# Patient Record
Sex: Female | Born: 1935
Health system: Southern US, Community
[De-identification: ages and names within clinical notes are randomized; demographics above are authoritative.]

## PROBLEM LIST (undated history)

## (undated) ENCOUNTER — Emergency Department (HOSPITAL_COMMUNITY): Admission: EM | Payer: PPO | Source: Home / Self Care

## (undated) DIAGNOSIS — I429 Cardiomyopathy, unspecified: Secondary | ICD-10-CM

## (undated) DIAGNOSIS — M21612 Bunion of left foot: Secondary | ICD-10-CM

## (undated) DIAGNOSIS — H3530 Unspecified macular degeneration: Secondary | ICD-10-CM

## (undated) DIAGNOSIS — D649 Anemia, unspecified: Secondary | ICD-10-CM

## (undated) DIAGNOSIS — M21611 Bunion of right foot: Secondary | ICD-10-CM

## (undated) DIAGNOSIS — Z789 Other specified health status: Secondary | ICD-10-CM

## (undated) DIAGNOSIS — F419 Anxiety disorder, unspecified: Secondary | ICD-10-CM

## (undated) DIAGNOSIS — F32A Depression, unspecified: Secondary | ICD-10-CM

## (undated) DIAGNOSIS — F329 Major depressive disorder, single episode, unspecified: Secondary | ICD-10-CM

## (undated) DIAGNOSIS — I509 Heart failure, unspecified: Secondary | ICD-10-CM

## (undated) DIAGNOSIS — I2510 Atherosclerotic heart disease of native coronary artery without angina pectoris: Secondary | ICD-10-CM

## (undated) DIAGNOSIS — M1990 Unspecified osteoarthritis, unspecified site: Secondary | ICD-10-CM

## (undated) DIAGNOSIS — I340 Nonrheumatic mitral (valve) insufficiency: Secondary | ICD-10-CM

## (undated) DIAGNOSIS — C801 Malignant (primary) neoplasm, unspecified: Secondary | ICD-10-CM

## (undated) DIAGNOSIS — M810 Age-related osteoporosis without current pathological fracture: Secondary | ICD-10-CM

## (undated) DIAGNOSIS — K5790 Diverticulosis of intestine, part unspecified, without perforation or abscess without bleeding: Secondary | ICD-10-CM

## (undated) DIAGNOSIS — R011 Cardiac murmur, unspecified: Secondary | ICD-10-CM

## (undated) DIAGNOSIS — I10 Essential (primary) hypertension: Secondary | ICD-10-CM

## (undated) DIAGNOSIS — T148XXA Other injury of unspecified body region, initial encounter: Secondary | ICD-10-CM

## (undated) DIAGNOSIS — I219 Acute myocardial infarction, unspecified: Secondary | ICD-10-CM

## (undated) DIAGNOSIS — S83206A Unspecified tear of unspecified meniscus, current injury, right knee, initial encounter: Secondary | ICD-10-CM

## (undated) DIAGNOSIS — E785 Hyperlipidemia, unspecified: Secondary | ICD-10-CM

## (undated) DIAGNOSIS — N390 Urinary tract infection, site not specified: Secondary | ICD-10-CM

## (undated) HISTORY — DX: Diverticulosis of intestine, part unspecified, without perforation or abscess without bleeding: K57.90

## (undated) HISTORY — DX: Major depressive disorder, single episode, unspecified: F32.9

## (undated) HISTORY — DX: Depression, unspecified: F32.A

## (undated) HISTORY — DX: Bunion of left foot: M21.612

## (undated) HISTORY — PX: SKIN CANCER EXCISION: SHX779

## (undated) HISTORY — DX: Age-related osteoporosis without current pathological fracture: M81.0

## (undated) HISTORY — DX: Bunion of right foot: M21.611

---

## 1978-01-30 HISTORY — PX: ABDOMINAL HYSTERECTOMY: SHX81

## 1984-01-31 HISTORY — PX: LUMBAR LAMINECTOMY: SHX95

## 1984-01-31 HISTORY — PX: BACK SURGERY: SHX140

## 1986-01-30 HISTORY — PX: ABDOMINAL HYSTERECTOMY: SHX81

## 2000-11-02 ENCOUNTER — Ambulatory Visit (HOSPITAL_COMMUNITY): Admission: RE | Admit: 2000-11-02 | Discharge: 2000-11-02 | Payer: Self-pay | Admitting: *Deleted

## 2003-08-24 ENCOUNTER — Ambulatory Visit (HOSPITAL_COMMUNITY): Admission: RE | Admit: 2003-08-24 | Discharge: 2003-08-24 | Payer: Self-pay | Admitting: Family Medicine

## 2005-05-29 ENCOUNTER — Other Ambulatory Visit: Admission: RE | Admit: 2005-05-29 | Discharge: 2005-05-29 | Payer: Self-pay | Admitting: Family Medicine

## 2005-09-18 ENCOUNTER — Emergency Department (HOSPITAL_COMMUNITY): Admission: EM | Admit: 2005-09-18 | Discharge: 2005-09-18 | Payer: Self-pay | Admitting: Emergency Medicine

## 2009-05-25 ENCOUNTER — Encounter: Admission: RE | Admit: 2009-05-25 | Discharge: 2009-05-25 | Payer: Self-pay | Admitting: Family Medicine

## 2011-01-31 DIAGNOSIS — T148XXA Other injury of unspecified body region, initial encounter: Secondary | ICD-10-CM

## 2011-01-31 HISTORY — PX: OTHER SURGICAL HISTORY: SHX169

## 2011-01-31 HISTORY — DX: Other injury of unspecified body region, initial encounter: T14.8XXA

## 2011-06-28 ENCOUNTER — Other Ambulatory Visit (HOSPITAL_COMMUNITY): Payer: Self-pay | Admitting: *Deleted

## 2011-06-28 ENCOUNTER — Other Ambulatory Visit: Payer: Self-pay | Admitting: Family Medicine

## 2011-07-11 ENCOUNTER — Encounter (HOSPITAL_COMMUNITY): Payer: Self-pay

## 2011-07-13 ENCOUNTER — Encounter (HOSPITAL_COMMUNITY)
Admission: RE | Admit: 2011-07-13 | Discharge: 2011-07-13 | Disposition: A | Discharge status: Home / Self Care | Payer: Medicare Other | Source: Ambulatory Visit | Attending: Family Medicine | Admitting: Family Medicine

## 2011-07-13 ENCOUNTER — Encounter (HOSPITAL_COMMUNITY): Payer: Self-pay

## 2011-07-13 DIAGNOSIS — M810 Age-related osteoporosis without current pathological fracture: Secondary | ICD-10-CM | POA: Insufficient documentation

## 2011-07-13 HISTORY — DX: Essential (primary) hypertension: I10

## 2011-07-13 MED ORDER — SODIUM CHLORIDE 0.9 % IV SOLN
INTRAVENOUS | Status: DC
  Administered 2011-07-13: 14:00:00 via INTRAVENOUS

## 2011-07-13 MED ORDER — ZOLEDRONIC ACID 5 MG/100ML IV SOLN
5.0000 mg | Freq: Once | INTRAVENOUS | Status: AC
  Administered 2011-07-13: 5 mg via INTRAVENOUS
  Filled 2011-07-13: qty 100

## 2011-07-13 NOTE — Progress Notes (Signed)
Patient given discharge instructions, drink fliud, take tylenol or motrin as needed

## 2011-07-13 NOTE — Discharge Instructions (Signed)
Zoledronic Acid injection (Paget's Disease, Osteoporosis) What is this medicine? ZOLEDRONIC ACID (ZOE le dron ik AS id) lowers the amount of calcium loss from bone. It is used to treat Paget's disease and osteoporosis in women. This medicine may be used for other purposes; ask your health care provider or pharmacist if you have questions. What should I tell my health care provider before I take this medicine? They need to know if you have any of these conditions: -aspirin-sensitive asthma -dental disease -kidney disease -low levels of calcium in the blood -past surgery on the parathyroid gland or intestines -an unusual or allergic reaction to zoledronic acid, other medicines, foods, dyes, or preservatives -pregnant or trying to get pregnant -breast-feeding How should I use this medicine? This medicine is for infusion into a vein. It is given by a health care professional in a hospital or clinic setting. Talk to your pediatrician regarding the use of this medicine in children. This medicine is not approved for use in children. Overdosage: If you think you have taken too much of this medicine contact a poison control center or emergency room at once. NOTE: This medicine is only for you. Do not share this medicine with others. What if I miss a dose? It is important not to miss your dose. Call your doctor or health care professional if you are unable to keep an appointment. What may interact with this medicine? -certain antibiotics given by injection -NSAIDs, medicines for pain and inflammation, like ibuprofen or naproxen -some diuretics like bumetanide, furosemide -teriparatide This list may not describe all possible interactions. Give your health care provider a list of all the medicines, herbs, non-prescription drugs, or dietary supplements you use. Also tell them if you smoke, drink alcohol, or use illegal drugs. Some items may interact with your medicine. What should I watch for while  using this medicine? Visit your doctor or health care professional for regular checkups. It may be some time before you see the benefit from this medicine. Do not stop taking your medicine unless your doctor tells you to. Your doctor may order blood tests or other tests to see how you are doing. Women should inform their doctor if they wish to become pregnant or think they might be pregnant. There is a potential for serious side effects to an unborn child. Talk to your health care professional or pharmacist for more information. You should make sure that you get enough calcium and vitamin D while you are taking this medicine. Discuss the foods you eat and the vitamins you take with your health care professional. Some people who take this medicine have severe bone, joint, and/or muscle pain. This medicine may also increase your risk for a broken thigh bone. Tell your doctor right away if you have pain in your upper leg or groin. Tell your doctor if you have any pain that does not go away or that gets worse. What side effects may I notice from receiving this medicine? Side effects that you should report to your doctor or health care professional as soon as possible: -allergic reactions like skin rash, itching or hives, swelling of the face, lips, or tongue -breathing problems -changes in vision -feeling faint or lightheaded, falls -jaw burning, cramping, or pain -muscle cramps, stiffness, or weakness -trouble passing urine or change in the amount of urine Side effects that usually do not require medical attention (report to your doctor or health care professional if they continue or are bothersome): -bone, joint, or muscle pain -fever -  irritation at site where injected -loss of appetite -nausea, vomiting -stomach upset -tired This list may not describe all possible side effects. Call your doctor for medical advice about side effects. You may report side effects to FDA at 1-800-FDA-1088. Where  should I keep my medicine? This drug is given in a hospital or clinic and will not be stored at home. NOTE: This sheet is a summary. It may not cover all possible information. If you have questions about this medicine, talk to your doctor, pharmacist, or health care provider.  2012, [employer]. (07/15/2010 9:08:15 AM) 

## 2012-03-08 ENCOUNTER — Encounter (HOSPITAL_COMMUNITY): Payer: Self-pay | Admitting: Emergency Medicine

## 2012-03-08 ENCOUNTER — Inpatient Hospital Stay (HOSPITAL_COMMUNITY)
Admission: EM | Admit: 2012-03-08 | Discharge: 2012-03-12 | DRG: 247 | Disposition: A | Discharge status: Home / Self Care | Payer: Medicare Other | Attending: Cardiology | Admitting: Cardiology

## 2012-03-08 ENCOUNTER — Emergency Department (HOSPITAL_COMMUNITY): Payer: Medicare Other

## 2012-03-08 DIAGNOSIS — I2789 Other specified pulmonary heart diseases: Secondary | ICD-10-CM | POA: Diagnosis present

## 2012-03-08 DIAGNOSIS — I272 Pulmonary hypertension, unspecified: Secondary | ICD-10-CM

## 2012-03-08 DIAGNOSIS — I214 Non-ST elevation (NSTEMI) myocardial infarction: Principal | ICD-10-CM | POA: Diagnosis present

## 2012-03-08 DIAGNOSIS — I340 Nonrheumatic mitral (valve) insufficiency: Secondary | ICD-10-CM

## 2012-03-08 DIAGNOSIS — R339 Retention of urine, unspecified: Secondary | ICD-10-CM

## 2012-03-08 DIAGNOSIS — I10 Essential (primary) hypertension: Secondary | ICD-10-CM | POA: Diagnosis present

## 2012-03-08 DIAGNOSIS — Z955 Presence of coronary angioplasty implant and graft: Secondary | ICD-10-CM

## 2012-03-08 DIAGNOSIS — F411 Generalized anxiety disorder: Secondary | ICD-10-CM | POA: Diagnosis present

## 2012-03-08 DIAGNOSIS — I429 Cardiomyopathy, unspecified: Secondary | ICD-10-CM

## 2012-03-08 DIAGNOSIS — Z79899 Other long term (current) drug therapy: Secondary | ICD-10-CM

## 2012-03-08 DIAGNOSIS — H3530 Unspecified macular degeneration: Secondary | ICD-10-CM | POA: Diagnosis present

## 2012-03-08 DIAGNOSIS — R079 Chest pain, unspecified: Secondary | ICD-10-CM

## 2012-03-08 DIAGNOSIS — Z7902 Long term (current) use of antithrombotics/antiplatelets: Secondary | ICD-10-CM

## 2012-03-08 DIAGNOSIS — Z7982 Long term (current) use of aspirin: Secondary | ICD-10-CM

## 2012-03-08 DIAGNOSIS — I2589 Other forms of chronic ischemic heart disease: Secondary | ICD-10-CM | POA: Diagnosis present

## 2012-03-08 DIAGNOSIS — Z882 Allergy status to sulfonamides status: Secondary | ICD-10-CM

## 2012-03-08 DIAGNOSIS — F419 Anxiety disorder, unspecified: Secondary | ICD-10-CM

## 2012-03-08 DIAGNOSIS — I059 Rheumatic mitral valve disease, unspecified: Secondary | ICD-10-CM | POA: Diagnosis present

## 2012-03-08 DIAGNOSIS — E785 Hyperlipidemia, unspecified: Secondary | ICD-10-CM

## 2012-03-08 DIAGNOSIS — I2510 Atherosclerotic heart disease of native coronary artery without angina pectoris: Secondary | ICD-10-CM | POA: Diagnosis present

## 2012-03-08 HISTORY — DX: Other specified health status: Z78.9

## 2012-03-08 HISTORY — DX: Urinary tract infection, site not specified: N39.0

## 2012-03-08 HISTORY — DX: Unspecified macular degeneration: H35.30

## 2012-03-08 HISTORY — DX: Cardiomyopathy, unspecified: I42.9

## 2012-03-08 HISTORY — DX: Hyperlipidemia, unspecified: E78.5

## 2012-03-08 HISTORY — DX: Anxiety disorder, unspecified: F41.9

## 2012-03-08 HISTORY — DX: Nonrheumatic mitral (valve) insufficiency: I34.0

## 2012-03-08 HISTORY — DX: Atherosclerotic heart disease of native coronary artery without angina pectoris: I25.10

## 2012-03-08 MED ORDER — NITROGLYCERIN 0.4 MG SL SUBL
0.4000 mg | SUBLINGUAL_TABLET | SUBLINGUAL | Status: DC | PRN
  Filled 2012-03-08: qty 25

## 2012-03-08 MED ORDER — MORPHINE SULFATE 4 MG/ML IJ SOLN
4.0000 mg | INTRAMUSCULAR | Status: DC | PRN
  Filled 2012-03-08 (×2): qty 1

## 2012-03-08 MED ORDER — ONDANSETRON HCL 4 MG/2ML IJ SOLN
4.0000 mg | Freq: Four times a day (QID) | INTRAMUSCULAR | Status: DC | PRN
  Filled 2012-03-08: qty 2

## 2012-03-08 MED ORDER — ASPIRIN 81 MG PO CHEW
162.0000 mg | CHEWABLE_TABLET | Freq: Once | ORAL | Status: DC
  Filled 2012-03-08: qty 4

## 2012-03-08 NOTE — ED Provider Notes (Signed)
History     CSN: [id number]  Arrival date & time 03/08/12  2322   First MD Initiated Contact with Patient 03/08/12 2332      Chief Complaint  Patient presents with  . Chest Pain    (Consider location/radiation/quality/duration/timing/severity/associated sxs/prior treatment) HPI HX per PT. Jaw pain bilateral and SOB onset while exercising around 6:30pm tonight, pain persistent eases with rest and worse with exertion, no h/o same, treated for HTN no known h/o CAD. Mod in severity, no CP, no nausea, no diaphoresis, no fevers, no leg pain or swelling.  Past Medical History  Diagnosis Date  . Hypertension     Past Surgical History  Procedure Date  . Abdominal hysterectomy   . Back surgery     No family history on file.  History  Substance Use Topics  . Smoking status: Not on file  . Smokeless tobacco: Not on file  . Alcohol Use:     OB History    Grav Para Term Preterm Abortions TAB SAB Ect Mult Living                  Review of Systems  Constitutional: Negative for fever and chills.  HENT: Negative for sore throat, trouble swallowing, neck pain, neck stiffness, dental problem and sinus pressure.   Eyes: Negative for pain.  Respiratory: Positive for shortness of breath.   Cardiovascular: Negative for palpitations and leg swelling.  Gastrointestinal: Negative for vomiting and abdominal pain.  Genitourinary: Negative for dysuria.  Musculoskeletal: Negative for back pain.  Skin: Negative for rash.  Neurological: Negative for headaches.  All other systems reviewed and are negative.    Allergies  Sulfa antibiotics  Home Medications   Current Outpatient Rx  Name  Route  Sig  Dispense  Refill  . ASCORBIC ACID 1000 MG PO TABS   Oral   Take 1,000 mg by mouth daily.         . ASPIRIN 81 MG PO TABS   Oral   Take 81 mg by mouth daily.         [name] VITAMIN D 1000 UNITS PO TABS   Oral   Take 2,000 Units by mouth daily.         . OMEGA-3 FATTY ACIDS 1000 MG  PO CAPS   Oral   Take 2 g by mouth daily.         [name] HYOSCYAMINE SULFATE 0.125 MG PO TBDP   Sublingual   Place 0.125 mg under the tongue every 6 (six) hours as needed. Before meals         . LISINOPRIL 5 MG PO TABS   Oral   Take 5 mg by mouth daily.         [name] ONE-DAILY MULTI VITAMINS PO TABS   Oral   Take 1 tablet by mouth daily.         [name] NITROFURANTOIN MONOHYD MACRO 100 MG PO CAPS   Oral   Take 100 mg by mouth every other day.           BP 131/87  Pulse 87  Temp 97.5 F (36.4 C) (Oral)  Resp 16  Ht 5\' 6"  (1.676 m)  Wt 160 lb (72.576 kg)  BMI 25.82 kg/m2  SpO2 99%  Physical Exam  Constitutional: She is oriented to person, place, and time. She appears well-developed and well-nourished.  HENT:  Head: Normocephalic and atraumatic.  Mouth/Throat: Oropharynx is clear and moist.       No  trismus  Eyes: Conjunctivae normal and EOM are normal. Pupils are equal, round, and reactive to light.  Neck: Trachea normal. Neck supple. No thyromegaly present.  Cardiovascular: Normal rate, regular rhythm, S1 normal, S2 normal and normal pulses.     No systolic murmur is present   No diastolic murmur is present  Pulses:      Radial pulses are 2+ on the right side, and 2+ on the left side.  Pulmonary/Chest: Effort normal and breath sounds normal. She has no wheezes. She has no rhonchi. She has no rales. She exhibits no tenderness.  Abdominal: Soft. Normal appearance and bowel sounds are normal. There is no tenderness. There is no CVA tenderness and negative Murphy's sign.  Musculoskeletal:       BLE:s Calves nontender, no cords or erythema, negative Homans sign  Neurological: She is alert and oriented to person, place, and time. She has normal strength. No cranial nerve deficit or sensory deficit. GCS eye subscore is 4. GCS verbal subscore is 5. GCS motor subscore is 6.  Skin: Skin is warm and dry. No rash noted. She is not diaphoretic.  Psychiatric: Her speech is normal.        Cooperative and appropriate    ED Course  Procedures (including critical care time)  Results for orders placed during the hospital encounter of 03/08/12  CBC      Result Value Range   WBC 9.5  4.0 - 10.5 K/uL   RBC 4.31  3.87 - 5.11 MIL/uL   Hemoglobin 13.6  12.0 - 15.0 g/dL   HCT 16.1  09.6 - 04.5 %   MCV 92.8  78.0 - 100.0 fL   MCH 31.6  26.0 - 34.0 pg   MCHC 34.0  30.0 - 36.0 g/dL   RDW 40.9  81.1 - 91.4 %   Platelets 196  150 - 400 K/uL  PRO B NATRIURETIC PEPTIDE      Result Value Range   Pro B Natriuretic peptide (BNP) 199.3  0 - 450 pg/mL  BASIC METABOLIC PANEL      Result Value Range   Sodium 138  135 - 145 mEq/L   Potassium 4.0  3.5 - 5.1 mEq/L   Chloride 102  96 - 112 mEq/L   CO2 24  19 - 32 mEq/L   Glucose, Bld 109 (*) 70 - 99 mg/dL   BUN 14  6 - 23 mg/dL   Creatinine, Ser 7.82  0.50 - 1.10 mg/dL   Calcium 9.1  8.4 - 95.6 mg/dL   GFR calc non Af Amer 58 (*) >90 mL/min   GFR calc Af Amer 67 (*) >90 mL/min  PROTIME-INR      Result Value Range   Prothrombin Time 11.5 (*) 11.6 - 15.2 seconds   INR 0.84  0.00 - 1.49  TROPONIN I      Result Value Range   Troponin I 1.96 (*) <0.30 ng/mL  POCT I-STAT, CHEM 8      Result Value Range   Sodium 140  135 - 145 mEq/L   Potassium 3.9  3.5 - 5.1 mEq/L   Chloride 108  96 - 112 mEq/L   BUN 15  6 - 23 mg/dL   Creatinine, Ser 2.13  0.50 - 1.10 mg/dL   Glucose, Bld 086 (*) 70 - 99 mg/dL   Calcium, Ion 5.78  4.69 - 1.30 mmol/L   TCO2 23  0 - 100 mmol/L   Hemoglobin 13.3  12.0 - 15.0 g/dL  HCT 39.0  36.0 - 46.0 %  POCT I-STAT TROPONIN I      Result Value Range   Troponin i, poc 0.64 (*) 0.00 - 0.08 ng/mL   Comment NOTIFIED PHYSICIAN     Comment 3            Dg Chest 1 View  03/09/2012  *RADIOLOGY REPORT*  Clinical Data: Chest pain  CHEST - 1 VIEW  Comparison: 08/24/2003  Findings: Chronic interstitial markings/emphysematous changes.  No focal consolidation.  No pleural effusion or pneumothorax.  The heart is top  normal in size.  IMPRESSION: No evidence of acute cardiopulmonary disease.   Original Report Authenticated By: Charline Bills, M.D.    CRITICAL CARE Performed by: Sunnie Nielsen   Total critical care time: 30  Critical care time was exclusive of separately billable procedures and treating other patients.  Critical care was necessary to treat or prevent imminent or life-threatening deterioration.  Critical care was time spent personally by me on the following activities: development of treatment plan with patient and/or surrogate as well as nursing, discussions with consultants, evaluation of patient's response to treatment, examination of patient, obtaining history from patient or surrogate, ordering and performing treatments and interventions, ordering and review of laboratory studies, ordering and review of radiographic studies, pulse oximetry and re-evaluation of patient's condition. ASA. IV Heparin. CR consult and Tx to Cardiac center      Date: 03/08/2012  Rate: 92  Rhythm: normal sinus rhythm  QRS Axis: normal  Intervals: normal  ST/T Wave abnormalities: nonspecific ST changes  Conduction Disutrbances:none  Narrative Interpretation:   Old EKG Reviewed: none available  ASA. Pain resolved with NTG. Troponin reviewed - d/w DR Hochrein who accepts in Tx to [facility].   PLAN ADMIT  - NSTEMI MDM   Elderly female with presentation concerning for unstable angina/ ACS.   ASA. NTG. Cardiac monitoring. IV. O2. labs. CXR. Plan Admit for ACS symptoms.      Sunnie Nielsen, MD 03/09/12 [phone]

## 2012-03-08 NOTE — ED Notes (Signed)
Pt states after exercise this evening @ 1830 pt began having central CP and lower bilat jaw pain with SHOB and lightheadedness. Pt recently lost husband.

## 2012-03-09 ENCOUNTER — Encounter (HOSPITAL_COMMUNITY): Payer: Self-pay | Admitting: Cardiology

## 2012-03-09 DIAGNOSIS — I214 Non-ST elevation (NSTEMI) myocardial infarction: Secondary | ICD-10-CM | POA: Diagnosis present

## 2012-03-09 LAB — POCT I-STAT, CHEM 8
BUN: 15 mg/dL (ref 6–23)
Calcium, Ion: 1.15 mmol/L (ref 1.13–1.30)
Chloride: 108 mEq/L (ref 96–112)
HCT: 39 % (ref 36.0–46.0)
TCO2: 23 mmol/L (ref 0–100)

## 2012-03-09 LAB — CBC
HCT: 36.5 % (ref 36.0–46.0)
Hemoglobin: 12.4 g/dL (ref 12.0–15.0)
Hemoglobin: 13.6 g/dL (ref 12.0–15.0)
MCH: 31.6 pg (ref 26.0–34.0)
MCHC: 34 g/dL (ref 30.0–36.0)
RBC: 3.93 MIL/uL (ref 3.87–5.11)
RDW: 13.4 % (ref 11.5–15.5)
WBC: 9.5 10*3/uL (ref 4.0–10.5)

## 2012-03-09 LAB — CBC WITH DIFFERENTIAL/PLATELET
Basophils Absolute: 0.1 10*3/uL (ref 0.0–0.1)
Basophils Relative: 1 % (ref 0–1)
Eosinophils Absolute: 0.3 10*3/uL (ref 0.0–0.7)
Eosinophils Relative: 3 % (ref 0–5)
HCT: 38.4 % (ref 36.0–46.0)
MCH: 31.3 pg (ref 26.0–34.0)
MCHC: 33.3 g/dL (ref 30.0–36.0)
Monocytes Absolute: 0.6 10*3/uL (ref 0.1–1.0)
Monocytes Relative: 8 % (ref 3–12)
Neutro Abs: 4.2 10*3/uL (ref 1.7–7.7)
RDW: 13.6 % (ref 11.5–15.5)

## 2012-03-09 LAB — BASIC METABOLIC PANEL
Calcium: 9.1 mg/dL (ref 8.4–10.5)
GFR calc non Af Amer: 58 mL/min — ABNORMAL LOW (ref >90)

## 2012-03-09 LAB — PRO B NATRIURETIC PEPTIDE: Pro B Natriuretic peptide (BNP): 199.3 pg/mL (ref 0–450)

## 2012-03-09 LAB — PROTIME-INR
INR: 0.84 (ref 0.00–1.49)
Prothrombin Time: 11.5 seconds — ABNORMAL LOW (ref 11.6–15.2)

## 2012-03-09 LAB — HEPARIN LEVEL (UNFRACTIONATED)
Heparin Unfractionated: 0.53 IU/mL (ref 0.30–0.70)
Heparin Unfractionated: 0.28 IU/mL — ABNORMAL LOW (ref 0.30–0.70)

## 2012-03-09 LAB — POCT I-STAT TROPONIN I: Troponin i, poc: 0.64 ng/mL (ref 0.00–0.08)

## 2012-03-09 MED ORDER — SODIUM CHLORIDE 0.9 % IJ SOLN: 3.0000 mL | INTRAMUSCULAR | Status: DC | PRN

## 2012-03-09 MED ORDER — SODIUM CHLORIDE 0.9 % IV SOLN: 250.0000 mL | INTRAVENOUS | Status: DC | PRN

## 2012-03-09 MED ORDER — SODIUM CHLORIDE 0.9 % IJ SOLN: 3.0000 mL | Freq: Two times a day (BID) | INTRAMUSCULAR | Status: DC

## 2012-03-09 MED ORDER — HEPARIN BOLUS VIA INFUSION: 4000.0000 [IU] | Freq: Once | INTRAVENOUS | Status: DC

## 2012-03-09 MED ORDER — HEPARIN (PORCINE) IN NACL 100-0.45 UNIT/ML-% IJ SOLN
1100.0000 [IU]/h | INTRAMUSCULAR | Status: DC
  Administered 2012-03-09 (×2): 1100 [IU]/h via INTRAVENOUS
  Filled 2012-03-09 (×4): qty 250

## 2012-03-09 MED ORDER — NITROGLYCERIN 0.4 MG SL SUBL: 0.4000 mg | SUBLINGUAL_TABLET | SUBLINGUAL | Status: DC | PRN

## 2012-03-09 MED ORDER — ATORVASTATIN CALCIUM 80 MG PO TABS
80.0000 mg | ORAL_TABLET | Freq: Every day | ORAL | Status: DC
  Administered 2012-03-09 – 2012-03-11 (×3): 80 mg via ORAL
  Filled 2012-03-09 (×4): qty 1

## 2012-03-09 MED ORDER — METOPROLOL TARTRATE 25 MG PO TABS
25.0000 mg | ORAL_TABLET | Freq: Two times a day (BID) | ORAL | Status: DC
  Administered 2012-03-09 – 2012-03-12 (×6): 25 mg via ORAL
  Filled 2012-03-09 (×8): qty 1

## 2012-03-09 MED ORDER — ACETAMINOPHEN 325 MG PO TABS
650.0000 mg | ORAL_TABLET | ORAL | Status: DC | PRN
  Administered 2012-03-09 – 2012-03-11 (×2): 650 mg via ORAL
  Filled 2012-03-09 (×2): qty 2

## 2012-03-09 NOTE — H&P (Signed)
CARDIOLOGY ADMISSION NOTE  Patient ID: Erin Roth MRN: [id number] DOB/AGE: 1933/05/24 79 y.o.  Admit date: 03/08/2012 Primary Physician   Cala Bradford, MD Primary Cardiologist   None Chief Complaint    Jaw pain  HPI:  The patient has no prior cardiac history. She has been under a lot of stress recently. Her husband died last month. She's only exercised a couple of since 2022-02-17 through last evening she was on her treadmill. She developed some jaw discomfort and some mid chest discomfort. This was 2/10 in intensity. It was heavy.  She was slightly lightheaded.  There were no associated symptoms such as nausea or diaphoresis. She did not have shortness of breath. She had no palpitations, presyncope or syncope. She presented to the emergency room where she was treated with heparin aspirin and sublingual nitroglycerin with resolution of her symptoms. She had no acute ST segment elevation diagnostic. Her troponin did come back mildly elevated. There was perhaps some minimal anterior ST change. She otherwise does not have any acute complaints. She doesn't typically get chest discomfort. She does not describe any PND or orthopnea.   Past Medical History  Diagnosis Date  . Hypertension   . Self-catheterizes urinary bladder   . UTI (lower urinary tract infection)   . Macular degeneration     Past Surgical History  Procedure Laterality Date  . Abdominal hysterectomy    . Back surgery      Allergies  Allergen Reactions  . Sulfa Antibiotics Rash   No current facility-administered medications on file prior to encounter.   Current Outpatient Prescriptions on File Prior to Encounter  Medication Sig Dispense Refill  . ascorbic acid (VITAMIN C) 1000 MG tablet Take 1,000 mg by mouth every morning.       [name] aspirin 81 MG tablet Take 162 mg by mouth every morning.       . cholecalciferol (VITAMIN D) 1000 UNITS tablet Take 2,000 Units by mouth every morning.       . fish oil-omega-3 fatty acids  1000 MG capsule Take 2 g by mouth every morning.       [name] lisinopril (PRINIVIL,ZESTRIL) 5 MG tablet Take 5 mg by mouth every morning.        History   Social History  . Marital Status: Married    Spouse Name: N/A    Number of Children: N/A  . Years of Education: N/A   Occupational History  . Not on file.   Social History Main Topics  . Smoking status: Never Smoker   . Smokeless tobacco: Not on file  . Alcohol Use: Yes     Comment: occasional  . Drug Use: No  . Sexually Active:    Other Topics Concern  . Not on file   Social History Narrative   Her husband died in 02-17-2022 of this year. She has four children.     Family History  Problem Relation Age of Onset  . Colon cancer Father   . Hypertension Mother   . CAD Brother 72    CABG    ROS:  Positive for dystonic bladder requiring self catheterizations. Otherwise as stated in the HPI and negative for all other systems.  Physical Exam: Blood pressure 104/90, pulse 85, temperature 97.5 F (36.4 C), temperature source Oral, resp. rate 11, height 5\' 6"  (1.676 m), weight 160 lb (72.576 kg), SpO2 99.00%.  GENERAL:  Well appearing HEENT:  Pupils equal round and reactive, fundi not visualized, oral mucosa unremarkable NECK:  No jugular venous distention, waveform within normal limits, carotid upstroke brisk and symmetric, no bruits, no thyromegaly LYMPHATICS:  No cervical, inguinal adenopathy LUNGS:  Clear to auscultation bilaterally BACK:  No CVA tenderness CHEST:  Unremarkable HEART:  PMI not displaced or sustained,S1 and S2 within normal limits, no S3, no S4, no clicks, no rubs, 6 apical systolic murmur nonradiating and early peaking, no diastolic murmurs ABD:  Flat, positive bowel sounds normal in frequency in pitch, no bruits, no rebound, no guarding, no midline pulsatile mass, no hepatomegaly, no splenomegaly EXT:  2 plus pulses throughout, no edema, no cyanosis no clubbing SKIN:  No rashes no nodules NEURO:  Cranial  nerves II through XII grossly intact, motor grossly intact throughout PSYCH:  Cognitively intact, oriented to person place and time  Labs: Lab Results  Component Value Date   BUN 15 03/09/2012   Lab Results  Component Value Date   CREATININE 1.00 03/09/2012   Lab Results  Component Value Date   NA 140 03/09/2012   K 3.9 03/09/2012   CL 108 03/09/2012   CO2 24 03/08/2012   Lab Results  Component Value Date   TROPONINI 1.96* 03/08/2012   Lab Results  Component Value Date   WBC 9.5 03/08/2012   HGB 13.3 03/09/2012   HCT 39.0 03/09/2012   MCV 92.8 03/08/2012   PLT 196 03/08/2012     Radiology:  CXR:  No evidence of acute cardiopulmonary disease.   EKG:    Normal sinus rhythm, rate 92, left knee enlargement addition, minimal voltage leads V2 - V5  ASSESSMENT AND PLAN:    NQWMI:  The patient is currently pain-free. She will remain on heparin. I will start a low dose beta blocker. She will be treated with aspirin. I will continue her previous medications. She will be left heart catheterization. The patient understands that risks included but are not limited to stroke (1 in 1000), death (1 in 1000), kidney failure [usually temporary] (1 in 500), bleeding (1 in 200), allergic reaction [possibly serious] (1 in 200).  The patient understands and agrees to proceed.   Of note this would be a classic situation for  Takatsubo's.  Of note I will hold the ACE as her BP is borderline.  Murmur:  I suspect aortic sclerosis and I will order an echocardiogram.    Signed: Rollene Rotunda 03/09/2012, 6:31 AM

## 2012-03-09 NOTE — Progress Notes (Signed)
  Echocardiogram 2D Echocardiogram has been performed.  Barbar Brede FRANCES 03/09/2012, 11:43 AM

## 2012-03-09 NOTE — Progress Notes (Signed)
ANTICOAGULATION CONSULT NOTE - Follow Up Consult  Pharmacy Consult for Heparin Indication: chest pain/ACS  Allergies  Allergen Reactions  . Sulfa Antibiotics Rash   Labs:  Recent Labs  03/08/12 2340  03/08/12 2348 03/09/12 0006 03/09/12 0245 03/09/12 0915  HGB  --   < > 13.6 13.3 12.4 12.8  HCT  --   < > 40.0 39.0 36.5 38.4  PLT  --   --  196  --  178 160  LABPROT  --   --  11.5*  --   --   --   INR  --   --  0.84  --   --   --   HEPARINUNFRC  --   --   --   --  0.53 0.28*  CREATININE  --   --  0.93 1.00  --   --   TROPONINI 1.96*  --   --   --  1.71*  --   < > = values in this interval not displayed.  Estimated Creatinine Clearance: 48 ml/min (by C-G formula based on Cr of 1).  Assessment: 79 year old admitted with CP.  HL = 0.28  Goal of Therapy:  Heparin level 0.3-0.7 units/ml Monitor platelets by anticoagulation protocol: Yes   Plan:  1) Increase heparin to 1100 units / hr 2) Follow up AM labs  Thank you. Okey Regal, PharmD [phone]  03/09/2012,2:04 PM

## 2012-03-09 NOTE — Progress Notes (Signed)
Pt arrived to unit at 0145 (during system downtime) via [facility]. Pt denies pain and SOB. VSS, daughter at bedside, pt resting. Will continue to monitor.

## 2012-03-10 LAB — CBC
HCT: 35.6 % — ABNORMAL LOW (ref 36.0–46.0)
Hemoglobin: 12 g/dL (ref 12.0–15.0)
MCV: 94.2 fL (ref 78.0–100.0)
WBC: 6.3 10*3/uL (ref 4.0–10.5)

## 2012-03-10 LAB — LIPID PANEL
Cholesterol: 179 mg/dL (ref 0–200)
LDL Cholesterol: 103 mg/dL — ABNORMAL HIGH (ref 0–99)
Total CHOL/HDL Ratio: 3.7 RATIO
Triglycerides: 141 mg/dL (ref <150)
VLDL: 28 mg/dL (ref 0–40)

## 2012-03-10 LAB — BASIC METABOLIC PANEL
BUN: 12 mg/dL (ref 6–23)
CO2: 21 mEq/L (ref 19–32)
Chloride: 111 mEq/L (ref 96–112)
GFR calc Af Amer: 79 mL/min — ABNORMAL LOW (ref >90)
Potassium: 3.8 mEq/L (ref 3.5–5.1)

## 2012-03-10 MED ORDER — SODIUM CHLORIDE 0.9 % IV SOLN: 250.0000 mL | INTRAVENOUS | Status: DC | PRN

## 2012-03-10 MED ORDER — ASPIRIN 81 MG PO CHEW
324.0000 mg | CHEWABLE_TABLET | ORAL | Status: AC
  Administered 2012-03-11: 324 mg via ORAL
  Filled 2012-03-10: qty 4

## 2012-03-10 MED ORDER — NITROFURANTOIN MONOHYD MACRO 100 MG PO CAPS
100.0000 mg | ORAL_CAPSULE | Freq: Every day | ORAL | Status: DC
  Administered 2012-03-10 – 2012-03-11 (×2): 100 mg via ORAL
  Filled 2012-03-10 (×3): qty 1

## 2012-03-10 NOTE — Progress Notes (Signed)
ANTICOAGULATION CONSULT NOTE - Follow Up Consult  Pharmacy Consult for Heparin Indication: chest pain/ACS  Allergies  Allergen Reactions  . Sulfa Antibiotics Rash   Labs:  Recent Labs  03/08/12 2340  03/08/12 2348 03/09/12 0006 03/09/12 0245 03/09/12 0915 03/10/12 0605  HGB  --   < > 13.6 13.3 12.4 12.8 12.0  HCT  --   < > 40.0 39.0 36.5 38.4 35.6*  PLT  --   < > 196  --  178 160 145*  LABPROT  --   --  11.5*  --   --   --   --   INR  --   --  0.84  --   --   --   --   HEPARINUNFRC  --   --   --   --  0.53 0.28* 0.48  CREATININE  --   --  0.93 1.00  --   --  0.81  TROPONINI 1.96*  --   --   --  1.71*  --   --   < > = values in this interval not displayed.  Estimated Creatinine Clearance: 59.3 ml/min (by C-G formula based on Cr of 0.81).  Assessment: 79 year old admitted with CP.  HL = 0.48, CBC stable  Cardiac cath planned for Monday  Goal of Therapy:  Heparin level 0.3-0.7 units/ml Monitor platelets by anticoagulation protocol: Yes   Plan:  1) Continue heparin at 1100 units / hr 2) Follow up AM labs  Thank you. Okey Regal, PharmD [phone]  03/10/2012,11:26 AM

## 2012-03-10 NOTE — Progress Notes (Signed)
Patient ID: Lonni Kemmer, female   DOB: 07/10/1933, 79 y.o.   MRN: [id number]   Patient Name: Erin Roth Date of Encounter: 03/10/2012    SUBJECTIVE  No further chest pain. Wants to get her antibiotic every other night her bladder. Also not happy with food. Daughter at the bedside.  CURRENT MEDS . atorvastatin  80 mg Oral q1800  . heparin  4,000 Units Intravenous Once  . metoprolol tartrate  25 mg Oral BID    OBJECTIVE  Filed Vitals:   03/09/12 2005 03/09/12 2038 03/09/12 2123 03/10/12 0449  BP: 84/47 104/64 96/66 108/64  Pulse: 78  72 64  Temp: 98.3 F (36.8 C)   97.4 F (36.3 C)  TempSrc: Oral   Oral  Resp: 18   19  Height:      Weight:      SpO2: 93%   95%    Intake/Output Summary (Last 24 hours) at 03/10/12 0916 Last data filed at 03/10/12 0454  Gross per 24 hour  Intake   1932 ml  Output   1800 ml  Net    132 ml   Filed Weights   03/08/12 2336  Weight: 160 lb (72.576 kg)    PHYSICAL EXAM  General: Pleasant, NAD. Neuro: Alert and oriented X 3. Moves all extremities spontaneously. Psych: Normal affect. HEENT:  Normal  Neck: Supple without bruits or JVD. Lungs:  Resp regular and unlabored, CTA. Heart: RRR no s3, s4, or murmurs. Abdomen: Soft, non-tender, non-distended, BS + x 4.  Extremities: No clubbing, cyanosis or edema. DP/PT/Radials 2+ and equal bilaterally.  Accessory Clinical Findings  CBC  Recent Labs  03/09/12 0245 03/09/12 0915 03/10/12 0605  WBC 9.5 7.7 6.3  NEUTROABS  --  4.2  --   HGB 12.4 12.8 12.0  HCT 36.5 38.4 35.6*  MCV 92.9 93.9 94.2  PLT 178 160 145*   Basic Metabolic Panel  Recent Labs  03/08/12 2348 03/09/12 0006 03/10/12 0605  NA 138 140 141  K 4.0 3.9 3.8  CL 102 108 111  CO2 24  --  21  GLUCOSE 109* 110* 88  BUN 14 15 12  CREATININE 0.93 1.00 0.81  CALCIUM 9.1  --  8.8   Liver Function Tests No results found for this basename: AST, ALT, ALKPHOS, BILITOT, PROT, ALBUMIN,  in the last 72 hours No results  found for this basename: LIPASE, AMYLASE,  in the last 72 hours Cardiac Enzymes  Recent Labs  03/08/12 2340 03/09/12 0245  TROPONINI 1.96* 1.71*   BNP No components found with this basename: POCBNP,  D-Dimer No results found for this basename: DDIMER,  in the last 72 hours Hemoglobin A1C No results found for this basename: HGBA1C,  in the last 72 hours Fasting Lipid Panel  Recent Labs  03/10/12 0605  CHOL 179  HDL 48  LDLCALC 103*  TRIG 141  CHOLHDL 3.7   Thyroid Function Tests  Recent Labs  03/09/12 0915  TSH 1.681    TELE  Normal sinus rhythm  ECG  Normal sinus rhythm, unremarkable ST segment change.  Radiology/Studies  Dg Chest 1 View  03/09/2012  *RADIOLOGY REPORT*  Clinical Data: Chest pain  CHEST - 1 VIEW  Comparison: 08/24/2003  Findings: Chronic interstitial markings/emphysematous changes.  No focal consolidation.  No pleural effusion or pneumothorax.  The heart is top normal in size.  IMPRESSION: No evidence of acute cardiopulmonary disease.   Original Report Authenticated By: Sriyesh Krishnan, M.D.     ASSESSMENT   AND PLAN  Principal Problem:   NSTEMI (non-ST elevated myocardial infarction)    Echocardiogram shows EF of 45% with anterior septal apical and inferior septal hypokinesia consistent with Takosubo's cardiomyopathy. EKG unremarkable however. We'll continue with current medical therapy and plan on cardiac catheterization tomorrow. Discussed with patient and daughter. So    Signed, Xavius Spadafore MD 

## 2012-03-10 NOTE — Progress Notes (Signed)
Utilization Review Completed.Erin Roth T2/09/2012  

## 2012-03-11 ENCOUNTER — Encounter (HOSPITAL_COMMUNITY)
Admission: EM | Disposition: A | Discharge status: Home / Self Care | Payer: Self-pay | Source: Home / Self Care | Attending: Cardiology

## 2012-03-11 DIAGNOSIS — R079 Chest pain, unspecified: Secondary | ICD-10-CM

## 2012-03-11 DIAGNOSIS — I2510 Atherosclerotic heart disease of native coronary artery without angina pectoris: Secondary | ICD-10-CM

## 2012-03-11 DIAGNOSIS — I214 Non-ST elevation (NSTEMI) myocardial infarction: Secondary | ICD-10-CM

## 2012-03-11 HISTORY — PX: LEFT HEART CATHETERIZATION WITH CORONARY ANGIOGRAM: SHX5451

## 2012-03-11 HISTORY — PX: CORONARY ANGIOPLASTY WITH STENT PLACEMENT: SHX49

## 2012-03-11 LAB — POCT I-STAT 3, VENOUS BLOOD GAS (G3P V)
Bicarbonate: 22.8 mEq/L (ref 20.0–24.0)
O2 Saturation: 66 %
TCO2: 24 mmol/L (ref 0–100)
pCO2, Ven: 37.5 mmHg — ABNORMAL LOW (ref 45.0–50.0)
pH, Ven: 7.393 — ABNORMAL HIGH (ref 7.250–7.300)

## 2012-03-11 LAB — CBC
MCH: 31.8 pg (ref 26.0–34.0)
MCHC: 34.1 g/dL (ref 30.0–36.0)
MCV: 93.2 fL (ref 78.0–100.0)
Platelets: 154 10*3/uL (ref 150–400)
RDW: 13.4 % (ref 11.5–15.5)

## 2012-03-11 LAB — POCT I-STAT 3, ART BLOOD GAS (G3+)
pCO2 arterial: 34 mmHg — ABNORMAL LOW (ref 35.0–45.0)
pH, Arterial: 7.435 (ref 7.350–7.450)
pO2, Arterial: 63 mmHg — ABNORMAL LOW (ref 80.0–100.0)

## 2012-03-11 SURGERY — LEFT HEART CATHETERIZATION WITH CORONARY ANGIOGRAM
Anesthesia: LOCAL

## 2012-03-11 MED ORDER — ASPIRIN 81 MG PO CHEW
81.0000 mg | CHEWABLE_TABLET | Freq: Every day | ORAL | Status: DC
  Administered 2012-03-12: 81 mg via ORAL
  Filled 2012-03-11: qty 1

## 2012-03-11 MED ORDER — LISINOPRIL 5 MG PO TABS
5.0000 mg | ORAL_TABLET | Freq: Every morning | ORAL | Status: DC
  Administered 2012-03-11 – 2012-03-12 (×2): 5 mg via ORAL
  Filled 2012-03-11 (×2): qty 1

## 2012-03-11 MED ORDER — SODIUM CHLORIDE 0.9 % IV SOLN
INTRAVENOUS | Status: AC
  Administered 2012-03-11: 18:00:00 via INTRAVENOUS

## 2012-03-11 MED ORDER — BIVALIRUDIN 250 MG IV SOLR
INTRAVENOUS | Status: AC
  Filled 2012-03-11: qty 250

## 2012-03-11 MED ORDER — MIDAZOLAM HCL 2 MG/2ML IJ SOLN
INTRAMUSCULAR | Status: AC
  Filled 2012-03-11: qty 2

## 2012-03-11 MED ORDER — NITROGLYCERIN 1 MG/10 ML FOR IR/CATH LAB
INTRA_ARTERIAL | Status: AC
  Filled 2012-03-11: qty 10

## 2012-03-11 MED ORDER — FENTANYL CITRATE 0.05 MG/ML IJ SOLN
INTRAMUSCULAR | Status: AC
  Filled 2012-03-11: qty 2

## 2012-03-11 MED ORDER — HEPARIN (PORCINE) IN NACL 2-0.9 UNIT/ML-% IJ SOLN
INTRAMUSCULAR | Status: AC
  Filled 2012-03-11: qty 1500

## 2012-03-11 MED ORDER — TICAGRELOR 90 MG PO TABS
ORAL_TABLET | ORAL | Status: AC
  Administered 2012-03-11: 23:00:00 90 mg via ORAL
  Filled 2012-03-11: qty 2

## 2012-03-11 MED ORDER — ALPRAZOLAM 0.25 MG PO TABS
0.5000 mg | ORAL_TABLET | Freq: Once | ORAL | Status: AC
  Administered 2012-03-11: 0.5 mg via ORAL

## 2012-03-11 MED ORDER — ALPRAZOLAM 0.25 MG PO TABS
ORAL_TABLET | ORAL | Status: AC
  Filled 2012-03-11: qty 2

## 2012-03-11 MED ORDER — TICAGRELOR 90 MG PO TABS
90.0000 mg | ORAL_TABLET | Freq: Two times a day (BID) | ORAL | Status: DC
  Administered 2012-03-11 – 2012-03-12 (×2): 90 mg via ORAL
  Filled 2012-03-11 (×3): qty 1

## 2012-03-11 MED ORDER — LIDOCAINE HCL (PF) 1 % IJ SOLN
INTRAMUSCULAR | Status: AC
  Filled 2012-03-11: qty 30

## 2012-03-11 NOTE — CV Procedure (Signed)
Cardiac Catheterization Procedure Note  Name: Erin Roth MRN: [id number] DOB: [birth year]  Procedure: Right Heart Cath, Left Heart Cath, Selective Coronary Angiography, LV angiography, a drug-eluting stent placement to the mid LAD.  Indication: Non-ST elevation myocardial infarction and pulmonary hypertension.   Procedural Details: The right groin was prepped, draped, and anesthetized with 1% lidocaine. Using the modified Seldinger technique a 5 French sheath was placed in the right femoral artery and a 7 French sheath was placed in the right femoral vein. A Swan-Ganz catheter was used for the right heart catheterization. Standard protocol was followed for recording of right heart pressures and sampling of oxygen saturations. Fick cardiac output was calculated. Standard Judkins catheters were used for selective coronary angiography and left ventriculography. There were no immediate procedural complications. The patient was transferred to the post catheterization recovery area for further monitoring.  Procedural Findings: Hemodynamics RA 4  mmHg RV   44/4 PCWP  a 15  mmHg LV  158/13  mmHg . LVEDP:  AO 155/88  mmHg  Oxygen saturations: PA  66% AO  93%   Cardiac Output (Fick)  5.3   Cardiac Index (Fick)  2.87   Aortic Valve: Peak to Peak gradient: no significant gradient  Coronary angiography: Coronary dominance: Right    Left Main:   normal in size and free of significant disease.  Left Anterior Descending (LAD):   normal in size with minor irregularities in the proximal segment. In the midsegment, there is a discrete 95% hazy stenosis suggestive of plaque rupture. The rest of the vessel has no obstructive disease.   1st diagonal (D1):  Small in size with minor irregularities.   2nd diagonal (D2):   large in size with no significant disease.   3rd diagonal (D3):   very small in size.   Circumflex (LCx):   normal in size and nondominant. The vessel has minor  irregularities without evidence of obstructive disease. It gives 3 normal size obtuse marginal branches.   Right Coronary Artery: Normal in size and dominant. The vessel has minor irregularities without obstructive disease.    Left ventriculography: Left ventricular systolic function is  mildly reduced  , LVEF is estimated at  40  %, there is  no significant mitral regurgitation .  there is moderate hypokinesis of distal anterior, apical and distal inferior walls    PCI Procedural Details: The 5 French sheath was exchanged into a 23 cm 6 French sheath. Weight-based bivalirudin was given for anticoagulation. Once a therapeutic ACT was achieved, a 6 [place] XB 3.5 guide catheter was inserted.  A intuition coronary guidewire was used to cross the lesion.   The lesion was then stented with a 2.5 x 12 mm Xience expedition drug-eluting stent.  The stent was postdilated with a 2.75 noncompliant balloon.  Following PCI, there was 0% residual stenosis and TIMI-3 flow. Final angiography confirmed an excellent result. The patient tolerated the procedure well. There were no immediate procedural complications. Femoral hemostasis was achieved with manual pressure. The patient was transferred to the post catheterization recovery area for further monitoring.  PCI Data: Vessel - mid LAD/Segment - 13 Percent Stenosis (pre)  95% TIMI-flow 3 Stent 2.5 x 12 mm Xience drug-eluting stent Percent Stenosis (post) 0% TIMI-flow (post) 3  Final Conclusions:  1. Mildly elevated filling pressures with mild pulmonary hypertension. Normal cardiac output. 2. Severe one-vessel coronary artery disease with 95% mid LAD stenosis likely the culprit for non-ST elevation MI. 3. Mildly reduced LV systolic function.  4. Successful drug-eluting stent placement to the mid LAD  Recommendations:  Recommend dual antiplatelet therapy for at least one year. Continue medical therapy for CAD and cardiomyopathy.  Lorine Bears MD,  [facility] 03/11/2012, 8:54 AM

## 2012-03-11 NOTE — Interval H&P Note (Signed)
History and Physical Interval Note:  03/11/2012 7:36 AM  Erin Roth  has presented today for surgery, with the diagnosis of cp  The various methods of treatment have been discussed with the patient and family. After consideration of risks, benefits and other options for treatment, the patient has consented to  Procedure(s): Right and LEFT HEART CATHETERIZATION WITH CORONARY ANGIOGRAM (N/A) as a surgical intervention .  The patient's history has been reviewed, patient examined, no change in status, stable for surgery.  I have reviewed the patient's chart and labs.  Questions were answered to the patient's satisfaction.     Lorine Bears

## 2012-03-11 NOTE — H&P (View-Only) (Signed)
Patient ID: Erin Roth, female   DOB: 1933/12/05, 79 y.o.   MRN: [id number]   Patient Name: Erin Roth Date of Encounter: 03/10/2012    SUBJECTIVE  No further chest pain. Wants to get her antibiotic every other night her bladder. Also not happy with food. Daughter at the bedside.  CURRENT MEDS . atorvastatin  80 mg Oral q1800  . heparin  4,000 Units Intravenous Once  . metoprolol tartrate  25 mg Oral BID    OBJECTIVE  Filed Vitals:   03/09/12 2005 03/09/12 2038 03/09/12 2123 03/10/12 0449  BP: 84/47 104/64 96/66 108/64  Pulse: 78  72 64  Temp: 98.3 F (36.8 C)   97.4 F (36.3 C)  TempSrc: Oral   Oral  Resp: 18   19  Height:      Weight:      SpO2: 93%   95%    Intake/Output Summary (Last 24 hours) at 03/10/12 0916 Last data filed at 03/10/12 0454  Gross per 24 hour  Intake   1932 ml  Output   1800 ml  Net    132 ml   Filed Weights   03/08/12 2336  Weight: 160 lb (72.576 kg)    PHYSICAL EXAM  General: Pleasant, NAD. Neuro: Alert and oriented X 3. Moves all extremities spontaneously. Psych: Normal affect. HEENT:  Normal  Neck: Supple without bruits or JVD. Lungs:  Resp regular and unlabored, CTA. Heart: RRR no s3, s4, or murmurs. Abdomen: Soft, non-tender, non-distended, BS + x 4.  Extremities: No clubbing, cyanosis or edema. DP/PT/Radials 2+ and equal bilaterally.  Accessory Clinical Findings  CBC  Recent Labs  03/09/12 0245 03/09/12 0915 03/10/12 0605  WBC 9.5 7.7 6.3  NEUTROABS  --  4.2  --   HGB 12.4 12.8 12.0  HCT 36.5 38.4 35.6*  MCV 92.9 93.9 94.2  PLT 178 160 145*   Basic Metabolic Panel  Recent Labs  03/08/12 2348 03/09/12 0006 03/10/12 0605  NA 138 140 141  K 4.0 3.9 3.8  CL 102 108 111  CO2 24  --  21  GLUCOSE 109* 110* 88  BUN 14 15 12   CREATININE 0.93 1.00 0.81  CALCIUM 9.1  --  8.8   Liver Function Tests No results found for this basename: AST, ALT, ALKPHOS, BILITOT, PROT, ALBUMIN,  in the last 72 hours No results  found for this basename: LIPASE, AMYLASE,  in the last 72 hours Cardiac Enzymes  Recent Labs  03/08/12 2340 03/09/12 0245  TROPONINI 1.96* 1.71*   BNP No components found with this basename: POCBNP,  D-Dimer No results found for this basename: DDIMER,  in the last 72 hours Hemoglobin A1C No results found for this basename: HGBA1C,  in the last 72 hours Fasting Lipid Panel  Recent Labs  03/10/12 0605  CHOL 179  HDL 48  LDLCALC 103*  TRIG 141  CHOLHDL 3.7   Thyroid Function Tests  Recent Labs  03/09/12 0915  TSH 1.681    TELE  Normal sinus rhythm  ECG  Normal sinus rhythm, unremarkable ST segment change.  Radiology/Studies  Dg Chest 1 View  03/09/2012  *RADIOLOGY REPORT*  Clinical Data: Chest pain  CHEST - 1 VIEW  Comparison: 08/24/2003  Findings: Chronic interstitial markings/emphysematous changes.  No focal consolidation.  No pleural effusion or pneumothorax.  The heart is top normal in size.  IMPRESSION: No evidence of acute cardiopulmonary disease.   Original Report Authenticated By: Charline Bills, M.D.     ASSESSMENT  AND PLAN  Principal Problem:   NSTEMI (non-ST elevated myocardial infarction)    Echocardiogram shows EF of 45% with anterior septal apical and inferior septal hypokinesia consistent with Takosubo's cardiomyopathy. EKG unremarkable however. We'll continue with current medical therapy and plan on cardiac catheterization tomorrow. Discussed with patient and daughter. So    Signed, Valera Castle MD

## 2012-03-12 ENCOUNTER — Encounter (HOSPITAL_COMMUNITY): Payer: Self-pay | Admitting: Physician Assistant

## 2012-03-12 DIAGNOSIS — I272 Pulmonary hypertension, unspecified: Secondary | ICD-10-CM

## 2012-03-12 DIAGNOSIS — I429 Cardiomyopathy, unspecified: Secondary | ICD-10-CM

## 2012-03-12 DIAGNOSIS — R339 Retention of urine, unspecified: Secondary | ICD-10-CM

## 2012-03-12 DIAGNOSIS — I10 Essential (primary) hypertension: Secondary | ICD-10-CM

## 2012-03-12 DIAGNOSIS — I2510 Atherosclerotic heart disease of native coronary artery without angina pectoris: Secondary | ICD-10-CM

## 2012-03-12 DIAGNOSIS — I340 Nonrheumatic mitral (valve) insufficiency: Secondary | ICD-10-CM

## 2012-03-12 DIAGNOSIS — F419 Anxiety disorder, unspecified: Secondary | ICD-10-CM

## 2012-03-12 DIAGNOSIS — E785 Hyperlipidemia, unspecified: Secondary | ICD-10-CM

## 2012-03-12 LAB — BASIC METABOLIC PANEL
CO2: 23 mEq/L (ref 19–32)
Calcium: 9.6 mg/dL (ref 8.4–10.5)
GFR calc non Af Amer: 79 mL/min — ABNORMAL LOW (ref >90)
Potassium: 3.9 mEq/L (ref 3.5–5.1)
Sodium: 141 mEq/L (ref 135–145)

## 2012-03-12 LAB — CBC
MCH: 31.8 pg (ref 26.0–34.0)
MCHC: 34.5 g/dL (ref 30.0–36.0)
Platelets: 170 10*3/uL (ref 150–400)
RBC: 4.28 MIL/uL (ref 3.87–5.11)

## 2012-03-12 MED ORDER — NITROGLYCERIN 0.4 MG SL SUBL: 0.4000 mg | SUBLINGUAL_TABLET | SUBLINGUAL | Status: DC | PRN

## 2012-03-12 MED ORDER — ALPRAZOLAM 0.5 MG PO TABS: 0.5000 mg | ORAL_TABLET | Freq: Every evening | ORAL | Status: DC | PRN

## 2012-03-12 MED ORDER — TICAGRELOR 90 MG PO TABS: 90.0000 mg | ORAL_TABLET | Freq: Two times a day (BID) | ORAL | Status: DC

## 2012-03-12 MED ORDER — METOPROLOL TARTRATE 25 MG PO TABS: 25.0000 mg | ORAL_TABLET | Freq: Two times a day (BID) | ORAL | Status: DC

## 2012-03-12 MED ORDER — ATORVASTATIN CALCIUM 80 MG PO TABS: 80.0000 mg | ORAL_TABLET | Freq: Every day | ORAL | Status: DC

## 2012-03-12 MED FILL — Dextrose Inj 5%: INTRAVENOUS | Qty: 50 | Status: AC

## 2012-03-12 NOTE — Discharge Summary (Signed)
Discharge Summary   Patient ID: Erin Roth,  MRN: [id number], DOB/AGE: [birth year] 79 y.o.  Admit date: 03/08/2012 Discharge date: 03/12/2012  Primary Physician: Cala Bradford, MD Primary Cardiologist: New- seen in consultation by Dr. Antoine Poche  Discharge Diagnoses Principal Problem:   NSTEMI (non-ST elevated myocardial infarction)  - s/p [facility] 03/11/12: 95% mid LAD stenosis s/p DES, minimal nonobs dz elsewhere; LVEF 40%, moderate hypokinesis of distal anterior, apical and distal inferior walls, mildly elevated filling pressures, mild pulmonary HTN  - ASA/Brilinta x 12 months Active Problems:   Mild pulmonary hypertension   CAD (coronary artery disease), native coronary artery  - Discharged on ASA, Brilinta, ARB, BB, statin, NTG SL PRN   Cardiomyopathy  - Suspect ischemic-related vs stress-induced; LVEF 45-50%, grade 2 diastolic dysfunction, distalanteroseptal, anterolateral, inferolateral, inferoseptal, and apical myocardium AK   Mild mitral regurgitation  - Repeat echo in 1 year   Anxiety  - Discharged on 30 days of Xanax 0.5 mg PRN, to follow-up with PCP   Hyperlipidemia  - Started on high-dose atorvastatin   - Check LFTs in 6 weeks   Urinary retention  - To continue outpatient Macrobid   Essential hypertension  - Started on Lopressor, well-controlled through admission   Allergies Allergies  Allergen Reactions  . Sulfa Antibiotics Rash    Diagnostic Studies/Procedures  PORTABLE CHEST X-RAY - 03/08/12  IMPRESSION: No evidence of acute cardiopulmonary disease.  TRANSTHORACIC ECHOCARDIOGRAM - 03/09/12  - Left ventricle: The cavity size was normal. There was mild focal basal hypertrophy of the septum. Systolic function was mildly reduced. The estimated ejection fraction was in the range of 45% to 50%. There is akinesis of the distalanteroseptal, anterolateral, inferolateral, inferoseptal, and apical myocardium. Features are consistent with a pseudonormal left ventricular  filling pattern, with concomitant abnormal relaxation and increased filling pressure (grade 2 diastolic dysfunction). - Mitral valve: Mild regurgitation directed eccentrically and toward the free wall.  - Pulmonary arteries: Systolic pressure was moderately increased. PA peak pressure: 52mm Hg (S).  RIGHT AND LEFT CARDIAC CATHETERIZATION + PERCUTANEOUS CORONARY INTERVENTION - 03/11/12  Hemodynamics  RA 4 mmHg  RV 44/4  PCWP a 15 mmHg  LV 158/13 mmHg . LVEDP:  AO 155/88 mmHg  Oxygen saturations:  PA 66%  AO 93%  Cardiac Output (Fick) 5.3  Cardiac Index (Fick) 2.87  Aortic Valve: Peak to Peak gradient: no significant gradient   Coronary angiography:  Coronary dominance: Right  Left Main: normal in size and free of significant disease.  Left Anterior Descending (LAD): normal in size with minor irregularities in the proximal segment. In the midsegment, there is a discrete 95% hazy stenosis suggestive of plaque rupture. The rest of the vessel has no obstructive disease.  1st diagonal (D1): Small in size with minor irregularities.  2nd diagonal (D2): large in size with no significant disease.  3rd diagonal (D3): very small in size.  Circumflex (LCx): normal in size and nondominant. The vessel has minor irregularities without evidence of obstructive disease. It gives 3 normal size obtuse marginal branches.  Right Coronary Artery: Normal in size and dominant. The vessel has minor irregularities without obstructive disease.   Left ventriculography: Left ventricular systolic function is mildly reduced , LVEF is estimated at 40 %, there is no significant mitral regurgitation . there is moderate hypokinesis of distal anterior, apical and distal inferior walls   PCI Data:  Vessel - mid LAD/Segment - 13  Percent Stenosis (pre) 95%  TIMI-flow 3  Stent 2.5 x 12 mm  Xience drug-eluting stent  Percent Stenosis (post) 0%  TIMI-flow (post) 3   Final Conclusions:  1. Mildly elevated filling  pressures with mild pulmonary hypertension. Normal cardiac output.  2. Severe one-vessel coronary artery disease with 95% mid LAD stenosis likely the culprit for non-ST elevation MI.  3. Mildly reduced LV systolic function.  4. Successful drug-eluting stent placement to the mid LAD  History of Present Illness   Erin Roth is a 79 y.o. female who was admitted to [facility] on 03/09/12 with the above problem list. She has no prior cardiac history. She had admittedly been under a lot of stress recently with the recent death of her husband. Shortly after exercising on the treadmill, she developed jaw discomfort and mid chest discomfort rated at 2/10 described as heavy with associated lightheadedness and shortness of breath. No prior exertional chest pain, PND or orthopnea. She subsequently presented to the emergency room she was treated with heparin, aspirin and sublingual nitroglycerin. EKG revealed no acute ST segment elevation, but did indicate some minimal anterior ST changes. Her troponin did return mildly elevated. Chest x-ray as above revealed no evidence of cardiopulmonary process. She was started on a low-dose beta blocker, high-dose statin and her ACE inhibitor was initially held as her blood pressure was borderline. Plan was made to pursue a right and left heart catheterization that following Monday.  Hospital Course   A cardiac murmur was auscultated on examination, and a 2-D echocardiogram was subsequently ordered. This revealed the above findings. There was a suspicion that her clinical scenario represented Takotsubo's cardiomyopathy. She remained stable throughout the admission. TSH returned WNL. On Monday 03/11/12, she was informed, consented and prepped for cardiac catheterization. She was somewhat anxious prior to the procedure, and was given Xanax with improvement. As above, this did reveal a 95% hazy mid LAD lesion concerning for plaque rupture. LVEF was quantified at 40%. There  were wall motion abnormalities as noted above, mildly elevated filling pressures and mild pulmonary hypertension. She tolerated the procedure well without complications. The recommendation was made to proceed with antiplatelet therapy for at least one year in the form of Aspirin and Brilinta. She remained stable in this CRU overnight. She was evaluated by Dr. Antoine Poche today, and deemed stable for discharge after ambulating with cardiac rehabilitation. She will followup within 7 days given her post NSTEMI status, as noted below. LFTs will be checked in 6 weeks for statin initiation. ACEi will be resumed. She will be started on 30 days of Xanax for anxiety, to be further managed by her PCP. She will otherwise continue the medications listed below. This information, including post-cath instructions, has been clearly outlined the discharge AVS.   Discharge Vitals:  Blood pressure 130/72, pulse 69, temperature 97.6 F (36.4 C), temperature source Oral, resp. rate 13, height 5\' 6"  (1.676 m), weight 160 lb 11.5 oz (72.9 kg), SpO2 97.00%.   Labs: Recent Labs     03/11/12  0500  03/12/12  0620  WBC  8.0  7.6  HGB  12.6  13.6  HCT  36.9  39.4  MCV  93.2  92.1  PLT  154  170   Recent Labs Lab 03/08/12 2348 03/09/12 0006 03/10/12 0605 03/12/12 0620  NA 138 140 141 141  K 4.0 3.9 3.8 3.9  CL 102 108 111 107  CO2 24  --  21 23  BUN 14 15 12 12   CREATININE 0.93 1.00 0.81 0.76  CALCIUM 9.1  --  8.8  9.6  GLUCOSE 109* 110* 88 91   Recent Labs     03/10/12  0605  CHOL  179  HDL  48  LDLCALC  103*  TRIG  141  CHOLHDL  3.7    Recent Labs  03/09/12 0915  TSH 1.681   Disposition:  Discharge Orders   Future Appointments Provider Department Dept Phone   03/18/2012 1:30 PM Rosalio Macadamia, NP [facility] Main Office [place]) [phone]   Future Orders Complete By Expires     Diet - low sodium heart healthy  As directed     Increase activity slowly  As directed             Follow-up Information   Follow up with Jacolyn Reedy, PA On 03/18/2012. (At 1:30 PM for follow-up.)    Contact information:   [address] [place], [place] [address] [place] [address] [phone]       Schedule an appointment as soon as possible for a visit with WHITE,CYNTHIA S, MD. (In 1-2 weeks for post-hospital follow-up and stress/anxiety management. )    Contact information:   [address] [place] [address] [phone]       Discharge Medications:    Medication List    TAKE these medications       ALPRAZolam 0.5 MG tablet  Commonly known as:  XANAX  Take 1 tablet (0.5 mg total) by mouth at bedtime as needed for sleep.     ascorbic acid 1000 MG tablet  Commonly known as:  VITAMIN C  Take 1,000 mg by mouth every morning.     aspirin 81 MG tablet  Take 162 mg by mouth every morning.     atorvastatin 80 MG tablet  Commonly known as:  LIPITOR  Take 1 tablet (80 mg total) by mouth daily at 6 PM.     cholecalciferol 1000 UNITS tablet  Commonly known as:  VITAMIN D  Take 2,000 Units by mouth every morning.     CRANBERRY PO  Take 1 capsule by mouth every morning.     fish oil-omega-3 fatty acids 1000 MG capsule  Take 2 g by mouth every morning.     ICAPS Caps  Take 1 capsule by mouth every morning.     lisinopril 5 MG tablet  Commonly known as:  PRINIVIL,ZESTRIL  Take 5 mg by mouth every morning.     metoprolol tartrate 25 MG tablet  Commonly known as:  LOPRESSOR  Take 1 tablet (25 mg total) by mouth 2 (two) times daily.     nitroGLYCERIN 0.4 MG SL tablet  Commonly known as:  NITROSTAT  Place 1 tablet (0.4 mg total) under the tongue every 5 (five) minutes x 3 doses as needed for chest pain.     Ticagrelor 90 MG Tabs tablet  Commonly known as:  BRILINTA  Take 1 tablet (90 mg total) by mouth 2 (two) times daily.       Outstanding Labs/Studies: LFTs in 6 weeks, repeat 2D echo in 1 year  Duration of Discharge Encounter:  Greater than 30 minutes including physician time.  Signed, R. Hurman Horn, PA-C 03/12/2012, 9:14 AM  Patient seen and examined.  Plan as discussed in my rounding note for today and outlined above. Fayrene Fearing [facility]  03/12/2012  10:47 AM

## 2012-03-12 NOTE — Progress Notes (Signed)
   SUBJECTIVE:  No chest pain.  No SOB.   PHYSICAL EXAM Filed Vitals:   03/11/12 2048 03/11/12 2255 03/11/12 2353 03/12/12 0338  BP:  118/70 112/72 124/81  Pulse:  76 72 72  Temp: 98.1 F (36.7 C)  97.4 F (36.3 C) 97.7 F (36.5 C)  TempSrc: Oral  Oral Oral  Resp:   22 18  Height:      Weight:    160 lb 11.5 oz (72.9 kg)  SpO2: 94%  96% 96%   General:  No distress Lungs:  Clear Heart:  RRR Abdomen:  Positive bowel sounds, no rebound no guarding Extremities:  No edema, right leg with echymosis but no hematoma or bruit.   LABS: Lab Results  Component Value Date   TROPONINI 1.71* 03/09/2012   Results for orders placed during the hospital encounter of 03/08/12 (from the past 24 hour(s))  POCT I-STAT 3, BLOOD GAS (G3+)     Status: Abnormal   Collection Time    03/11/12  7:57 AM      Result Value Range   pH, Arterial 7.435  7.350 - 7.450   pCO2 arterial 34.0 (*) 35.0 - 45.0 mmHg   pO2, Arterial 63.0 (*) 80.0 - 100.0 mmHg   Bicarbonate 22.8  20.0 - 24.0 mEq/L   TCO2 24  0 - 100 mmol/L   O2 Saturation 93.0     Acid-base deficit 1.0  0.0 - 2.0 mmol/L   Sample type ARTERIAL    POCT I-STAT 3, BLOOD GAS (G3P V)     Status: Abnormal   Collection Time    03/11/12  8:00 AM      Result Value Range   pH, Ven 7.393 (*) 7.250 - 7.300   pCO2, Ven 37.5 (*) 45.0 - 50.0 mmHg   pO2, Ven 34.0  30.0 - 45.0 mmHg   Bicarbonate 22.8  20.0 - 24.0 mEq/L   TCO2 24  0 - 100 mmol/L   O2 Saturation 66.0     Acid-base deficit 2.0  0.0 - 2.0 mmol/L   Sample type VENOUS     Comment NOTIFIED PHYSICIAN    POCT ACTIVATED CLOTTING TIME     Status: None   Collection Time    03/11/12  8:23 AM      Result Value Range   Activated Clotting Time 437      Intake/Output Summary (Last 24 hours) at 03/12/12 0630 Last data filed at 03/12/12 0339  Gross per 24 hour  Intake 1688.33 ml  Output   3200 ml  Net -1511.67 ml    EKG:    NSR, rate 71, deep T wave inversion in III, aVF, V3 - V6 with QT  prolongation not changed from previous. 03/12/2012  ASSESSMENT AND PLAN:   NSTEMI (non-ST elevated myocardial infarction):  Status post DES to LAD.    Continue current meds.  Ambulate with cardiac rehab.  OK to discharge to day.   Mitral regurgitation:  I will follow in one year with an echo.  Risk reduction:  Continue statin.  Mildly reduced EF:  I will titrate meds as an outpatient.      Fayrene Fearing [facility] 03/12/2012 6:30 AM

## 2012-03-12 NOTE — Progress Notes (Signed)
CARDIAC REHAB PHASE I   PRE:  Rate/Rhythm: 78 SR    BP: sitting 131/76    SaO2:   MODE:  Ambulation: 450 ft   POST:  Rate/Rhythm: 106 ST    BP: sitting 140/74     SaO2:   Tolerated well, no c/o. Ed completed with daughters present. Good reception and requests her name be sent to [facility].  [id number]  Harriet Masson CES, ACSM

## 2012-03-12 NOTE — Care Management Note (Signed)
    Page 1 of 1   03/12/2012     11:36:47 AM   CARE MANAGEMENT NOTE 03/12/2012  Patient:  Erin Roth, Erin Roth   Account Number:  [id number]  Date Initiated:  03/12/2012  Documentation initiated by:  [facility]  Subjective/Objective Assessment:   The patient is a very pleasant middle aged woman with longstanding HTN and renal insufficiency with a baseline creatinine of 2.8-3.2.  Admitted to the hospital with worsening CHF symptoms and found to have minimal troponin elevation,     Action/Plan:   CM consult for benefits check for Brilinta   Anticipated DC Date:  03/12/2012   Anticipated DC Plan:  HOME/SELF CARE      DC Planning Services  CM consult      Choice offered to / List presented to:             Status of service:   Medicare Important Message given?   (If response is "NO", the following Medicare IM given date fields will be blank) Date Medicare IM given:   Date Additional Medicare IM given:    Discharge Disposition:    Per UR Regulation:    If discussed at Long Length of Stay Meetings, dates discussed:    Comments:  03/12/12 @ 1045.Marland KitchenMarland KitchenOletta Cohn, RN, BSN, [place] [phone] Pt given Brilinta 30 free card.  Called [facility] to confirm their supply of Brilinta prior to pt D/C home.  Pt aware of $40 copay at preferred pharmacy and $45 of unpreferred pharmacy.

## 2012-03-18 ENCOUNTER — Encounter: Payer: Medicare Other | Admitting: Nurse Practitioner

## 2012-03-20 ENCOUNTER — Encounter: Payer: Medicare Other | Admitting: Nurse Practitioner

## 2012-03-21 ENCOUNTER — Encounter: Payer: Self-pay | Admitting: *Deleted

## 2012-03-21 ENCOUNTER — Ambulatory Visit (INDEPENDENT_AMBULATORY_CARE_PROVIDER_SITE_OTHER): Payer: Medicare Other | Admitting: Physician Assistant

## 2012-03-21 ENCOUNTER — Encounter: Payer: Self-pay | Admitting: Physician Assistant

## 2012-03-21 VITALS — BP 106/70 | HR 72 | Ht 66.0 in | Wt 157.0 lb

## 2012-03-21 DIAGNOSIS — E785 Hyperlipidemia, unspecified: Secondary | ICD-10-CM

## 2012-03-21 DIAGNOSIS — R002 Palpitations: Secondary | ICD-10-CM

## 2012-03-21 DIAGNOSIS — I10 Essential (primary) hypertension: Secondary | ICD-10-CM

## 2012-03-21 DIAGNOSIS — I429 Cardiomyopathy, unspecified: Secondary | ICD-10-CM

## 2012-03-21 DIAGNOSIS — I428 Other cardiomyopathies: Secondary | ICD-10-CM

## 2012-03-21 DIAGNOSIS — I2510 Atherosclerotic heart disease of native coronary artery without angina pectoris: Secondary | ICD-10-CM

## 2012-03-21 LAB — CBC WITH DIFFERENTIAL/PLATELET
Basophils Absolute: 0.1 10*3/uL (ref 0.0–0.1)
Eosinophils Absolute: 0.1 10*3/uL (ref 0.0–0.7)
Eosinophils Relative: 1.9 % (ref 0.0–5.0)
HCT: 41 % (ref 36.0–46.0)
Lymphs Abs: 1.6 10*3/uL (ref 0.7–4.0)
MCHC: 34.3 g/dL (ref 30.0–36.0)
MCV: 92.2 fl (ref 78.0–100.0)
Monocytes Absolute: 0.8 10*3/uL (ref 0.1–1.0)
Platelets: 266 10*3/uL (ref 150.0–400.0)
RDW: 13.2 % (ref 11.5–14.6)

## 2012-03-21 LAB — BASIC METABOLIC PANEL
BUN: 28 mg/dL — ABNORMAL HIGH (ref 6–23)
CO2: 26 mEq/L (ref 19–32)
Chloride: 105 mEq/L (ref 96–112)
Glucose, Bld: 92 mg/dL (ref 70–99)
Potassium: 4.2 mEq/L (ref 3.5–5.1)

## 2012-03-21 NOTE — Patient Instructions (Addendum)
FASTING LIPID AND LIVER PANEL TO BE DONE IN ABOUT 6 WEEKS CAN BE DONE THE SAME DAY YOU SEE DR. HOCHREIN IN ABOUT 6 WEEKS  YOU HAVE BEEN GIVEN A WORK NOTE YOU HAVE BEEN GIVEN A NOTE FOR YOUR GYM MEMBERSHIP TO BE PUT ON HOLD UNTIL YOU COMPLETE CARDIAC REHAB  LABS TODAY; BMET, CBC W/DIFF  NO MEDICATION CHANGES WERE MADE TODAY

## 2012-03-21 NOTE — Progress Notes (Signed)
[address] [place], [place]  [address] Phone: [phone], Fax:  [phone]  Date:  03/21/2012   ID:  Erin Roth, DOB 12-17-1933, MRN [id number]  PCP:  Cala Bradford, MD  Primary Cardiologist:  Dr. Rollene Rotunda     History of Present Illness: Erin Roth is a 79 y.o. female who returns for follow up after a recent admission to the hospital for non-STEMI.  She has a history of hypertension. Husband died recently and she has been under a lot of stress. She presented with onset of jaw and chest discomfort after exercise. Cardiac markers were normal.  Echo 03/09/12: Mild focal basal hypertrophy the septum, EF 45-50%, distal anteroseptal, anterolateral, inferolateral, inferoseptal and apical HK, grade 2 diastolic dysfunction, mild MR, PASP 52. There was a question of Tako-tsubo CM given her wall motion abnormalities. LHC 03/08/12: Mid LAD 95% (hazy-suggestive of a ruptured plaque), EF 40%, distal anterior, apical and distal inferior HK.  PCI: Xience DES to the mid LAD.  Dual antiplatelet therapy recommended for one year.  Since discharge, she has done well. She denies chest pain, dyspnea with exertion. She denies syncope, orthopnea, PND. She denies any bleeding problems. She does note occasional feelings of rapid breathing that typically is resolved by taking Xanax. She also notes flushing after taking her twice daily medications. This is very brief.  Labs (2/14):  K 3.9, creatinine 0.76, LDL 103, Hgb 13.6, TSH 1.681  Wt Readings from Last 3 Encounters:  03/12/12 160 lb 11.5 oz (72.9 kg)  03/12/12 160 lb 11.5 oz (72.9 kg)  07/10/11 149 lb 14.6 oz (68 kg)     Past Medical History  Diagnosis Date  . Hypertension   . Self-catheterizes urinary bladder   . UTI (lower urinary tract infection)   . Macular degeneration   . CAD in native artery     a. NSTEMI 2/14 => LHC 03/08/12: Mid LAD 95% (hazy-suggestive of a ruptured plaque), EF 40%, distal anterior, apical and  distal inferior HK =>  PCI: Xience DES to the mid LAD.  [name] Cardiomyopathy     LVEF 45% on cath/echo 03/2012, suspected to be ischemic vs stress-induced  . Mild mitral regurgitation     a. Echo 03/09/12: Mild focal basal hypertrophy the septum, EF 45-50%, distal anteroseptal, anterolateral, inferolateral, inferoseptal and apical HK, grade 2 diastolic dysfunction, mild MR, PASP 52  . Hyperlipidemia   . Anxiety     Current Outpatient Prescriptions  Medication Sig Dispense Refill  . ALPRAZolam (XANAX) 0.5 MG tablet Take 1 tablet (0.5 mg total) by mouth at bedtime as needed for sleep.  30 tablet  0  . ascorbic acid (VITAMIN C) 1000 MG tablet Take 1,000 mg by mouth every morning.       [name] aspirin 81 MG tablet Take 162 mg by mouth every morning.       [name] atorvastatin (LIPITOR) 80 MG tablet Take 1 tablet (80 mg total) by mouth daily at 6 PM.  30 tablet  3  . cholecalciferol (VITAMIN D) 1000 UNITS tablet Take 2,000 Units by mouth every morning.       [name] CRANBERRY PO Take 1 capsule by mouth every morning.      . fish oil-omega-3 fatty acids 1000 MG capsule Take 2 g by mouth every morning.       [name] lisinopril (PRINIVIL,ZESTRIL) 5 MG tablet Take 5 mg by mouth every morning.       . metoprolol tartrate (LOPRESSOR) 25 MG tablet  Take 1 tablet (25 mg total) by mouth 2 (two) times daily.  60 tablet  3  . Multiple Vitamins-Minerals (ICAPS) CAPS Take 1 capsule by mouth every morning.      . nitroGLYCERIN (NITROSTAT) 0.4 MG SL tablet Place 1 tablet (0.4 mg total) under the tongue every 5 (five) minutes x 3 doses as needed for chest pain.  25 tablet  3  . Ticagrelor (BRILINTA) 90 MG TABS tablet Take 1 tablet (90 mg total) by mouth 2 (two) times daily.  60 tablet  3   No current facility-administered medications for this visit.    Allergies:    Allergies  Allergen Reactions  . Sulfa Antibiotics Rash    Social History:  The patient  reports that she has never smoked. She does not have any smokeless tobacco history  on file. She reports that  drinks alcohol. She reports that she does not use illicit drugs.   ROS:  See HPI. All other systems reviewed and negative.   PHYSICAL EXAM: VS:  BP 106/70  Pulse 72  Ht 5\' 6"  (1.676 m)  Wt 157 lb (71.215 kg)  BMI 25.35 kg/m2 Well nourished, well developed, in no acute distress HEENT: normal Neck: no JVD Vascular: No carotid bruits Cardiac:  normal S1, S2; RRR; 1/6 systolic murmur at the RUSB Lungs:  clear to auscultation bilaterally, no wheezing, rhonchi or rales Abd: soft, nontender, no hepatomegaly Ext: no edema; right groin without hematoma or bruit  Skin: warm and dry Neuro:  CNs 2-12 intact, no focal abnormalities noted  EKG:  NSR, HR 72, normal axis, inferior and anterolateral T wave inversions, no change from prior tracing     ASSESSMENT AND PLAN:  1. Coronary Artery Disease:  No further angina. We discussed the importance of dual antiplatelet therapy. She is interested in cardiac rehabilitation and has already been contacted by the facility. I have recommended she remain out of work for one month post MI. She may then return at half days for one week then full days. I have also given her a note to be excused from her gym membership while completing cardiac rehabilitation.  Check a CBC today. 2. Cardiomyopathy:  Overall, this is likely ischemic. Volume appears stable. Continue beta blocker and ACE inhibitor therapy.  Check a basic metabolic panel today. 3. Hypertension:  Controlled. 4. Hyperlipidemia:  Continue statin. Check lipids and LFTs in 6 weeks. 5. Palpitations: She has episodes of rapid breathing that is brief and relieved by taking anxiolytics. Check labs as noted above. She does not describe a sensation of her heart beat. However, if she continues to have these she will contact us and I will place her on a 48-hour Holter. 6. Disposition:  Follow up with Dr. Antoine Poche in 6 weeks.  Signed, Tereso Newcomer, PA-C  2:05 PM 03/21/2012

## 2012-03-22 ENCOUNTER — Other Ambulatory Visit: Payer: Self-pay | Admitting: Family Medicine

## 2012-03-22 DIAGNOSIS — R1011 Right upper quadrant pain: Secondary | ICD-10-CM

## 2012-03-26 ENCOUNTER — Ambulatory Visit
Admission: RE | Admit: 2012-03-26 | Discharge: 2012-03-26 | Disposition: A | Discharge status: Home / Self Care | Payer: Medicare Other | Source: Ambulatory Visit | Attending: Family Medicine | Admitting: Family Medicine

## 2012-03-26 DIAGNOSIS — R1011 Right upper quadrant pain: Secondary | ICD-10-CM

## 2012-04-04 ENCOUNTER — Telehealth: Payer: Self-pay | Admitting: Cardiology

## 2012-04-04 NOTE — Telephone Encounter (Signed)
Walk In pt form " [facility]" Form Needs Completed pt Will Pick back Up Sent to Pam/Hochrein 04/05/15/KM

## 2012-04-04 NOTE — Telephone Encounter (Signed)
The number to reach pts daughter, [name], is out of service so I called the pt, Erin Roth, and advised her that Dr. Antoine Poche will be in the office tomorrow and that I will forward this note to his nurse, Pam.

## 2012-04-04 NOTE — Telephone Encounter (Signed)
New problem     Paperwork was drop off on yesterday to extended gym membership.  Daughter called back to check on the status

## 2012-04-08 NOTE — Telephone Encounter (Signed)
Paperwork completed and given to MR to scan and call daughter for pick up

## 2012-04-09 ENCOUNTER — Telehealth: Payer: Self-pay | Admitting: Cardiology

## 2012-04-09 NOTE — Telephone Encounter (Signed)
Left Message Romeo Apple Daughter Lamar Benes paper ready For Pick up 04/09/12/KM

## 2012-04-12 ENCOUNTER — Telehealth: Payer: Self-pay | Admitting: Cardiology

## 2012-04-12 NOTE — Telephone Encounter (Signed)
Pt's dtr calling re pt having a funny feeling in her jaw, and pain in her neck after walking, denies any other symptoms, pls advise

## 2012-04-12 NOTE — Telephone Encounter (Addendum)
Called patient's daughter back. 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 Denies any SOB or chest pain. Stopped her walking routine and now without any discomfort. She usually walks 2 times per day for 15 minutes. The daughter feels like her discomfort may have been related to anxiety. She has not used NTG since she was in the hospital. Discussed with Dr.Hochrein and he would like to see her in the office next week. Appointment made for 3/21 at [facility]. Patient's daughter aware of above. She will hold off on her walking routine until she sees Dr.Hochrein for an evaluation. Encouraged her daughter to have the patient use NTG if necessary and /or seek care in the ER if necessary. She verbalized understanding.

## 2012-04-19 ENCOUNTER — Ambulatory Visit (INDEPENDENT_AMBULATORY_CARE_PROVIDER_SITE_OTHER): Payer: Medicare Other | Admitting: Cardiology

## 2012-04-19 ENCOUNTER — Encounter: Payer: Self-pay | Admitting: Cardiology

## 2012-04-19 VITALS — BP 102/80 | HR 79 | Ht 67.0 in | Wt 158.0 lb

## 2012-04-19 DIAGNOSIS — I214 Non-ST elevation (NSTEMI) myocardial infarction: Secondary | ICD-10-CM

## 2012-04-19 DIAGNOSIS — I2510 Atherosclerotic heart disease of native coronary artery without angina pectoris: Secondary | ICD-10-CM

## 2012-04-19 DIAGNOSIS — I059 Rheumatic mitral valve disease, unspecified: Secondary | ICD-10-CM

## 2012-04-19 DIAGNOSIS — I340 Nonrheumatic mitral (valve) insufficiency: Secondary | ICD-10-CM

## 2012-04-19 DIAGNOSIS — I10 Essential (primary) hypertension: Secondary | ICD-10-CM

## 2012-04-19 NOTE — Progress Notes (Signed)
HPI The patient presents for evaluation of chest discomfort. She recently had PCI of an LAD ruptured plaque. At that time she presented with jaw pain. She's now been having some shoulder and arm pain and neck pain that is different from this. However, it concerned her so she moved herself up on my schedule. She has not been exercising or exerting herself. The discomfort comes on at rest. It goes away after several minutes. She doesn't take anything for it.  She does not describe associated symptoms such as nausea vomiting or diaphoresis. She's not had any new palpitations, presyncope or syncope.  Allergies  Allergen Reactions  . Sulfa Antibiotics Rash    Current Outpatient Prescriptions  Medication Sig Dispense Refill  . ALPRAZolam (XANAX) 0.5 MG tablet Take 0.25 mg by mouth at bedtime as needed for sleep.      [name] ascorbic acid (VITAMIN C) 1000 MG tablet Take 1,000 mg by mouth every morning.       [name] aspirin 81 MG tablet Take 81 mg by mouth every morning.       [name] atorvastatin (LIPITOR) 80 MG tablet Take 1 tablet (80 mg total) by mouth daily at 6 PM.  30 tablet  3  . cholecalciferol (VITAMIN D) 1000 UNITS tablet Take 2,000 Units by mouth every morning.       [name] CRANBERRY PO Take 1 capsule by mouth every morning.      . fish oil-omega-3 fatty acids 1000 MG capsule Take 2 g by mouth every morning.       [name] lisinopril (PRINIVIL,ZESTRIL) 5 MG tablet Take 5 mg by mouth every morning.       . metoprolol tartrate (LOPRESSOR) 25 MG tablet Take 1 tablet (25 mg total) by mouth 2 (two) times daily.  60 tablet  3  . Multiple Vitamins-Minerals (ICAPS) CAPS Take 1 capsule by mouth every morning.      . nitrofurantoin (MACRODANTIN) 100 MG capsule Take 100 mg by mouth every other day.       . nitroGLYCERIN (NITROSTAT) 0.4 MG SL tablet Place 1 tablet (0.4 mg total) under the tongue every 5 (five) minutes x 3 doses as needed for chest pain.  25 tablet  3  . Ticagrelor (BRILINTA) 90 MG TABS tablet Take 1 tablet  (90 mg total) by mouth 2 (two) times daily.  60 tablet  3   No current facility-administered medications for this visit.    Past Medical History  Diagnosis Date  . Hypertension   . Self-catheterizes urinary bladder   . UTI (lower urinary tract infection)   . Macular degeneration   . CAD in native artery     a. NSTEMI 2/14 => LHC 03/08/12: Mid LAD 95% (hazy-suggestive of a ruptured plaque), EF 40%, distal anterior, apical and distal inferior HK =>  PCI: Xience DES to the mid LAD.  [name] Cardiomyopathy     LVEF 45% on cath/echo 03/2012, suspected to be ischemic vs stress-induced  . Mild mitral regurgitation     a. Echo 03/09/12: Mild focal basal hypertrophy the septum, EF 45-50%, distal anteroseptal, anterolateral, inferolateral, inferoseptal and apical HK, grade 2 diastolic dysfunction, mild MR, PASP 52  . Hyperlipidemia   . Anxiety     Past Surgical History  Procedure Laterality Date  . Abdominal hysterectomy    . Back surgery    . Coronary angioplasty with stent placement  03/11/12    95% mid LAD stenosis s/p DES, minimal nonobs dz elsewhere; LVEF 40%, moderate hypokinesis  of distal anterior, apical and distal inferior walls, mildly elevated filling pressures, mild pulmonary HTN    ROS:  As stated in the HPI and negative for all other systems.  PHYSICAL EXAM BP 102/80  Pulse 79  Ht 5\' 7"  (1.702 m)  Wt 158 lb (71.668 kg)  BMI 24.74 kg/m2  SpO2 95% GENERAL:  Well appearing HEENT:  Pupils equal round and reactive, fundi not visualized, oral mucosa unremarkable NECK:  No jugular venous distention, waveform within normal limits, carotid upstroke brisk and symmetric, no bruits, no thyromegaly LYMPHATICS:  No cervical, inguinal adenopathy LUNGS:  Clear to auscultation bilaterally BACK:  No CVA tenderness CHEST:  Unremarkable HEART:  PMI not displaced or sustained,S1 and S2 within normal limits, no S3, no S4, no clicks, no rubs, no murmurs ABD:  Flat, positive bowel sounds normal in  frequency in pitch, no bruits, no rebound, no guarding, no midline pulsatile mass, no hepatomegaly, no splenomegaly EXT:  2 plus pulses throughout, no edema, no cyanosis no clubbing SKIN:  No rashes no nodules NEURO:  Cranial nerves II through XII grossly intact, motor grossly intact throughout PSYCH:  Cognitively intact, oriented to person place and time   ASSESSMENT AND PLAN  CAD:  Her symptoms are somewhat atypical in different than previous. I'm not suspicious that she has thrombosed her stent. However, I would like to walk her on a treadmill. This is particularly important as she does not want to do cardiac rehabilitation but would like to start to exercise. I will bring her back to do this.  CARDIOMYOPATHY:  I will follow up with repeat echocardiogram in the months to come.  HTN:  The blood pressure is at target. No change in medications is indicated. We will continue with therapeutic lifestyle changes (TLC).

## 2012-04-19 NOTE — Patient Instructions (Addendum)
The current medical regimen is effective;  continue present plan and medications.  Your physician has requested that you have an exercise tolerance test. For further information please visit [email or web address]. Please also follow instruction sheet, as given.   

## 2012-05-02 ENCOUNTER — Other Ambulatory Visit: Payer: Medicare Other

## 2012-05-02 ENCOUNTER — Ambulatory Visit (INDEPENDENT_AMBULATORY_CARE_PROVIDER_SITE_OTHER): Payer: Medicare Other | Admitting: Cardiology

## 2012-05-02 ENCOUNTER — Encounter: Payer: Self-pay | Admitting: *Deleted

## 2012-05-02 ENCOUNTER — Ambulatory Visit: Payer: Medicare Other | Admitting: Cardiology

## 2012-05-02 DIAGNOSIS — I2510 Atherosclerotic heart disease of native coronary artery without angina pectoris: Secondary | ICD-10-CM

## 2012-05-02 DIAGNOSIS — I214 Non-ST elevation (NSTEMI) myocardial infarction: Secondary | ICD-10-CM

## 2012-05-02 NOTE — Procedures (Signed)
Exercise Treadmill Test  Pre-Exercise Testing Evaluation Rhythm: normal sinus  Rate: 85     Test  Exercise Tolerance Test Ordering MD: Angelina Sheriff, MD  Interpreting MD: Angelina Sheriff, MD  Unique Test No: 1  Treadmill:  1  Indication for ETT: chest pain - rule out ischemia  Contraindication to ETT: No   Stress Modality: exercise - treadmill  Cardiac Imaging Performed: non   Protocol: standard Bruce - maximal  Max BP:  165/103  Max MPHR (bpm):  143 85% MPR (bpm):  122  MPHR obtained (bpm):  141 % MPHR obtained:  99  Reached 85% MPHR (min:sec):  2:35 Total Exercise Time (min-sec):  4:00  Workload in METS:  5.2 Borg Scale: 15  Reason ETT Terminated:  desired heart rate attained    ST Segment Analysis At Rest: normal ST segments - no evidence of significant ST depression With Exercise: no evidence of significant ST depression  Other Information Arrhythmia:  No Angina during ETT:  absent (0) Quality of ETT:  diagnostic  ETT Interpretation:  normal - no evidence of ischemia by ST analysis  Comments: The patient had a good exercise tolerance.  There was no chest pain.  There was an appropriate level of dyspnea.  There were no arrhythmias, a normal heart rate response and normal BP response.  There were no ischemic ST T wave changes and a normal heart rate recovery.   Recommendations: Negative adequate ETT.  No further testing is indicated.  Based on the above I gave the patient a prescription for exercise.

## 2012-05-02 NOTE — Patient Instructions (Addendum)
The current medical regimen is effective;  continue present plan and medications.  Follow up in 6 months with Dr Hochrein.  You will receive a letter in the mail 2 months before you are due.  Please call us when you receive this letter to schedule your follow up appointment.  

## 2012-06-17 ENCOUNTER — Other Ambulatory Visit: Payer: Self-pay | Admitting: Family Medicine

## 2012-07-02 ENCOUNTER — Other Ambulatory Visit: Payer: Self-pay | Admitting: Physician Assistant

## 2012-07-16 ENCOUNTER — Ambulatory Visit (HOSPITAL_COMMUNITY)
Admission: RE | Admit: 2012-07-16 | Discharge: 2012-07-16 | Disposition: A | Discharge status: Home / Self Care | Payer: Medicare Other | Source: Ambulatory Visit | Attending: Family Medicine | Admitting: Family Medicine

## 2012-07-16 ENCOUNTER — Other Ambulatory Visit (HOSPITAL_COMMUNITY): Payer: Self-pay | Admitting: Family Medicine

## 2012-07-16 ENCOUNTER — Encounter (HOSPITAL_COMMUNITY): Payer: Self-pay

## 2012-07-16 DIAGNOSIS — M810 Age-related osteoporosis without current pathological fracture: Secondary | ICD-10-CM | POA: Insufficient documentation

## 2012-07-16 MED ORDER — SODIUM CHLORIDE 0.9 % IV SOLN
Freq: Once | INTRAVENOUS | Status: AC
  Administered 2012-07-16: 12:00:00 via INTRAVENOUS

## 2012-07-16 MED ORDER — ZOLEDRONIC ACID 5 MG/100ML IV SOLN
5.0000 mg | Freq: Once | INTRAVENOUS | Status: AC
  Administered 2012-07-16: 5 mg via INTRAVENOUS
  Filled 2012-07-16: qty 100

## 2012-07-16 NOTE — Progress Notes (Signed)
Patient complained of diarrhea for 2 days. Spoke with Dr. Cliffton Asters to verify proceeding with Reclast infusion. OK to proceed per Dr. Cliffton Asters.

## 2012-08-03 ENCOUNTER — Other Ambulatory Visit: Payer: Self-pay | Admitting: Physician Assistant

## 2012-08-29 ENCOUNTER — Telehealth: Payer: Self-pay | Admitting: Cardiology

## 2012-08-29 NOTE — Telephone Encounter (Signed)
noted 

## 2012-08-29 NOTE — Telephone Encounter (Signed)
New Prob     Pt states she has had some dizziness and anxiety. Would like to speak to nurse regarding this.

## 2012-08-29 NOTE — Telephone Encounter (Signed)
Spoke with pt who reports she did her normal routine of going to the pool for an exercise class and then into hot tub. Upon standing up and getting out of hot tub she had episode of dizziness. Lasted less than a minute and went away after drinking water. She took shower and felt better. She reports she is a nervous person and later upon thinking of episode of dizziness she developed some shortness of breath. No chest pain. She thinks shortness of breath is related to her anxiety.  I have asked her to avoid hot tubs for next week or so and to stay hydrated.  I also told her if shortness of breath continues or worsens to let us know or go to ED to be evaluated.

## 2012-10-24 ENCOUNTER — Telehealth: Payer: Self-pay | Admitting: Nurse Practitioner

## 2012-10-24 NOTE — Telephone Encounter (Signed)
Pt called this evening stating that over the past few nights, she has awakened with bilat arm pain and numbness that relieves with repositioning.  She has not had any chest or jaw pain.(prior anginal equivalent) and there are no associated Ss.  I advised that it's not possible to be sure over the phone that her Ss are not coming from her heart but based on hx alone, they do not sound to be.  I advised that if Ss recur, persist, or become associated with dyspnea, n, v, dizziness, syncope, that she call 911.  She verbalized understanding and feels somewhat reassured.  She has a f/u with Dr. Antoine Poche next week.

## 2012-10-29 ENCOUNTER — Other Ambulatory Visit: Payer: Self-pay | Admitting: Cardiology

## 2012-10-31 ENCOUNTER — Encounter: Payer: Self-pay | Admitting: Cardiology

## 2012-10-31 ENCOUNTER — Ambulatory Visit (INDEPENDENT_AMBULATORY_CARE_PROVIDER_SITE_OTHER): Payer: Medicare Other | Admitting: Cardiology

## 2012-10-31 VITALS — BP 136/80 | HR 65 | Ht 66.0 in | Wt 138.0 lb

## 2012-10-31 DIAGNOSIS — E7800 Pure hypercholesterolemia, unspecified: Secondary | ICD-10-CM

## 2012-10-31 DIAGNOSIS — I2510 Atherosclerotic heart disease of native coronary artery without angina pectoris: Secondary | ICD-10-CM

## 2012-10-31 DIAGNOSIS — I059 Rheumatic mitral valve disease, unspecified: Secondary | ICD-10-CM

## 2012-10-31 DIAGNOSIS — I429 Cardiomyopathy, unspecified: Secondary | ICD-10-CM

## 2012-10-31 DIAGNOSIS — I428 Other cardiomyopathies: Secondary | ICD-10-CM

## 2012-10-31 DIAGNOSIS — Z79899 Other long term (current) drug therapy: Secondary | ICD-10-CM

## 2012-10-31 DIAGNOSIS — I340 Nonrheumatic mitral (valve) insufficiency: Secondary | ICD-10-CM

## 2012-10-31 LAB — HEPATIC FUNCTION PANEL
ALT: 38 U/L — ABNORMAL HIGH (ref 0–35)
Albumin: 4 g/dL (ref 3.5–5.2)
Alkaline Phosphatase: 44 U/L (ref 39–117)
Total Protein: 7.3 g/dL (ref 6.0–8.3)

## 2012-10-31 NOTE — Progress Notes (Signed)
HPI The patient presents for evaluation of chest discomfort. She recently had PCI of an LAD ruptured plaque. At that time she presented with jaw pain.  At the last visit she had some atypical pain. However, I will exercise treadmill testing was fine. She's been having some arm numbness on her right arm. She thought this was related to the way she was sleeping as it only happened in at night. It is not like her previous angina. She's able to do water aerobics without any chest pressure, neck or arm discomfort. She's not describing shortness of breath, PND or orthopnea. She has had no palpitations, presyncope or syncope.  Allergies  Allergen Reactions  . Sulfa Antibiotics Rash    Current Outpatient Prescriptions  Medication Sig Dispense Refill  . ascorbic acid (VITAMIN C) 1000 MG tablet Take 1,000 mg by mouth every morning.       [name] aspirin 81 MG tablet Take 81 mg by mouth every morning.       [name] atorvastatin (LIPITOR) 80 MG tablet TAKE 1 TABLET BY MOUTH DAILY AT 6:00PM  30 tablet  3  . BRILINTA 90 MG TABS tablet TAKE 1 TABLET BY MOUTH TWICE A DAY  60 tablet  3  . cholecalciferol (VITAMIN D) 1000 UNITS tablet Take 1,000 Units by mouth daily.       . fish oil-omega-3 fatty acids 1000 MG capsule Take 2 g by mouth every morning.       [name] lisinopril (PRINIVIL,ZESTRIL) 5 MG tablet Take 5 mg by mouth every morning.       . metoprolol tartrate (LOPRESSOR) 25 MG tablet TAKE 1 TABLET BY MOUTH TWICE A DAY  60 tablet  3  . Multiple Vitamins-Minerals (ICAPS) CAPS Take 1 capsule by mouth every morning.      . nitrofurantoin (MACRODANTIN) 100 MG capsule Take 100 mg by mouth every other day.       . nitroGLYCERIN (NITROSTAT) 0.4 MG SL tablet Place 1 tablet (0.4 mg total) under the tongue every 5 (five) minutes x 3 doses as needed for chest pain.  25 tablet  3  . ranitidine (ZANTAC) 300 MG tablet Take 300 mg by mouth at bedtime.       No current facility-administered medications for this visit.    Past  Medical History  Diagnosis Date  . Hypertension   . Self-catheterizes urinary bladder   . UTI (lower urinary tract infection)   . Macular degeneration   . CAD in native artery     a. NSTEMI 2/14 => LHC 03/08/12: Mid LAD 95% (hazy-suggestive of a ruptured plaque), EF 40%, distal anterior, apical and distal inferior HK =>  PCI: Xience DES to the mid LAD.  [name] Cardiomyopathy     LVEF 45% on cath/echo 03/2012, suspected to be ischemic vs stress-induced  . Mild mitral regurgitation     a. Echo 03/09/12: Mild focal basal hypertrophy the septum, EF 45-50%, distal anteroseptal, anterolateral, inferolateral, inferoseptal and apical HK, grade 2 diastolic dysfunction, mild MR, PASP 52  . Hyperlipidemia   . Anxiety     Past Surgical History  Procedure Laterality Date  . Abdominal hysterectomy    . Back surgery    . Coronary angioplasty with stent placement  03/11/12    95% mid LAD stenosis s/p DES, minimal nonobs dz elsewhere; LVEF 40%, moderate hypokinesis of distal anterior, apical and distal inferior walls, mildly elevated filling pressures, mild pulmonary HTN    ROS:  As stated in the HPI and  negative for all other systems.  PHYSICAL EXAM BP 136/80  Pulse 65  Ht 5\' 6"  (1.676 m)  Wt 138 lb (62.596 kg)  BMI 22.28 kg/m2 GENERAL:  Well appearing NECK:  No jugular venous distention, waveform within normal limits, carotid upstroke brisk and symmetric, no bruits, no thyromegaly LYMPHATICS:  No cervical, inguinal adenopathy LUNGS:  Clear to auscultation bilaterally BACK:  No CVA tenderness CHEST:  Unremarkable HEART:  PMI not displaced or sustained,S1 and S2 within normal limits, no S3, no S4, no clicks, no rubs, no murmurs ABD:  Flat, positive bowel sounds normal in frequency in pitch, no bruits, no rebound, no guarding, no midline pulsatile mass, no hepatomegaly, no splenomegaly EXT:  2 plus pulses throughout, no edema, no cyanosis no clubbing SKIN:  No rashes no nodules , mild bruising  EKG:   Sinus rhythm, rate 65, axis within normal limits, intervals within normal limits, no acute ST-T wave changes.  10/31/2012   ASSESSMENT AND PLAN  CAD:  She's not having any of her previous jaw pain which was her angina. Her arm pain maybe some kind of cervical neck problem and she might see a neurosurgeon. She will remain on her Brilinta until February.  CARDIOMYOPATHY:  I will follow up with repeat echocardiogram in Feb when she comes back for her appointment.  HTN:  The blood pressure is at target. No change in medications is indicated. We will continue with therapeutic lifestyle changes (TLC).  HYPERLIPIDEMIA: I will check a lipid profile today.

## 2012-10-31 NOTE — Patient Instructions (Addendum)
The current medical regimen is effective;  continue present plan and medications.  Please have blood work today (lipid and liver)  Your physician has requested that you have an echocardiogram in 03/2013. Echocardiography is a painless test that uses sound waves to create images of your heart. It provides your doctor with information about the size and shape of your heart and how well your heart's chambers and valves are working. This procedure takes approximately one hour. There are no restrictions for this procedure.  Follow up in 03/2013 with Dr Antoine Poche.  You will receive a letter in the mail 2 months before you are due.  Please call us when you receive this letter to schedule your follow up appointment.

## 2012-11-04 ENCOUNTER — Telehealth: Payer: Self-pay | Admitting: Cardiology

## 2012-11-04 ENCOUNTER — Ambulatory Visit: Payer: Medicare Other

## 2012-11-04 DIAGNOSIS — E7800 Pure hypercholesterolemia, unspecified: Secondary | ICD-10-CM

## 2012-11-04 NOTE — Telephone Encounter (Signed)
New message  Had lab on Thursday need results.  Also, pt want a copy of labs mailed to her.

## 2012-11-04 NOTE — Telephone Encounter (Signed)
Line busy - lipid profile was not ran.  Erin Roth to do an add-on slip.  Will continue to attempt to contact pt.

## 2012-11-05 LAB — LIPID PANEL
Cholesterol: 131 mg/dL (ref 0–200)
HDL: 62.7 mg/dL (ref >39.00)
Total CHOL/HDL Ratio: 2

## 2012-11-11 NOTE — Telephone Encounter (Signed)
Lab results completed and reviewed by Dr Domingo Dimes continue to attempt to reach pt by phone and will mail results as requested.

## 2012-11-25 ENCOUNTER — Other Ambulatory Visit: Payer: Self-pay | Admitting: Cardiology

## 2012-12-09 ENCOUNTER — Telehealth: Payer: Self-pay | Admitting: Cardiology

## 2012-12-25 ENCOUNTER — Other Ambulatory Visit: Payer: Self-pay | Admitting: Neurosurgery

## 2012-12-25 DIAGNOSIS — M79609 Pain in unspecified limb: Secondary | ICD-10-CM

## 2012-12-30 ENCOUNTER — Other Ambulatory Visit: Payer: Self-pay | Admitting: Cardiology

## 2013-01-03 ENCOUNTER — Ambulatory Visit
Admission: RE | Admit: 2013-01-03 | Discharge: 2013-01-03 | Disposition: A | Discharge status: Home / Self Care | Payer: Medicare Other | Source: Ambulatory Visit | Attending: Neurosurgery | Admitting: Neurosurgery

## 2013-01-03 DIAGNOSIS — M79609 Pain in unspecified limb: Secondary | ICD-10-CM

## 2013-02-18 ENCOUNTER — Encounter: Payer: Self-pay | Admitting: Neurology

## 2013-02-19 ENCOUNTER — Encounter: Payer: Self-pay | Admitting: Neurology

## 2013-02-19 ENCOUNTER — Encounter (INDEPENDENT_AMBULATORY_CARE_PROVIDER_SITE_OTHER): Payer: Self-pay

## 2013-02-19 ENCOUNTER — Ambulatory Visit (INDEPENDENT_AMBULATORY_CARE_PROVIDER_SITE_OTHER): Payer: Medicare HMO | Admitting: Neurology

## 2013-02-19 VITALS — BP 162/88 | HR 64 | Ht 64.0 in | Wt 151.0 lb

## 2013-02-19 DIAGNOSIS — G5600 Carpal tunnel syndrome, unspecified upper limb: Secondary | ICD-10-CM

## 2013-02-19 DIAGNOSIS — Z0289 Encounter for other administrative examinations: Secondary | ICD-10-CM

## 2013-02-19 DIAGNOSIS — R209 Unspecified disturbances of skin sensation: Secondary | ICD-10-CM

## 2013-02-19 NOTE — Progress Notes (Signed)
Reason for visit: Numbness  Erin Roth is a 80 y.o. female  History of present illness:  Erin Roth is a 80 year old right-handed white female with a history of onset of bilateral, right greater than left hand numbness and discomfort that began in mid October 2014. The patient has had gradual spread of the numbness of the arms to the shoulder level associated with discomfort. The discomfort is worse in the evening hours, and it may prevent her from sleeping well. The patient reports no weakness of the arms, and she is not dropping things. The patient denies any numbness, weakness, or pain in the lower extremities. The patient denies neck pain or neck stiffness. The patient denies any problems with balance or problems controlling the bowels or the bladder. The patient has undergone MRI evaluation of the brain and cervical spine. MRI of the brain showed minimal white matter changes, and MRI of the cervical spine showed some mild multilevel spondylosis without definite spinal cord or nerve root impingement. The patient is sent to this office for further evaluation of the symptoms. The patient indicates that her daughter gave her wrist splints to wear, and this has been helpful for the discomfort.  Past Medical History  Diagnosis Date  . Hypertension   . Self-catheterizes urinary bladder   . UTI (lower urinary tract infection)   . Macular degeneration   . CAD in native artery     a. NSTEMI 2/14 => LHC 03/08/12: Mid LAD 95% (hazy-suggestive of a ruptured plaque), EF 40%, distal anterior, apical and distal inferior HK =>  PCI: Xience DES to the mid LAD.  [name] Cardiomyopathy     LVEF 45% on cath/echo 03/2012, suspected to be ischemic vs stress-induced  . Mild mitral regurgitation     a. Echo 03/09/12: Mild focal basal hypertrophy the septum, EF 45-50%, distal anteroseptal, anterolateral, inferolateral, inferoseptal and apical HK, grade 2 diastolic dysfunction, mild MR, PASP 52  . Hyperlipidemia   .  Anxiety     Past Surgical History  Procedure Laterality Date  . Abdominal hysterectomy  1980  . Back surgery  1986  . Coronary angioplasty with stent placement  03/11/12    95% mid LAD stenosis s/p DES, minimal nonobs dz elsewhere; LVEF 40%, moderate hypokinesis of distal anterior, apical and distal inferior walls, mildly elevated filling pressures, mild pulmonary HTN  . Metatarsal osteotomy  2012    5th foot  . Reclast  2013    Family History  Problem Relation Age of Onset  . Colon cancer Father   . Hypertension Mother   . CAD Mother   . CAD Brother 53    CABG  . Cancer - Lung Brother   . Cancer - Lung Sister     Social history:  reports that she has never smoked. She has never used smokeless tobacco. She reports that she drinks alcohol. She reports that she does not use illicit drugs.  Medications:  Current Outpatient Prescriptions on File Prior to Visit  Medication Sig Dispense Refill  . ALPRAZolam (XANAX) 0.25 MG tablet Take 0.25 mg by mouth at bedtime as needed for anxiety.      [name] ascorbic acid (VITAMIN C) 1000 MG tablet Take 1,000 mg by mouth every morning.       [name] atorvastatin (LIPITOR) 80 MG tablet TAKE 1 TABLET BY MOUTH DAILY AT 6:00PM  30 tablet  3  . BRILINTA 90 MG TABS tablet TAKE 1 TABLET BY MOUTH TWICE A DAY  60 tablet  1  . cholecalciferol (VITAMIN D) 1000 UNITS tablet Take 1,000 Units by mouth daily.       . fish oil-omega-3 fatty acids 1000 MG capsule Take 2 g by mouth every morning.       [name] lisinopril (PRINIVIL,ZESTRIL) 5 MG tablet Take 5 mg by mouth every morning.       . metoprolol tartrate (LOPRESSOR) 25 MG tablet TAKE 1 TABLET BY MOUTH TWICE A DAY  60 tablet  3  . Multiple Vitamins-Minerals (ICAPS) CAPS Take 1 capsule by mouth every morning.      . nitrofurantoin (MACRODANTIN) 100 MG capsule Take 100 mg by mouth every other day.       . nitroGLYCERIN (NITROSTAT) 0.4 MG SL tablet Place 1 tablet (0.4 mg total) under the tongue every 5 (five) minutes x 3  doses as needed for chest pain.  25 tablet  3   No current facility-administered medications on file prior to visit.      Allergies  Allergen Reactions  . Sulfa Antibiotics Rash    ROS:  Out of a complete 14 system review of symptoms, the patient complains only of the following symptoms, and all other reviewed systems are negative.  Numbness  Blood pressure 162/88, pulse 64, height 5\' 4"  (1.626 m), weight 151 lb (68.493 kg).  Physical Exam  General: The patient is alert and cooperative at the time of the examination.  Eyes: Pupils are equal, round, and reactive to light. Discs are flat bilaterally.  Neck: The neck is supple, no carotid bruits are noted.  Respiratory: The respiratory examination is clear.  Cardiovascular: The cardiovascular examination reveals a regular rate and rhythm, a grade III/VI systolic ejection murmur at the aortic area is noted.  Skin: Extremities are without significant edema.  Neurologic Exam  Mental status: The patient is alert and oriented x 3 at the time of the examination. The patient has apparent normal recent and remote memory, with an apparently normal attention span and concentration ability.  Cranial nerves: Facial symmetry is present. There is good sensation of the face to pinprick and soft touch bilaterally. The strength of the facial muscles and the muscles to head turning and shoulder shrug are normal bilaterally. Speech is well enunciated, no aphasia or dysarthria is noted. Extraocular movements are full. Visual fields are full. The tongue is midline, and the patient has symmetric elevation of the soft palate. No obvious hearing deficits are noted.  Motor: The motor testing reveals 5 over 5 strength of all 4 extremities. The patient has good strength of the APB muscles on both sides. Good symmetric motor tone is noted throughout.  Sensory: Sensory testing is intact to pinprick, soft touch, vibration sensation, and position sense on all  4 extremities. No evidence of extinction is noted.  Coordination: Cerebellar testing reveals good finger-nose-finger and heel-to-shin bilaterally. Tinel sign on the wrists were negative bilaterally.  Gait and station: Gait is normal. Tandem gait is normal. Romberg is negative. No drift is seen.  Reflexes: Deep tendon reflexes are symmetric and normal bilaterally. Toes are downgoing bilaterally.   MRI brain 12/25/2012:  MRI HEAD:  No acute infarct.  Scattered punctate and patchy white matter type changes most  consistent with result of small vessel disease.    Assessment/Plan:  1. Bilateral arm and hand numbness  The clinical examination objectively is normal. The patient will be evaluated for possible carpal tunnel syndrome, and nerve conduction studies will be set up on both arms. If this is unremarkable,  we will pursue blood work and possibly an EMG of the right arm. The patient will followup if needed. The patient seems to be gaining benefit with the wrist splints, and she will continue to wear this. MRI evaluation of the cervical spine did not show a clear etiology of her arm symptoms.  Addendum: The NCV study done today shows bilateral carpal tunnel syndrome, of moderate severity on the right, mild on the left. The patient will be given a prescription for wrist splints, and she is to call our office if she has not improved within the next 6 to 8 weeks.  Jill Alexanders MD 02/19/2013 8:17 PM  [facility] [address] [place], [place] [address]  Phone [phone] Fax [phone]

## 2013-02-19 NOTE — Procedures (Signed)
     HISTORY:  Erin Roth is a 80 year old patient with a several month history of numbness in both hands. The patient has some discomfort in the hands and upper arms to the shoulders. The patient is being evaluated for this issue.  NERVE CONDUCTION STUDIES:  Nerve conduction studies were performed on both upper extremities. The distal motor latencies for the median nerves were prolonged bilaterally, with a low motor and on the right, normal on the left. The sensory latencies for the median nerves were prolonged bilaterally. The distal motor latencies and motor amplitudes for the ulnar nerves were normal bilaterally, with normal sensory latencies bilaterally. The F wave latencies and nerve conduction velocities for the median and ulnar nerves were normal bilaterally.  EMG STUDIES:   EMG evaluation was not performed.  IMPRESSION:  Nerve conduction studies revealed evidence of bilateral carpal tunnel syndrome of moderate severity on the right, mild severity on the left. No other significant abnormalities were seen.  Jill Alexanders MD 02/19/2013 6:41 PM  [facility] [address] [place],  [address]  Phone [phone] Fax [phone]

## 2013-02-26 ENCOUNTER — Other Ambulatory Visit: Payer: Self-pay | Admitting: *Deleted

## 2013-02-26 MED ORDER — METOPROLOL TARTRATE 25 MG PO TABS: ORAL_TABLET | ORAL | Status: DC

## 2013-02-26 MED ORDER — ATORVASTATIN CALCIUM 80 MG PO TABS: 80.0000 mg | ORAL_TABLET | Freq: Every day | ORAL | Status: DC

## 2013-02-26 MED ORDER — TICAGRELOR 90 MG PO TABS: ORAL_TABLET | ORAL | Status: DC

## 2013-03-11 ENCOUNTER — Ambulatory Visit: Payer: Medicare Other | Admitting: Cardiology

## 2013-03-19 ENCOUNTER — Telehealth: Payer: Self-pay | Admitting: Neurology

## 2013-03-20 NOTE — Telephone Encounter (Signed)
Spoke with patient and informed that order was being mailed today, she verbalized understanding

## 2013-03-21 ENCOUNTER — Ambulatory Visit (INDEPENDENT_AMBULATORY_CARE_PROVIDER_SITE_OTHER): Payer: Medicare HMO | Admitting: Cardiology

## 2013-03-21 ENCOUNTER — Encounter: Payer: Self-pay | Admitting: Cardiology

## 2013-03-21 VITALS — BP 134/84 | HR 67 | Ht 64.0 in | Wt 149.8 lb

## 2013-03-21 DIAGNOSIS — I340 Nonrheumatic mitral (valve) insufficiency: Secondary | ICD-10-CM

## 2013-03-21 DIAGNOSIS — I429 Cardiomyopathy, unspecified: Secondary | ICD-10-CM

## 2013-03-21 DIAGNOSIS — I059 Rheumatic mitral valve disease, unspecified: Secondary | ICD-10-CM

## 2013-03-21 DIAGNOSIS — I428 Other cardiomyopathies: Secondary | ICD-10-CM

## 2013-03-21 DIAGNOSIS — I2510 Atherosclerotic heart disease of native coronary artery without angina pectoris: Secondary | ICD-10-CM

## 2013-03-21 DIAGNOSIS — I2789 Other specified pulmonary heart diseases: Secondary | ICD-10-CM

## 2013-03-21 DIAGNOSIS — I272 Pulmonary hypertension, unspecified: Secondary | ICD-10-CM

## 2013-03-21 MED ORDER — METOPROLOL TARTRATE 25 MG PO TABS: ORAL_TABLET | ORAL | Status: DC

## 2013-03-21 MED ORDER — TICAGRELOR 90 MG PO TABS: ORAL_TABLET | ORAL | Status: DC

## 2013-03-21 MED ORDER — ATORVASTATIN CALCIUM 80 MG PO TABS: 80.0000 mg | ORAL_TABLET | Freq: Every day | ORAL | Status: DC

## 2013-03-21 MED ORDER — LISINOPRIL 5 MG PO TABS: 5.0000 mg | ORAL_TABLET | Freq: Every morning | ORAL | Status: DC

## 2013-03-21 MED ORDER — CLOPIDOGREL BISULFATE 75 MG PO TABS
75.0000 mg | ORAL_TABLET | Freq: Every day | ORAL | Status: DC
Start: 2013-03-21 — End: 2013-04-19

## 2013-03-21 NOTE — Patient Instructions (Signed)
Please stop Brilinta After your surgery restart on Plavix. Continue all other medications as listed.  Your physician has requested that you have an echocardiogram. Echocardiography is a painless test that uses sound waves to create images of your heart. It provides your doctor with information about the size and shape of your heart and how well your heart's chambers and valves are working. This procedure takes approximately one hour. There are no restrictions for this procedure.  Follow up in 6 month with Dr Percival Spanish.  You will receive a letter in the mail 2 months before you are due.  Please call us when you receive this letter to schedule your follow up appointment.

## 2013-03-21 NOTE — Progress Notes (Signed)
HPI The patient presents for evaluation of chest discomfort. She recently had PCI of an LAD ruptured plaque. At that time she presented with jaw pain.  At a follow up visit she had some atypical pain. However, exercise treadmill testing was fine. She's been having some arm numbness on her right arm and has been diagnosed with carpal tunnel syndrome.  She is in need of bladder surgery. She's not been getting any symptoms consistent with her previous angina. She's able to do activities without any chest pressure, neck or arm discomfort. However, she is not exercising as much as I would like. She's not describing shortness of breath, PND or orthopnea. She has had no palpitations, presyncope or syncope.  Allergies  Allergen Reactions  . Sulfa Antibiotics Rash    Current Outpatient Prescriptions  Medication Sig Dispense Refill  . ALPRAZolam (XANAX) 0.25 MG tablet Take 0.25 mg by mouth at bedtime as needed for anxiety.      [name] atorvastatin (LIPITOR) 80 MG tablet Take 1 tablet (80 mg total) by mouth daily.  30 tablet  0  . cholecalciferol (VITAMIN D) 1000 UNITS tablet Take 1,000 Units by mouth daily.       . fish oil-omega-3 fatty acids 1000 MG capsule Take 2 g by mouth every morning.       [name] lisinopril (PRINIVIL,ZESTRIL) 5 MG tablet Take 5 mg by mouth every morning.       . metoprolol tartrate (LOPRESSOR) 25 MG tablet TAKE 1 TABLET BY MOUTH TWICE A DAY  60 tablet  0  . Multiple Vitamins-Minerals (ICAPS) CAPS Take 1 capsule by mouth every morning.      . nitrofurantoin (MACRODANTIN) 100 MG capsule Take 100 mg by mouth every other day.       . nitroGLYCERIN (NITROSTAT) 0.4 MG SL tablet Place 1 tablet (0.4 mg total) under the tongue every 5 (five) minutes x 3 doses as needed for chest pain.  25 tablet  3  . Ticagrelor (BRILINTA) 90 MG TABS tablet TAKE 1 TABLET BY MOUTH TWICE A DAY  60 tablet  0   No current facility-administered medications for this visit.    Past Medical History  Diagnosis Date   . Hypertension   . Self-catheterizes urinary bladder   . UTI (lower urinary tract infection)   . Macular degeneration   . CAD in native artery     a. NSTEMI 2/14 => LHC 03/08/12: Mid LAD 95% (hazy-suggestive of a ruptured plaque), EF 40%, distal anterior, apical and distal inferior HK =>  PCI: Xience DES to the mid LAD.  [name] Cardiomyopathy     LVEF 45% on cath/echo 03/2012, suspected to be ischemic vs stress-induced  . Mild mitral regurgitation     a. Echo 03/09/12: Mild focal basal hypertrophy the septum, EF 45-50%, distal anteroseptal, anterolateral, inferolateral, inferoseptal and apical HK, grade 2 diastolic dysfunction, mild MR, PASP 52  . Hyperlipidemia   . Anxiety     Past Surgical History  Procedure Laterality Date  . Abdominal hysterectomy  1980  . Back surgery  1986  . Coronary angioplasty with stent placement  03/11/12    95% mid LAD stenosis s/p DES, minimal nonobs dz elsewhere; LVEF 40%, moderate hypokinesis of distal anterior, apical and distal inferior walls, mildly elevated filling pressures, mild pulmonary HTN  . Metatarsal osteotomy  2012    5th foot  . Reclast  2013    ROS:  As stated in the HPI and negative for all other systems.  PHYSICAL EXAM BP 134/84  Pulse 67  Ht 5\' 4"  (1.626 m)  Wt 149 lb 12.8 oz (67.949 kg)  BMI 25.70 kg/m2 GENERAL:  Well appearing NECK:  No jugular venous distention, waveform within normal limits, carotid upstroke brisk and symmetric, no bruits, no thyromegaly LYMPHATICS:  No cervical, inguinal adenopathy LUNGS:  Clear to auscultation bilaterally CHEST:  Unremarkable HEART:  PMI not displaced or sustained,S1 and S2 within normal limits, no S3, no S4, no clicks, no rubs, no murmurs ABD:  Flat, positive bowel sounds normal in frequency in pitch, no bruits, no rebound, no guarding, no midline pulsatile mass, no hepatomegaly, no splenomegaly EXT:  2 plus pulses throughout, no edema, no cyanosis no clubbing  EKG:  Sinus rhythm, rate 67, axis  within normal limits, intervals within normal limits, no acute ST-T wave changes.  03/21/2013   ASSESSMENT AND PLAN  CAD:  She's had no new symptoms. She will continue his secondary risk reduction.  I will be switching her to Plavix. She can hold this until after upcoming bladder surgery.  CARDIOMYOPATHY:  I will follow up with repeat echocardiogram.  Further med titration will be based on this.  HTN:  The blood pressure is at target. No change in medications is indicated. We will continue with therapeutic lifestyle changes (TLC).  HYPERLIPIDEMIA: Her lipid profile in October was excellent. She will continue the meds as listed.  PREOP:  Based on ACC/AHA guidelines, the patient would be at acceptable risk for the planned procedure without further cardiovascular testing.

## 2013-03-24 ENCOUNTER — Ambulatory Visit (HOSPITAL_COMMUNITY): Payer: Medicare HMO | Attending: Cardiology | Admitting: Radiology

## 2013-03-24 ENCOUNTER — Encounter: Payer: Self-pay | Admitting: Cardiology

## 2013-03-24 DIAGNOSIS — I272 Pulmonary hypertension, unspecified: Secondary | ICD-10-CM

## 2013-03-24 DIAGNOSIS — I2789 Other specified pulmonary heart diseases: Secondary | ICD-10-CM | POA: Insufficient documentation

## 2013-03-24 DIAGNOSIS — R0789 Other chest pain: Secondary | ICD-10-CM

## 2013-03-24 DIAGNOSIS — I252 Old myocardial infarction: Secondary | ICD-10-CM | POA: Insufficient documentation

## 2013-03-24 DIAGNOSIS — R079 Chest pain, unspecified: Secondary | ICD-10-CM | POA: Insufficient documentation

## 2013-03-24 DIAGNOSIS — I428 Other cardiomyopathies: Secondary | ICD-10-CM

## 2013-03-24 DIAGNOSIS — I340 Nonrheumatic mitral (valve) insufficiency: Secondary | ICD-10-CM

## 2013-03-24 DIAGNOSIS — I429 Cardiomyopathy, unspecified: Secondary | ICD-10-CM

## 2013-03-24 DIAGNOSIS — I059 Rheumatic mitral valve disease, unspecified: Secondary | ICD-10-CM | POA: Insufficient documentation

## 2013-03-24 DIAGNOSIS — I10 Essential (primary) hypertension: Secondary | ICD-10-CM | POA: Insufficient documentation

## 2013-03-24 DIAGNOSIS — I2510 Atherosclerotic heart disease of native coronary artery without angina pectoris: Secondary | ICD-10-CM | POA: Insufficient documentation

## 2013-03-24 DIAGNOSIS — R072 Precordial pain: Secondary | ICD-10-CM

## 2013-03-24 NOTE — Progress Notes (Signed)
Echocardiogram performed.  

## 2013-03-28 ENCOUNTER — Other Ambulatory Visit: Payer: Self-pay | Admitting: Cardiology

## 2013-03-28 ENCOUNTER — Telehealth: Payer: Self-pay | Admitting: Cardiology

## 2013-03-28 NOTE — Telephone Encounter (Signed)
New message   Patient calling echo test results

## 2013-03-28 NOTE — Telephone Encounter (Signed)
Pt is aware of echo results Copy forwarded to pcp Horton Chin RN

## 2013-04-03 ENCOUNTER — Telehealth: Payer: Self-pay | Admitting: *Deleted

## 2013-04-03 NOTE — Telephone Encounter (Signed)
No PA needed for brilinta per [email or web address]

## 2013-04-14 ENCOUNTER — Telehealth: Payer: Self-pay | Admitting: Cardiology

## 2013-04-14 NOTE — Telephone Encounter (Signed)
Follow Up    Following up of prior authorization for Brilinta. Please call.

## 2013-04-15 NOTE — Telephone Encounter (Signed)
Patient picked up a 2 week supply of brilinta, she had picked up her plavix that was called in for her to start back on after her bladder surgery,  so now she would have to pay out of pocket for the brilinta which is over $300.00, she has not taken any plavix but it was cheap and she wants to see if  Dr Percival Spanish would allow her to take this instead of brilinta, she is not going to have bladder surgery until July this summer due to things happening her life right now. I will route to Dr Percival Spanish and see if she can start on plavix instead of brilinta.

## 2013-04-15 NOTE — Telephone Encounter (Signed)
Note to Dr Percival Spanish sent

## 2013-04-15 NOTE — Telephone Encounter (Signed)
Left message with patient, per medication insurer BRILINTA does not need prior authorization.

## 2013-04-15 NOTE — Telephone Encounter (Signed)
Called [facility], patient normally pays a $45.00 co-pay for 30 day supply brilinta but since she's picked up her plavix its going to cost over $300.00. I have left her message regarding this.

## 2013-04-21 ENCOUNTER — Telehealth: Payer: Self-pay | Admitting: *Deleted

## 2013-04-21 MED ORDER — CLOPIDOGREL BISULFATE 75 MG PO TABS: 75.0000 mg | ORAL_TABLET | Freq: Every day | ORAL | Status: DC

## 2013-04-21 NOTE — Telephone Encounter (Signed)
Message copied by Fernande Boyden on Mon Apr 21, 2013 12:05 PM ------      Message from: Minus Breeding      Created: Sun Apr 20, 2013  9:01 PM      Regarding: RE: Brilinta vs plavix        She can use the Plavix.  If she is not having surgery soon she should habe started the Plavix.        ----- Message -----         From: Fernande Boyden, RN         Sent: 04/15/2013   4:43 PM           To: Minus Breeding, MD, Ellwood Dense, RN, #      Subject: Brilinta vs plavix                                       Dr Percival Spanish,            Patient  picked up her plavix that was called in for her to start back on after her bladder surgery,  so now she would have to pay out of pocket for the brilinta which is over $300.00, she has not taken any plavix but it was cheap and she wants to see if  You  would allow her to take this instead of brilinta, she is not going to have bladder surgery until July this summer due to things happening her life right now.       I'm not sure why she picked up the plavix instead of the brilinta but insurer wont pay now for brilinta.            Thanks, Lovett Sox, RN patient advocate       ------

## 2013-04-21 NOTE — Telephone Encounter (Signed)
Spoke with patient and instructed her OK to start plavix, instructed her when bladder surgery  coming close in summer review with Dr Percival Spanish her anticoagulation therapy guidelines/changes.

## 2013-04-29 ENCOUNTER — Emergency Department (HOSPITAL_COMMUNITY): Payer: Medicare HMO

## 2013-04-29 ENCOUNTER — Encounter (HOSPITAL_COMMUNITY): Payer: Self-pay | Admitting: Emergency Medicine

## 2013-04-29 ENCOUNTER — Emergency Department (HOSPITAL_COMMUNITY)
Admission: EM | Admit: 2013-04-29 | Discharge: 2013-04-30 | Disposition: A | Discharge status: Home / Self Care | Payer: Medicare HMO | Attending: Emergency Medicine | Admitting: Emergency Medicine

## 2013-04-29 DIAGNOSIS — Z7902 Long term (current) use of antithrombotics/antiplatelets: Secondary | ICD-10-CM | POA: Insufficient documentation

## 2013-04-29 DIAGNOSIS — I10 Essential (primary) hypertension: Secondary | ICD-10-CM | POA: Insufficient documentation

## 2013-04-29 DIAGNOSIS — Z8739 Personal history of other diseases of the musculoskeletal system and connective tissue: Secondary | ICD-10-CM | POA: Insufficient documentation

## 2013-04-29 DIAGNOSIS — Z7982 Long term (current) use of aspirin: Secondary | ICD-10-CM | POA: Insufficient documentation

## 2013-04-29 DIAGNOSIS — E785 Hyperlipidemia, unspecified: Secondary | ICD-10-CM | POA: Insufficient documentation

## 2013-04-29 DIAGNOSIS — F411 Generalized anxiety disorder: Secondary | ICD-10-CM | POA: Insufficient documentation

## 2013-04-29 DIAGNOSIS — F3289 Other specified depressive episodes: Secondary | ICD-10-CM | POA: Insufficient documentation

## 2013-04-29 DIAGNOSIS — K5900 Constipation, unspecified: Secondary | ICD-10-CM

## 2013-04-29 DIAGNOSIS — Z9861 Coronary angioplasty status: Secondary | ICD-10-CM | POA: Insufficient documentation

## 2013-04-29 DIAGNOSIS — F329 Major depressive disorder, single episode, unspecified: Secondary | ICD-10-CM | POA: Insufficient documentation

## 2013-04-29 DIAGNOSIS — Z87440 Personal history of urinary (tract) infections: Secondary | ICD-10-CM | POA: Insufficient documentation

## 2013-04-29 DIAGNOSIS — Z79899 Other long term (current) drug therapy: Secondary | ICD-10-CM | POA: Insufficient documentation

## 2013-04-29 DIAGNOSIS — Z8669 Personal history of other diseases of the nervous system and sense organs: Secondary | ICD-10-CM | POA: Insufficient documentation

## 2013-04-29 DIAGNOSIS — I2510 Atherosclerotic heart disease of native coronary artery without angina pectoris: Secondary | ICD-10-CM | POA: Insufficient documentation

## 2013-04-29 LAB — CBC WITH DIFFERENTIAL/PLATELET
BASOS ABS: 0 10*3/uL (ref 0.0–0.1)
Basophils Relative: 0 % (ref 0–1)
EOS PCT: 1 % (ref 0–5)
Eosinophils Absolute: 0.1 10*3/uL (ref 0.0–0.7)
HEMATOCRIT: 40.1 % (ref 36.0–46.0)
HEMOGLOBIN: 13.8 g/dL (ref 12.0–15.0)
Lymphocytes Relative: 11 % — ABNORMAL LOW (ref 12–46)
Lymphs Abs: 1.1 10*3/uL (ref 0.7–4.0)
MCH: 32.4 pg (ref 26.0–34.0)
MCHC: 34.4 g/dL (ref 30.0–36.0)
MCV: 94.1 fL (ref 78.0–100.0)
MONO ABS: 1.3 10*3/uL — AB (ref 0.1–1.0)
Monocytes Relative: 13 % — ABNORMAL HIGH (ref 3–12)
Neutro Abs: 7.8 10*3/uL — ABNORMAL HIGH (ref 1.7–7.7)
Neutrophils Relative %: 76 % (ref 43–77)
Platelets: 183 10*3/uL (ref 150–400)
RBC: 4.26 MIL/uL (ref 3.87–5.11)
RDW: 13.3 % (ref 11.5–15.5)
WBC: 10.2 10*3/uL (ref 4.0–10.5)

## 2013-04-29 LAB — URINALYSIS, ROUTINE W REFLEX MICROSCOPIC
BILIRUBIN URINE: NEGATIVE
Glucose, UA: NEGATIVE mg/dL
HGB URINE DIPSTICK: NEGATIVE
KETONES UR: NEGATIVE mg/dL
Nitrite: NEGATIVE
Protein, ur: NEGATIVE mg/dL
SPECIFIC GRAVITY, URINE: 1.008 (ref 1.005–1.030)
UROBILINOGEN UA: 0.2 mg/dL (ref 0.0–1.0)
pH: 6.5 (ref 5.0–8.0)

## 2013-04-29 LAB — I-STAT CHEM 8, ED
BUN: 12 mg/dL (ref 6–23)
CALCIUM ION: 1.2 mmol/L (ref 1.13–1.30)
CHLORIDE: 103 meq/L (ref 96–112)
CREATININE: 0.9 mg/dL (ref 0.50–1.10)
GLUCOSE: 112 mg/dL — AB (ref 70–99)
HCT: 42 % (ref 36.0–46.0)
Hemoglobin: 14.3 g/dL (ref 12.0–15.0)
Potassium: 4 mEq/L (ref 3.7–5.3)
Sodium: 138 mEq/L (ref 137–147)
TCO2: 24 mmol/L (ref 0–100)

## 2013-04-29 LAB — HEPATIC FUNCTION PANEL
ALK PHOS: 63 U/L (ref 39–117)
ALT: 31 U/L (ref 0–35)
AST: 30 U/L (ref 0–37)
Albumin: 3.8 g/dL (ref 3.5–5.2)
BILIRUBIN TOTAL: 0.4 mg/dL (ref 0.3–1.2)
Bilirubin, Direct: <0.2 mg/dL (ref 0.0–0.3)
Total Protein: 7.3 g/dL (ref 6.0–8.3)

## 2013-04-29 LAB — URINE MICROSCOPIC-ADD ON

## 2013-04-29 LAB — POC OCCULT BLOOD, ED: Fecal Occult Bld: NEGATIVE

## 2013-04-29 LAB — LIPASE, BLOOD: Lipase: 32 U/L (ref 11–59)

## 2013-04-29 MED ORDER — FLEET ENEMA 7-19 GM/118ML RE ENEM
1.0000 | ENEMA | Freq: Once | RECTAL | Status: AC
  Administered 2013-04-29: 1 via RECTAL
  Filled 2013-04-29: qty 1

## 2013-04-29 MED ORDER — LIDOCAINE HCL 2 % EX GEL
Freq: Once | CUTANEOUS | Status: AC
  Administered 2013-04-30: 1
  Filled 2013-04-29: qty 10

## 2013-04-29 MED ORDER — ACETAMINOPHEN 325 MG PO TABS
650.0000 mg | ORAL_TABLET | Freq: Once | ORAL | Status: AC
  Administered 2013-04-30: 650 mg via ORAL
  Filled 2013-04-29: qty 2

## 2013-04-29 MED ORDER — IOHEXOL 300 MG/ML  SOLN
100.0000 mL | Freq: Once | INTRAMUSCULAR | Status: AC | PRN
  Administered 2013-04-29: 100 mL via INTRAVENOUS

## 2013-04-29 MED ORDER — MORPHINE SULFATE 4 MG/ML IJ SOLN: 4.0000 mg | Freq: Once | INTRAMUSCULAR | Status: DC

## 2013-04-29 MED ORDER — SODIUM CHLORIDE 0.9 % IV BOLUS (SEPSIS)
1000.0000 mL | Freq: Once | INTRAVENOUS | Status: AC
  Administered 2013-04-29: 1000 mL via INTRAVENOUS

## 2013-04-29 MED ORDER — IOHEXOL 300 MG/ML  SOLN
50.0000 mL | Freq: Once | INTRAMUSCULAR | Status: AC | PRN
  Administered 2013-04-29: 50 mL via ORAL

## 2013-04-29 NOTE — ED Notes (Signed)
Pt c/o constipation and gen crampy abd pain since Friday.

## 2013-04-29 NOTE — ED Provider Notes (Signed)
CSN: [id number]     Arrival date & time 04/29/13  1726 History   First MD Initiated Contact with Patient 04/29/13 1952     Chief Complaint  Patient presents with  . Abdominal Pain  . Constipation     (Consider location/radiation/quality/duration/timing/severity/associated sxs/prior Treatment) HPI  80 year old female history of recurrent UTI, self cath, diverticulosis, hypertension who presents complaining of abdominal pain and constipation.  Patient reports she usually had a bowel movement everyday or every other day. For the past 5 days she has not had a bowel movement. States she endorses low, cramping, having urge to have a bowel movement and also having rectal pain without producing bowel movement. This is new for her. She is able to pass flatus. she does endorse nausea without vomiting. She denies fever, chills, chest pain, shortness of breath, productive cough, back pain, dysuria, numbness or weakness. She has tried using Dulcolax, Senokot, Fleet enema, and classroom suppository since yesterday without relief. She denies any prior history of cancer. Denies any abnormal weight changes, night sweats, or myalgia. No prior abdominal surgery with exception of abdominal hysterectomy in 1980. Does have history of CAD with stent placement  Past Medical History  Diagnosis Date  . Hypertension   . Self-catheterizes urinary bladder   . UTI (lower urinary tract infection)   . Macular degeneration   . CAD in native artery     a. NSTEMI 2/14 => LHC 03/08/12: Mid LAD 95% (hazy-suggestive of a ruptured plaque), EF 40%, distal anterior, apical and distal inferior HK =>  PCI: Xience DES to the mid LAD.  [name] Cardiomyopathy     LVEF 45% on cath/echo 03/2012, suspected to be ischemic vs stress-induced  . Mild mitral regurgitation     a. Echo 03/09/12: Mild focal basal hypertrophy the septum, EF 45-50%, distal anteroseptal, anterolateral, inferolateral, inferoseptal and apical HK, grade 2 diastolic dysfunction,  mild MR, PASP 52  . Hyperlipidemia   . Anxiety   . Bilateral bunions   . Osteoporosis   . Macular degeneration   . Depression   . Diverticulosis    Past Surgical History  Procedure Laterality Date  . Abdominal hysterectomy  1980  . Back surgery  1986  . Coronary angioplasty with stent placement  03/11/12    95% mid LAD stenosis s/p DES, minimal nonobs dz elsewhere; LVEF 40%, moderate hypokinesis of distal anterior, apical and distal inferior walls, mildly elevated filling pressures, mild pulmonary HTN  . Metatarsal osteotomy  2012    5th foot  . Reclast  2013  . Foot surgery Right 2012   Family History  Problem Relation Age of Onset  . Colon cancer Father   . Hypertension Mother   . CAD Mother   . Heart attack Mother   . CAD Brother 31    CABG  . Cancer - Lung Brother   . Cancer - Lung Sister   . Hypertension Paternal Grandmother   . Breast cancer Paternal Grandfather    History  Substance Use Topics  . Smoking status: Never Smoker   . Smokeless tobacco: Never Used  . Alcohol Use: Yes     Comment: 4 glasses of wine per week   OB History   Grav Para Term Preterm Abortions TAB SAB Ect Mult Living                 Review of Systems  All other systems reviewed and are negative.      Allergies  Estrogens; Pneumococcal vaccines; Bee venom;  and Sulfa antibiotics  Home Medications   Current Outpatient Rx  Name  Route  Sig  Dispense  Refill  . ALPRAZolam (XANAX) 0.25 MG tablet   Oral   Take 0.25 mg by mouth at bedtime as needed for anxiety.         [name] aspirin 81 MG tablet   Oral   Take 81 mg by mouth daily.         [name] atorvastatin (LIPITOR) 80 MG tablet   Oral   Take 1 tablet (80 mg total) by mouth daily.   90 tablet   3   . bisacodyl (BISACODYL) 5 MG EC tablet   Oral   Take 5 mg by mouth once.         . cholecalciferol (VITAMIN D) 1000 UNITS tablet   Oral   Take 1,000 Units by mouth daily.          [name] glycerin adult (GLYCERIN ADULT) 2 G  SUPP   Rectal   Place 1 suppository rectally once as needed for moderate constipation.         [name] lisinopril (PRINIVIL,ZESTRIL) 5 MG tablet   Oral   Take 1 tablet (5 mg total) by mouth every morning.   90 tablet   3   . metoprolol tartrate (LOPRESSOR) 25 MG tablet   Oral   Take 25 mg by mouth 2 (two) times daily.         . Multiple Vitamins-Minerals (ICAPS) CAPS   Oral   Take 1 capsule by mouth every morning.         . nitrofurantoin (MACRODANTIN) 100 MG capsule   Oral   Take 100 mg by mouth every other day.          . nitroGLYCERIN (NITROSTAT) 0.4 MG SL tablet   Sublingual   Place 1 tablet (0.4 mg total) under the tongue every 5 (five) minutes x 3 doses as needed for chest pain.   25 tablet   3   . Omega-3 Fatty Acids (FISH OIL) 1000 MG CAPS   Oral   Take 1 capsule by mouth daily.         [name] senna-docusate (SENOKOT-S) 8.6-50 MG per tablet   Oral   Take 2 tablets by mouth once.         . sodium phosphate (FLEET) enema   Rectal   Place 1 enema rectally once. follow package directions         . Ticagrelor (BRILINTA) 90 MG TABS tablet   Oral   Take 90 mg by mouth 2 (two) times daily.         . clopidogrel (PLAVIX) 75 MG tablet   Oral   Take 1 tablet (75 mg total) by mouth daily with breakfast.   90 tablet   1    BP 150/92  Pulse 102  Temp(Src) 98.4 F (36.9 C) (Oral)  Resp 16  SpO2 97% Physical Exam  Nursing note and vitals reviewed. Constitutional: She appears well-developed and well-nourished. No distress.  Awake, alert, nontoxic appearance  HENT:  Head: Atraumatic.  Eyes: Conjunctivae are normal. Right eye exhibits no discharge. Left eye exhibits no discharge.  Neck: Neck supple.  Cardiovascular: Normal rate and regular rhythm.   Pulmonary/Chest: Effort normal. No respiratory distress. She exhibits no tenderness.  Abdominal: Soft. Bowel sounds are normal. She exhibits distension. There is no tenderness (Mild left lower quadrant  tenderness, no guarding no rebound tenderness). There is no rebound and no guarding.  Genitourinary:  Chaperone present: Patient has normal rectal tone, stool noted in the rectal vault, no obvious mass, Hemoccult negative.  Musculoskeletal: She exhibits no tenderness.  ROM appears intact, no obvious focal weakness  Neurological:  Mental status and motor strength appears intact  Skin: No rash noted.  Psychiatric: She has a normal mood and affect.    ED Course  Fecal disimpaction Date/Time: 04/29/2013 11:33 PM Performed by: Domenic Moras Authorized by: Domenic Moras Consent: Verbal consent obtained. Risks and benefits: risks, benefits and alternatives were discussed Consent given by: patient Patient understanding: patient states understanding of the procedure being performed Patient consent: the patient's understanding of the procedure matches consent given Patient identity confirmed: verbally with patient and arm band Local anesthesia used: no Patient sedated: no Comments: Manual disimpaction performed by me.  Lido Jelly given for comfort.  Small amount of hard stool were evacuated from rectal vault.  Will have patient use bedside commode.     (including critical care time)  11:06 PM Patient presents complaining of constipation for the past 5 days. She does have a mildly distended abdomen and no abdominal tenderness on exam. She has moderate amount of stools in the rectal vault. Acute abdominal series demonstrated no evidence of small bowel obstruction. She is afebrile with stable normal vital signs. Plan to perform manual disimpaction. CT scan of the abdomen and pelvis ordered. Care discussed with Dr. Eulis Foster.   12:38 AM Disimpaction was performed prior to the CT scan. CT scan of the abdomen and pelvis demonstrating marked circumferential submucosa edema and mucosal hyper enhancement in the distal rectum. Differential consideration include reactive change secondary to reported disimpaction  procedure, residual inflammatory change secondary to stucoral colitis and active infectious/inflammatory proctitis. This finding is likely reflect my recent manual disimpaction procedure. Low suspicion for colitis or proctitis given that pt has not had fever, normal WBC and having recent manual disimpaction that explains the result.  Since pt able to produce BM.  i recommend using miralax, can use her stool softener at home and f/u closely with PCP.  Pt voice understanding and agrees with plan.    Labs Review Labs Reviewed  CBC WITH DIFFERENTIAL - Abnormal; Notable for the following:    Neutro Abs 7.8 (*)    Lymphocytes Relative 11 (*)    Monocytes Relative 13 (*)    Monocytes Absolute 1.3 (*)    All other components within normal limits  URINALYSIS, ROUTINE W REFLEX MICROSCOPIC - Abnormal; Notable for the following:    Leukocytes, UA SMALL (*)    All other components within normal limits  I-STAT CHEM 8, ED - Abnormal; Notable for the following:    Glucose, Bld 112 (*)    All other components within normal limits  LIPASE, BLOOD  HEPATIC FUNCTION PANEL  URINE MICROSCOPIC-ADD ON  POC OCCULT BLOOD, ED   Imaging Review Ct Abdomen Pelvis W Contrast  04/30/2013   CLINICAL DATA:  Abdominal pain, constipation  EXAM: CT ABDOMEN AND PELVIS WITH CONTRAST  TECHNIQUE: Multidetector CT imaging of the abdomen and pelvis was performed using the standard protocol following bolus administration of intravenous contrast.  CONTRAST:  88mL OMNIPAQUE IOHEXOL 300 MG/ML SOLN, 136mL OMNIPAQUE IOHEXOL 300 MG/ML SOLN  COMPARISON:  Acute abdominal series obtained yesterday ; prior CT abdomen/ pelvis 09/12/2011  FINDINGS: Lower Chest: Stable punctate calcifications in the posterior inferior left lower lobe can subpleural fat suggesting the sequelae of prior infectious/inflammatory process. The visualized lung bases are otherwise clear. Visualized cardiac structures within  normal limits for size. No pericardial effusion.  Unremarkable visualized distal thoracic esophagus.  Abdomen: Unremarkable CT appearance of the stomach, duodenum, spleen, and pancreatic parenchyma. The main pancreatic duct is mildly dilated to 3 mm. The duct becomes fairly tortuous in the pancreatic head and uncinate process. No definite mass or inflammation. Stable 2.7 cm benign hepatic cyst in segment 8. Additional scattered subcentimeter low-attenuation hepatic lesions noted throughout both the left and right hepatic lobes all of which remain unchanged dating back to 09/12/2011. Gallbladder is unremarkable. No intra or extrahepatic biliary ductal dilatation. No hydronephrosis, nephrolithiasis or enhancing renal mass. Stable 2.2 cm left renal cyst.  Diffuse submucosal edema and hyper enhancement of the mucosa lining in the distal rectum. Normal appendix in the right lower quadrant. No evidence of obstruction. No focal small bowel abnormality. No free fluid or suspicious adenopathy.  Pelvis: Markedly distended urinary bladder. Surgical changes of prior hysterectomy. Mild pelvic floor laxity.  Bones/Soft Tissues: No acute fracture or aggressive appearing lytic or blastic osseous lesion. Stable appearance of remote L1, L3 and L4 compression fractures with advanced multilevel degenerative disc disease and focal levoconvex lumbar scoliosis at L3-L4 compared to prior. No significant interval progression.  Vascular: Tortuous vascular anatomy. No aneurysmal dilatation. Atherosclerotic calcification noted in the infrarenal abdominal aorta.  IMPRESSION: 1. Marked circumferential submucosal edema and mucosal hyper enhancement in the distal rectum. Differential considerations include reactive changes secondary to reported disimpaction procedure, residual inflammatory changes secondary to stercoral colitis, and active infectious/inflammatory proctitis. 2. Additional ancillary findings as above without significant interval change.   Electronically Signed   By: Jacqulynn Cadet M.D.   On: 04/30/2013 00:23   Dg Abd Acute W/chest  04/29/2013   CLINICAL DATA:  Hypertension, abdominal pain, constipation  EXAM: ACUTE ABDOMEN SERIES (ABDOMEN 2 VIEW & CHEST 1 VIEW)  COMPARISON:  03/08/2012, 09/12/2011  FINDINGS: Stable hyperinflation, suspect COPD/emphysema. Basilar scarring evident. No focal pneumonia, collapse or consolidation. Negative for edema, effusion or pneumothorax. Trachea is midline. Tortuosity of the aorta evident. Stable sclerotic bone island in the left humerus. No free air evident on the decubitus view. Scattered air and stool throughout the bowel. Negative for obstruction. No significant dilatation or ileus. Degenerative changes of the spine with a chronic severe scoliosis. No acute osseous finding. Left hemipelvis calcification consistent with venous phleboliths.  IMPRESSION: Negative for obstruction or free air.  Chronic changes as described.  No definite acute process   Electronically Signed   By: Daryll Brod M.D.   On: 04/29/2013 20:51     EKG Interpretation None      MDM   Final diagnoses:  Constipation    BP 145/79  Pulse 89  Temp(Src) 98.4 F (36.9 C) (Oral)  Resp 16  SpO2 98%  I have reviewed nursing notes and vital signs. I personally reviewed the imaging tests through PACS system  I reviewed available ER/hospitalization records thought the EMR     Domenic Moras, [place] 04/30/13 0041

## 2013-04-30 MED ORDER — POLYETHYLENE GLYCOL 3350 17 G PO PACK: 17.0000 g | PACK | Freq: Every day | ORAL | Status: DC

## 2013-04-30 NOTE — ED Provider Notes (Signed)
Medical screening examination/treatment/procedure(s) were performed by non-physician practitioner and as supervising physician I was immediately available for consultation/collaboration.   EKG Interpretation None       Richarda Blade, MD 04/30/13 1836

## 2013-04-30 NOTE — Discharge Instructions (Signed)
Constipation, Adult Constipation is when a person has fewer than 3 bowel movements a week; has difficulty having a bowel movement; or has stools that are dry, hard, or larger than normal. As people grow older, constipation is more common. If you try to fix constipation with medicines that make you have a bowel movement (laxatives), the problem may get worse. Long-term laxative use may cause the muscles of the colon to become weak. A low-fiber diet, not taking in enough fluids, and taking certain medicines may make constipation worse. CAUSES   Certain medicines, such as antidepressants, pain medicine, iron supplements, antacids, and water pills.   Certain diseases, such as diabetes, irritable bowel syndrome (IBS), thyroid disease, or depression.   Not drinking enough water.   Not eating enough fiber-rich foods.   Stress or travel.  Lack of physical activity or exercise.  Not going to the restroom when there is the urge to have a bowel movement.  Ignoring the urge to have a bowel movement.  Using laxatives too much. SYMPTOMS   Having fewer than 3 bowel movements a week.   Straining to have a bowel movement.   Having hard, dry, or larger than normal stools.   Feeling full or bloated.   Pain in the lower abdomen.  Not feeling relief after having a bowel movement. DIAGNOSIS  Your caregiver will take a medical history and perform a physical exam. Further testing may be done for severe constipation. Some tests may include:   A barium enema X-ray to examine your rectum, colon, and sometimes, your small intestine.  A sigmoidoscopy to examine your lower colon.  A colonoscopy to examine your entire colon. TREATMENT  Treatment will depend on the severity of your constipation and what is causing it. Some dietary treatments include drinking more fluids and eating more fiber-rich foods. Lifestyle treatments may include regular exercise. If these diet and lifestyle recommendations  do not help, your caregiver may recommend taking over-the-counter laxative medicines to help you have bowel movements. Prescription medicines may be prescribed if over-the-counter medicines do not work.  HOME CARE INSTRUCTIONS   Increase dietary fiber in your diet, such as fruits, vegetables, whole grains, and beans. Limit high-fat and processed sugars in your diet, such as [place] fries, hamburgers, cookies, candies, and soda.   A fiber supplement may be added to your diet if you cannot get enough fiber from foods.   Drink enough fluids to keep your urine clear or pale yellow.   Exercise regularly or as directed by your caregiver.   Go to the restroom when you have the urge to go. Do not hold it.  Only take medicines as directed by your caregiver. Do not take other medicines for constipation without talking to your caregiver first. [place] IF:   You have bright red blood in your stool.   Your constipation lasts for more than 4 days or gets worse.   You have abdominal or rectal pain.   You have thin, pencil-like stools.  You have unexplained weight loss. MAKE SURE YOU:   Understand these instructions.  Will watch your condition.  Will get help right away if you are not doing well or get worse. Document Released: 10/15/2003 Document Revised: 04/10/2011 Document Reviewed: 10/28/2012 [facility] Patient Information 2014 [place], [place].  Fiber Content in Foods Drinking plenty of fluids and consuming foods high in fiber can help with constipation. See the list below for the fiber content of some common foods. Starches and Grains / Dietary  Fiber (g)  Cheerios, 1 cup / 3 g  Kellogg's Corn Flakes, 1 cup / 0.7 g  Rice Krispies, 1  cup / 0.3 g  Quaker Oat Life Cereal,  cup / 2.1 g  Oatmeal, instant (cooked),  cup / 2 g  Kellogg's Frosted Mini Wheats, 1 cup / 5.1 g  Rice, brown, long-grain (cooked), 1 cup / 3.5 g  Rice, white, long-grain (cooked),  1 cup / 0.6 g  Macaroni, cooked, enriched, 1 cup / 2.5 g Legumes / Dietary Fiber (g)  Beans, baked, canned, plain or vegetarian,  cup / 5.2 g  Beans, kidney, canned,  cup / 6.8 g  Beans, pinto, dried (cooked),  cup / 7.7 g  Beans, pinto, canned,  cup / 5.5 g Breads and Crackers / Dietary Fiber (g)  Graham crackers, plain or honey, 2 squares / 0.7 g  Saltine crackers, 3 squares / 0.3 g  Pretzels, plain, salted, 10 pieces / 1.8 g  Bread, whole-wheat, 1 slice / 1.9 g  Bread, white, 1 slice / 0.7 g  Bread, raisin, 1 slice / 1.2 g  Bagel, plain, 3 oz / 2 g  Tortilla, flour, 1 oz / 0.9 g  Tortilla, corn, 1 small / 1.5 g  Bun, hamburger or hotdog, 1 small / 0.9 g Fruits / Dietary Fiber (g)  Apple, raw with skin, 1 medium / 4.4 g  Applesauce, sweetened,  cup / 1.5 g  Banana,  medium / 1.5 g  Grapes, 10 grapes / 0.4 g  Orange, 1 small / 2.3 g  Raisin, 1.5 oz / 1.6 g  Melon, 1 cup / 1.4 g Vegetables / Dietary Fiber (g)  Green beans, canned,  cup / 1.3 g  Carrots (cooked),  cup / 2.3 g  Broccoli (cooked),  cup / 2.8 g  Peas, frozen (cooked),  cup / 4.4 g  Potatoes, mashed,  cup / 1.6 g  Lettuce, 1 cup / 0.5 g  Corn, canned,  cup / 1.6 g  Tomato,  cup / 1.1 g Document Released: 06/04/2006 Document Revised: 04/10/2011 Document Reviewed: 07/30/2006 [employer] Patient Information 2014 [place], [place].

## 2013-05-28 ENCOUNTER — Telehealth: Payer: Self-pay | Admitting: Cardiology

## 2013-05-28 NOTE — Telephone Encounter (Signed)
Spoke with pt who states on Monday at 3 pm she developed leg weakness and her hands were shaking.  She states she has never felt like this before and wonders if the symptoms were caused by her Plavix.  She was changed from [place] to Plavix about 1 month ago due to cost.  Advised pt Plavix does not cause symptoms like this and she should restart her medication (she has been holding it for two days).  Advised if s/s reoccur she should call her PCP.  At the time when she had the symptoms she reports her BP and HR were "fine."  She is not diabetic but wonders if her BS was low.

## 2013-05-28 NOTE — Telephone Encounter (Signed)
New message         Pt hands are shaking and pain in her stomach. Is this because of the change in medication from brilinta to plavix?

## 2013-05-30 HISTORY — PX: CARPAL TUNNEL RELEASE: SHX101

## 2013-06-09 ENCOUNTER — Telehealth: Payer: Self-pay | Admitting: Neurology

## 2013-06-09 DIAGNOSIS — G5600 Carpal tunnel syndrome, unspecified upper limb: Secondary | ICD-10-CM

## 2013-06-09 NOTE — Telephone Encounter (Signed)
Spoke with patient and she said that it is the same pain seen for at last OV,-(02/18/13), has gotten worse. She is only taking  Only tylenol at night for the pain

## 2013-06-09 NOTE — Telephone Encounter (Signed)
We will set up the appointment with Dr. Grandville Silos.

## 2013-06-09 NOTE — Telephone Encounter (Signed)
I called patient. The carpal tunnel syndrome symptoms have worsened, right greater than left. She has had nerve conduction studies previously that document this. I will get her for to a hand surgeon at this time.

## 2013-06-09 NOTE — Telephone Encounter (Signed)
Patient needs soon appt--having problems with pain in arms and hands--cannot sleep because of this--please call--thank you.

## 2013-06-09 NOTE — Telephone Encounter (Signed)
Patient called to state that her insurance will not cover Dr. Sypher.  She checked into it and her insurance will cover under Dr. David Thompson at [facility] telephone  [phone]  Or Dr. Mark Warburton in [place] - patient prefers to go to [place].  °

## 2013-06-09 NOTE — Telephone Encounter (Signed)
Patient called to state that her insurance will not cover Dr. Daylene Katayama.  She checked into it and her insurance will cover under Dr. Milly Jakob at [place] telephone  [phone]  Or Dr. Ames Coupe in [facility] - patient prefers to go to [place].

## 2013-06-10 ENCOUNTER — Other Ambulatory Visit: Payer: Self-pay | Admitting: Family Medicine

## 2013-07-22 ENCOUNTER — Other Ambulatory Visit: Payer: Self-pay | Admitting: Family Medicine

## 2013-07-30 ENCOUNTER — Other Ambulatory Visit (HOSPITAL_COMMUNITY): Payer: Self-pay | Admitting: Family Medicine

## 2013-07-30 ENCOUNTER — Ambulatory Visit (HOSPITAL_COMMUNITY)
Admission: RE | Admit: 2013-07-30 | Discharge: 2013-07-30 | Disposition: A | Discharge status: Home / Self Care | Payer: Medicare HMO | Source: Ambulatory Visit | Attending: Family Medicine | Admitting: Family Medicine

## 2013-07-30 ENCOUNTER — Encounter (HOSPITAL_COMMUNITY): Payer: Self-pay

## 2013-07-30 DIAGNOSIS — M810 Age-related osteoporosis without current pathological fracture: Secondary | ICD-10-CM | POA: Insufficient documentation

## 2013-07-30 HISTORY — DX: Other injury of unspecified body region, initial encounter: T14.8XXA

## 2013-07-30 MED ORDER — ZOLEDRONIC ACID 5 MG/100ML IV SOLN
5.0000 mg | Freq: Once | INTRAVENOUS | Status: AC
  Administered 2013-07-30: 5 mg via INTRAVENOUS
  Filled 2013-07-30: qty 100

## 2013-07-30 MED ORDER — SODIUM CHLORIDE 0.9 % IV SOLN
INTRAVENOUS | Status: DC
  Administered 2013-07-30: 13:00:00 via INTRAVENOUS

## 2013-07-30 NOTE — Discharge Instructions (Signed)

## 2013-08-08 NOTE — Telephone Encounter (Signed)
Closed encounter °

## 2013-09-29 ENCOUNTER — Ambulatory Visit: Payer: Medicare HMO | Admitting: Cardiology

## 2013-10-21 ENCOUNTER — Encounter: Payer: Self-pay | Admitting: Cardiology

## 2013-10-21 ENCOUNTER — Ambulatory Visit (INDEPENDENT_AMBULATORY_CARE_PROVIDER_SITE_OTHER): Payer: Medicare HMO | Admitting: Cardiology

## 2013-10-21 VITALS — BP 136/88 | HR 65 | Ht 66.0 in | Wt 151.5 lb

## 2013-10-21 DIAGNOSIS — E7800 Pure hypercholesterolemia, unspecified: Secondary | ICD-10-CM

## 2013-10-21 DIAGNOSIS — E782 Mixed hyperlipidemia: Secondary | ICD-10-CM

## 2013-10-21 DIAGNOSIS — I2510 Atherosclerotic heart disease of native coronary artery without angina pectoris: Secondary | ICD-10-CM

## 2013-10-21 NOTE — Patient Instructions (Signed)
Your physician recommends that you schedule a follow-up appointment in: august 2016 with Dr. Allena Napoleon are to have a Lipid profile at your Primary care office

## 2013-10-21 NOTE — Progress Notes (Signed)
HPI The patient presents for evaluation of chest discomfort. She recently had PCI of an LAD ruptured plaque. At that time she presented with jaw pain.  At a follow up visit she had some atypical pain. However, exercise treadmill testing was fine. She's been having some arm numbness on her right arm and has been diagnosed with carpal tunnel syndrome.  She is in need of bladder surgery. She's not been getting any symptoms consistent with her previous angina. She's able to do activities without any chest pressure, neck or arm discomfort. However, she is not exercising as much as I would like. She's not describing shortness of breath, PND or orthopnea. She has had no palpitations, presyncope or syncope.  Allergies  Allergen Reactions  . Estrogens     Breast pain  . Pneumococcal Vaccines     rash  . Bee Venom Rash  . Sulfa Antibiotics Rash    Current Outpatient Prescriptions  Medication Sig Dispense Refill  . ALPRAZolam (XANAX) 0.25 MG tablet Take 0.25 mg by mouth at bedtime as needed for anxiety.      [name] aspirin 81 MG tablet Take 81 mg by mouth daily.      [name] atorvastatin (LIPITOR) 80 MG tablet Take 1 tablet (80 mg total) by mouth daily.  90 tablet  3  . bisacodyl (BISACODYL) 5 MG EC tablet Take 5 mg by mouth once.      . cholecalciferol (VITAMIN D) 1000 UNITS tablet Take 1,000 Units by mouth daily.       . clopidogrel (PLAVIX) 75 MG tablet Take 1 tablet (75 mg total) by mouth daily with breakfast.  90 tablet  1  . lisinopril (PRINIVIL,ZESTRIL) 5 MG tablet Take 1 tablet (5 mg total) by mouth every morning.  90 tablet  3  . metoprolol tartrate (LOPRESSOR) 25 MG tablet Take 25 mg by mouth 2 (two) times daily.      . Multiple Vitamins-Minerals (ICAPS) CAPS Take 1 capsule by mouth every morning.      . nitrofurantoin (MACRODANTIN) 100 MG capsule Take 100 mg by mouth every other day.       . nitroGLYCERIN (NITROSTAT) 0.4 MG SL tablet Place 1 tablet (0.4 mg total) under the tongue every 5 (five)  minutes x 3 doses as needed for chest pain.  25 tablet  3  . Omega-3 Fatty Acids (FISH OIL) 1000 MG CAPS Take 1 capsule by mouth daily.       No current facility-administered medications for this visit.    Past Medical History  Diagnosis Date  . Hypertension   . Self-catheterizes urinary bladder   . UTI (lower urinary tract infection)   . Macular degeneration   . CAD in native artery     a. NSTEMI 2/14 => LHC 03/08/12: Mid LAD 95% (hazy-suggestive of a ruptured plaque), EF 40%, distal anterior, apical and distal inferior HK =>  PCI: Xience DES to the mid LAD.  [name] Cardiomyopathy     LVEF 45% on cath/echo 03/2012, suspected to be ischemic vs stress-induced  . Mild mitral regurgitation     a. Echo 03/09/12: Mild focal basal hypertrophy the septum, EF 45-50%, distal anteroseptal, anterolateral, inferolateral, inferoseptal and apical HK, grade 2 diastolic dysfunction, mild MR, PASP 52  . Hyperlipidemia   . Anxiety   . Bilateral bunions   . Osteoporosis   . Macular degeneration   . Depression   . Diverticulosis   . Fracture 2013    Right foot fracture- did not require  surgery    Past Surgical History  Procedure Laterality Date  . Abdominal hysterectomy  1980  . Back surgery  1986  . Coronary angioplasty with stent placement  03/11/12    95% mid LAD stenosis s/p DES, minimal nonobs dz elsewhere; LVEF 40%, moderate hypokinesis of distal anterior, apical and distal inferior walls, mildly elevated filling pressures, mild pulmonary HTN  . Reclast  2013  . Carpal tunnel release Right MAy 2015    ROS:  As stated in the HPI and negative for all other systems.  PHYSICAL EXAM BP 136/88  Pulse 65  Ht 5\' 6"  (1.676 m)  Wt 151 lb 8 oz (68.72 kg)  BMI 24.46 kg/m2 GENERAL:  Well appearing NECK:  No jugular venous distention, waveform within normal limits, carotid upstroke brisk and symmetric, no bruits, no thyromegaly LYMPHATICS:  No cervical, inguinal adenopathy LUNGS:  Clear to auscultation  bilaterally CHEST:  Unremarkable HEART:  PMI not displaced or sustained,S1 and S2 within normal limits, no S3, no S4, no clicks, no rubs, no murmurs ABD:  Flat, positive bowel sounds normal in frequency in pitch, no bruits, no rebound, no guarding, no midline pulsatile mass, no hepatomegaly, no splenomegaly EXT:  2 plus pulses throughout, no edema, no cyanosis no clubbing  EKG:  Sinus rhythm, rate 65, axis within normal limits, intervals within normal limits, no acute ST-T wave changes.  10/21/2013   ASSESSMENT AND PLAN  CAD:  She's had no new symptoms. She will continue his secondary risk reduction.  Because of the nature of her acute MI ongoing to continue her dual antiplatelet therapy for at least 30 months. She does office to discuss whether she can just change to ASA.   CARDIOMYOPATHY:  Her EF was better in Feb when we checked.  She will continue with meds as listed.   HTN:  The blood pressure is at target. No change in medications is indicated. We will continue with therapeutic lifestyle changes (TLC).  HYPERLIPIDEMIA: I have asked her to get a repeat lipid profile at [facility], MD's office.

## 2013-11-18 ENCOUNTER — Telehealth: Payer: Self-pay | Admitting: Cardiology

## 2013-11-18 NOTE — Telephone Encounter (Signed)
Please call before 12 if possible. Pt is having blood in her urine,need to know if she can stop her blood thinner?

## 2013-11-18 NOTE — Telephone Encounter (Signed)
Pt. Having a lot of blood in her urin, pt has a stent in place and is taking plavix, pt. Is going to see Dr. Andres Shad PA today at 3:15

## 2013-11-19 NOTE — Telephone Encounter (Signed)
We will wait and see what Dr. Gaynelle Arabian says.

## 2013-11-20 NOTE — Telephone Encounter (Signed)
Noted, pt, called and informed

## 2013-12-01 ENCOUNTER — Telehealth: Payer: Self-pay | Admitting: Cardiology

## 2013-12-01 DIAGNOSIS — E785 Hyperlipidemia, unspecified: Secondary | ICD-10-CM

## 2013-12-01 NOTE — Telephone Encounter (Signed)
Returned call to patient she stated she wanted to have fasting lab work tomorrow since she will be off work.Lipid profile order entered for [facility] lab at [employer].

## 2013-12-01 NOTE — Telephone Encounter (Signed)
New message     Pt did not have her labs drawn on 10-21-13.  She want to come in tomorrow but the order is not current.  Can pt have labs drawn tomorrow at [facility]?

## 2013-12-02 LAB — LIPID PANEL
CHOL/HDL RATIO: 2.3 ratio
Cholesterol: 141 mg/dL (ref 0–200)
HDL: 61 mg/dL (ref >39)
LDL Cholesterol: 56 mg/dL (ref 0–99)
TRIGLYCERIDES: 119 mg/dL (ref <150)
VLDL: 24 mg/dL (ref 0–40)

## 2013-12-03 ENCOUNTER — Telehealth: Payer: Self-pay | Admitting: Cardiology

## 2013-12-03 NOTE — Telephone Encounter (Signed)
New message ° ° ° ° °Want lab results °

## 2013-12-03 NOTE — Telephone Encounter (Signed)
Patient notified of labs by Eliezer Lofts, RN- documented on lab results.

## 2013-12-17 ENCOUNTER — Encounter: Payer: Self-pay | Admitting: Neurology

## 2013-12-23 ENCOUNTER — Encounter: Payer: Self-pay | Admitting: Neurology

## 2014-01-08 ENCOUNTER — Encounter (HOSPITAL_COMMUNITY): Payer: Self-pay | Admitting: Cardiovascular Disease

## 2014-01-12 ENCOUNTER — Other Ambulatory Visit: Payer: Self-pay

## 2014-01-12 MED ORDER — ATORVASTATIN CALCIUM 80 MG PO TABS: 80.0000 mg | ORAL_TABLET | Freq: Every day | ORAL | Status: DC

## 2014-04-03 ENCOUNTER — Other Ambulatory Visit: Payer: Self-pay

## 2014-04-03 MED ORDER — CLOPIDOGREL BISULFATE 75 MG PO TABS: 75.0000 mg | ORAL_TABLET | Freq: Every day | ORAL | Status: DC

## 2014-04-03 MED ORDER — METOPROLOL TARTRATE 25 MG PO TABS: 25.0000 mg | ORAL_TABLET | Freq: Two times a day (BID) | ORAL | Status: DC

## 2014-04-22 ENCOUNTER — Other Ambulatory Visit: Payer: Self-pay | Admitting: *Deleted

## 2014-04-22 MED ORDER — LISINOPRIL 5 MG PO TABS: 5.0000 mg | ORAL_TABLET | Freq: Every morning | ORAL | Status: DC

## 2014-07-14 ENCOUNTER — Other Ambulatory Visit: Payer: Self-pay

## 2014-07-14 MED ORDER — NITROGLYCERIN 0.4 MG SL SUBL: 0.4000 mg | SUBLINGUAL_TABLET | SUBLINGUAL | Status: DC | PRN

## 2014-07-24 ENCOUNTER — Other Ambulatory Visit: Payer: Self-pay | Admitting: Family Medicine

## 2014-08-14 ENCOUNTER — Encounter (HOSPITAL_COMMUNITY): Payer: Self-pay

## 2014-08-14 ENCOUNTER — Ambulatory Visit (HOSPITAL_COMMUNITY)
Admission: RE | Admit: 2014-08-14 | Discharge: 2014-08-14 | Disposition: A | Discharge status: Home / Self Care | Payer: PPO | Source: Ambulatory Visit | Attending: Family Medicine | Admitting: Family Medicine

## 2014-08-14 DIAGNOSIS — M810 Age-related osteoporosis without current pathological fracture: Secondary | ICD-10-CM | POA: Insufficient documentation

## 2014-08-14 MED ORDER — SODIUM CHLORIDE 0.9 % IV SOLN
INTRAVENOUS | Status: DC
  Administered 2014-08-14: 250 mL via INTRAVENOUS

## 2014-08-14 MED ORDER — ZOLEDRONIC ACID 5 MG/100ML IV SOLN
5.0000 mg | Freq: Once | INTRAVENOUS | Status: AC
  Administered 2014-08-14: 5 mg via INTRAVENOUS
  Filled 2014-08-14: qty 100

## 2014-08-14 NOTE — Discharge Instructions (Signed)
Drink fluids/water as tolerated over next 72hrs °Tylenol or Ibuprofen OTC as directed °Continue calcium and Vit D as directed by your MDZoledronic Acid injection (Paget's Disease, Osteoporosis) °What is this medicine? °ZOLEDRONIC ACID (ZOE le dron ik AS id) lowers the amount of calcium loss from bone. It is used to treat Paget's disease and osteoporosis in women. °This medicine may be used for other purposes; ask your health care provider or pharmacist if you have questions. °COMMON BRAND NAME(S): Reclast, Zometa °What should I tell my health care provider before I take this medicine? °They need to know if you have any of these conditions: °-aspirin-sensitive asthma °-cancer, especially if you are receiving medicines used to treat cancer °-dental disease or wear dentures °-infection °-kidney disease °-low levels of calcium in the blood °-past surgery on the parathyroid gland or intestines °-receiving corticosteroids like dexamethasone or prednisone °-an unusual or allergic reaction to zoledronic acid, other medicines, foods, dyes, or preservatives °-pregnant or trying to get pregnant °-breast-feeding °How should I use this medicine? °This medicine is for infusion into a vein. It is given by a health care professional in a hospital or clinic setting. °Talk to your pediatrician regarding the use of this medicine in children. This medicine is not approved for use in children. °Overdosage: If you think you have taken too much of this medicine contact a poison control center or emergency room at once. °NOTE: This medicine is only for you. Do not share this medicine with others. °What if I miss a dose? °It is important not to miss your dose. Call your doctor or health care professional if you are unable to keep an appointment. °What may interact with this medicine? °-certain antibiotics given by injection °-NSAIDs, medicines for pain and inflammation, like ibuprofen or naproxen °-some diuretics like bumetanide,  furosemide °-teriparatide °This list may not describe all possible interactions. Give your health care provider a list of all the medicines, herbs, non-prescription drugs, or dietary supplements you use. Also tell them if you smoke, drink alcohol, or use illegal drugs. Some items may interact with your medicine. °What should I watch for while using this medicine? °Visit your doctor or health care professional for regular checkups. It may be some time before you see the benefit from this medicine. Do not stop taking your medicine unless your doctor tells you to. Your doctor may order blood tests or other tests to see how you are doing. °Women should inform their doctor if they wish to become pregnant or think they might be pregnant. There is a potential for serious side effects to an unborn child. Talk to your health care professional or pharmacist for more information. °You should make sure that you get enough calcium and vitamin D while you are taking this medicine. Discuss the foods you eat and the vitamins you take with your health care professional. °Some people who take this medicine have severe bone, joint, and/or muscle pain. This medicine may also increase your risk for jaw problems or a broken thigh bone. Tell your doctor right away if you have severe pain in your jaw, bones, joints, or muscles. Tell your doctor if you have any pain that does not go away or that gets worse. °Tell your dentist and dental surgeon that you are taking this medicine. You should not have major dental surgery while on this medicine. See your dentist to have a dental exam and fix any dental problems before starting this medicine. Take good care of your teeth while on   this medicine. Make sure you see your dentist for regular follow-up appointments. °What side effects may I notice from receiving this medicine? °Side effects that you should report to your doctor or health care professional as soon as possible: °-allergic reactions  like skin rash, itching or hives, swelling of the face, lips, or tongue °-anxiety, confusion, or depression °-breathing problems °-changes in vision °-eye pain °-feeling faint or lightheaded, falls °-jaw pain, especially after dental work °-mouth sores °-muscle cramps, stiffness, or weakness °-trouble passing urine or change in the amount of urine °Side effects that usually do not require medical attention (report to your doctor or health care professional if they continue or are bothersome): °-bone, joint, or muscle pain °-constipation °-diarrhea °-fever °-hair loss °-irritation at site where injected °-loss of appetite °-nausea, vomiting °-stomach upset °-trouble sleeping °-trouble swallowing °-weak or tired °This list may not describe all possible side effects. Call your doctor for medical advice about side effects. You may report side effects to FDA at 1-800-FDA-1088. °Where should I keep my medicine? °This drug is given in a hospital or clinic and will not be stored at home. °NOTE: This sheet is a summary. It may not cover all possible information. If you have questions about this medicine, talk to your doctor, pharmacist, or health care provider. °© 2015, [employer]. (2012-07-01 10:03:48) ° °

## 2014-11-03 ENCOUNTER — Encounter: Payer: Self-pay | Admitting: Cardiology

## 2014-11-03 ENCOUNTER — Ambulatory Visit (INDEPENDENT_AMBULATORY_CARE_PROVIDER_SITE_OTHER): Payer: PPO | Admitting: Cardiology

## 2014-11-03 VITALS — BP 128/82 | HR 73 | Ht 66.0 in | Wt 159.0 lb

## 2014-11-03 DIAGNOSIS — I10 Essential (primary) hypertension: Secondary | ICD-10-CM | POA: Diagnosis not present

## 2014-11-03 MED ORDER — ATORVASTATIN CALCIUM 80 MG PO TABS: 80.0000 mg | ORAL_TABLET | Freq: Every day | ORAL | Status: DC

## 2014-11-03 MED ORDER — LISINOPRIL 5 MG PO TABS: 5.0000 mg | ORAL_TABLET | Freq: Every morning | ORAL | Status: DC

## 2014-11-03 MED ORDER — METOPROLOL TARTRATE 25 MG PO TABS: 25.0000 mg | ORAL_TABLET | Freq: Two times a day (BID) | ORAL | Status: DC

## 2014-11-03 NOTE — Progress Notes (Signed)
HPI The patient presents for evaluation of CAD.  She had PCI of an LAD ruptured plaque in 2014. At that time she presented with jaw pain.  At a follow up visit she had some atypical pain. However, exercise treadmill testing was fine. Since I last saw her she has done well.  The patient denies any new symptoms such as chest discomfort, neck or arm discomfort. There has been no new shortness of breath, PND or orthopnea. There have been no reported palpitations, presyncope or syncope.  She exercises routinely.  Allergies  Allergen Reactions  . Estrogens     Breast pain  . Pneumococcal Vaccines     rash  . Bee Venom Rash  . Sulfa Antibiotics Rash    Current Outpatient Prescriptions  Medication Sig Dispense Refill  . ALPRAZolam (XANAX) 0.25 MG tablet Take 0.25 mg by mouth at bedtime as needed for anxiety.    [name] aspirin 81 MG tablet Take 81 mg by mouth daily.    [name] atorvastatin (LIPITOR) 80 MG tablet Take 1 tablet (80 mg total) by mouth daily. 90 tablet 3  . cholecalciferol (VITAMIN D) 1000 UNITS tablet Take 1,000 Units by mouth daily.     . clopidogrel (PLAVIX) 75 MG tablet Take 1 tablet (75 mg total) by mouth daily with breakfast. 90 tablet 1  . Cranberry (ELLURA PO) Take by mouth daily.    [name] lisinopril (PRINIVIL,ZESTRIL) 5 MG tablet Take 1 tablet (5 mg total) by mouth every morning. 90 tablet 1  . metoprolol tartrate (LOPRESSOR) 25 MG tablet Take 1 tablet (25 mg total) by mouth 2 (two) times daily. 180 tablet 1  . Multiple Vitamins-Minerals (ICAPS) CAPS Take 1 capsule by mouth every morning.    . nitroGLYCERIN (NITROSTAT) 0.4 MG SL tablet Place 1 tablet (0.4 mg total) under the tongue every 5 (five) minutes x 3 doses as needed for chest pain. 25 tablet 3  . Omega-3 Fatty Acids (FISH OIL) 1000 MG CAPS Take 1 capsule by mouth daily.    . zoledronic acid (RECLAST) 5 MG/100ML SOLN injection Inject 5 mg into the vein once.     No current facility-administered medications for this visit.     Past Medical History  Diagnosis Date  . Hypertension   . Self-catheterizes urinary bladder   . UTI (lower urinary tract infection)   . Macular degeneration   . CAD in native artery     a. NSTEMI 2/14 => LHC 03/08/12: Mid LAD 95% (hazy-suggestive of a ruptured plaque), EF 40%, distal anterior, apical and distal inferior HK =>  PCI: Xience DES to the mid LAD.  [name] Cardiomyopathy ([place])     LVEF 45% on cath/echo 03/2012, suspected to be ischemic vs stress-induced  . Mild mitral regurgitation     a. Echo 03/09/12: Mild focal basal hypertrophy the septum, EF 45-50%, distal anteroseptal, anterolateral, inferolateral, inferoseptal and apical HK, grade 2 diastolic dysfunction, mild MR, PASP 52  . Hyperlipidemia   . Anxiety   . Bilateral bunions   . Osteoporosis   . Macular degeneration   . Depression   . Diverticulosis   . Fracture 2013    Right foot fracture- did not require surgery    Past Surgical History  Procedure Laterality Date  . Abdominal hysterectomy  1980  . Back surgery  1986  . Coronary angioplasty with stent placement  03/11/12    95% mid LAD stenosis s/p DES, minimal nonobs dz elsewhere; LVEF 40%, moderate hypokinesis of distal anterior, apical  and distal inferior walls, mildly elevated filling pressures, mild pulmonary HTN  . Reclast  2013  . Carpal tunnel release Right MAy 2015  . Left heart catheterization with coronary angiogram N/A 03/11/2012    Procedure: LEFT HEART CATHETERIZATION WITH CORONARY ANGIOGRAM;  Surgeon: Wellington Hampshire, MD;  Location: [place] [facility];  Service: Cardiovascular;  Laterality: N/A;    ROS:  As stated in the HPI and negative for all other systems.  PHYSICAL EXAM BP 128/82 mmHg  Pulse 73  Ht 5\' 6"  (1.676 m)  Wt 159 lb (72.122 kg)  BMI 25.68 kg/m2 GENERAL:  Well appearing NECK:  No jugular venous distention, waveform within normal limits, carotid upstroke brisk and symmetric, no bruits, no thyromegaly LYMPHATICS:  No cervical, inguinal  adenopathy LUNGS:  Clear to auscultation bilaterally CHEST:  Unremarkable HEART:  PMI not displaced or sustained,S1 and S2 within normal limits, no S3, no S4, no clicks, no rubs, 2/6 apical systolic murmur, no radiation. ABD:  Flat, positive bowel sounds normal in frequency in pitch, no bruits, no rebound, no guarding, no midline pulsatile mass, no hepatomegaly, no splenomegaly EXT:  2 plus pulses throughout, no edema, no cyanosis no clubbing  EKG:  Sinus rhythm, rate 73, axis within normal limits, intervals within normal limits, no acute ST-T wave changes.  11/03/2014   ASSESSMENT AND PLAN  CAD:  She's had no new symptoms. She will continue his secondary risk reduction.  She can stop her Plavix  HTN:  The blood pressure is at target. No change in medications is indicated. We will continue with therapeutic lifestyle changes (TLC).  HYPERLIPIDEMIA: I have asked her to get a repeat lipid profile at [facility], MD's office.  For now she will continue the meds as listed.

## 2014-11-03 NOTE — Patient Instructions (Signed)
Your physician wants you to follow-up in: 1 Year. You will receive a reminder letter in the mail two months in advance. If you don't receive a letter, please call our office to schedule the follow-up appointment.  Your physician has recommended you make the following change in your medication: STOP Plavix

## 2014-12-15 HISTORY — PX: CATARACT EXTRACTION W/PHACO: SHX586

## 2015-02-05 DIAGNOSIS — H353222 Exudative age-related macular degeneration, left eye, with inactive choroidal neovascularization: Secondary | ICD-10-CM | POA: Diagnosis not present

## 2015-02-05 DIAGNOSIS — H353111 Nonexudative age-related macular degeneration, right eye, early dry stage: Secondary | ICD-10-CM | POA: Diagnosis not present

## 2015-02-12 DIAGNOSIS — D485 Neoplasm of uncertain behavior of skin: Secondary | ICD-10-CM | POA: Diagnosis not present

## 2015-02-12 DIAGNOSIS — D1801 Hemangioma of skin and subcutaneous tissue: Secondary | ICD-10-CM | POA: Diagnosis not present

## 2015-02-12 DIAGNOSIS — L821 Other seborrheic keratosis: Secondary | ICD-10-CM | POA: Diagnosis not present

## 2015-02-12 DIAGNOSIS — L570 Actinic keratosis: Secondary | ICD-10-CM | POA: Diagnosis not present

## 2015-02-12 DIAGNOSIS — L814 Other melanin hyperpigmentation: Secondary | ICD-10-CM | POA: Diagnosis not present

## 2015-02-14 HISTORY — PX: CATARACT EXTRACTION W/PHACO: SHX586

## 2015-02-18 DIAGNOSIS — R339 Retention of urine, unspecified: Secondary | ICD-10-CM | POA: Diagnosis not present

## 2015-03-16 DIAGNOSIS — J069 Acute upper respiratory infection, unspecified: Secondary | ICD-10-CM | POA: Diagnosis not present

## 2015-03-18 DIAGNOSIS — R339 Retention of urine, unspecified: Secondary | ICD-10-CM | POA: Diagnosis not present

## 2015-03-22 DIAGNOSIS — J069 Acute upper respiratory infection, unspecified: Secondary | ICD-10-CM | POA: Diagnosis not present

## 2015-04-13 ENCOUNTER — Other Ambulatory Visit: Payer: Self-pay | Admitting: Cardiology

## 2015-04-13 NOTE — Telephone Encounter (Signed)
REFILL 

## 2015-04-15 DIAGNOSIS — R339 Retention of urine, unspecified: Secondary | ICD-10-CM | POA: Diagnosis not present

## 2015-05-20 DIAGNOSIS — R339 Retention of urine, unspecified: Secondary | ICD-10-CM | POA: Diagnosis not present

## 2015-05-28 DIAGNOSIS — L821 Other seborrheic keratosis: Secondary | ICD-10-CM | POA: Diagnosis not present

## 2015-05-28 DIAGNOSIS — L905 Scar conditions and fibrosis of skin: Secondary | ICD-10-CM | POA: Diagnosis not present

## 2015-05-28 DIAGNOSIS — D692 Other nonthrombocytopenic purpura: Secondary | ICD-10-CM | POA: Diagnosis not present

## 2015-05-28 DIAGNOSIS — L570 Actinic keratosis: Secondary | ICD-10-CM | POA: Diagnosis not present

## 2015-06-25 DIAGNOSIS — R339 Retention of urine, unspecified: Secondary | ICD-10-CM | POA: Diagnosis not present

## 2015-07-06 DIAGNOSIS — R339 Retention of urine, unspecified: Secondary | ICD-10-CM | POA: Diagnosis not present

## 2015-07-07 ENCOUNTER — Observation Stay (HOSPITAL_COMMUNITY)
Admission: EM | Admit: 2015-07-07 | Discharge: 2015-07-08 | Disposition: A | Discharge status: Home / Self Care | Payer: PPO | Attending: Family Medicine | Admitting: Family Medicine

## 2015-07-07 ENCOUNTER — Encounter (HOSPITAL_COMMUNITY): Payer: Self-pay | Admitting: *Deleted

## 2015-07-07 ENCOUNTER — Emergency Department (HOSPITAL_COMMUNITY): Payer: PPO

## 2015-07-07 ENCOUNTER — Telehealth: Payer: Self-pay | Admitting: Nurse Practitioner

## 2015-07-07 DIAGNOSIS — I429 Cardiomyopathy, unspecified: Secondary | ICD-10-CM | POA: Diagnosis not present

## 2015-07-07 DIAGNOSIS — R9431 Abnormal electrocardiogram [ECG] [EKG]: Secondary | ICD-10-CM | POA: Diagnosis not present

## 2015-07-07 DIAGNOSIS — I340 Nonrheumatic mitral (valve) insufficiency: Secondary | ICD-10-CM | POA: Diagnosis not present

## 2015-07-07 DIAGNOSIS — R209 Unspecified disturbances of skin sensation: Secondary | ICD-10-CM

## 2015-07-07 DIAGNOSIS — E785 Hyperlipidemia, unspecified: Secondary | ICD-10-CM

## 2015-07-07 DIAGNOSIS — Z91030 Bee allergy status: Secondary | ICD-10-CM | POA: Diagnosis not present

## 2015-07-07 DIAGNOSIS — Z888 Allergy status to other drugs, medicaments and biological substances status: Secondary | ICD-10-CM | POA: Diagnosis not present

## 2015-07-07 DIAGNOSIS — R0789 Other chest pain: Principal | ICD-10-CM

## 2015-07-07 DIAGNOSIS — F419 Anxiety disorder, unspecified: Secondary | ICD-10-CM | POA: Diagnosis not present

## 2015-07-07 DIAGNOSIS — I252 Old myocardial infarction: Secondary | ICD-10-CM

## 2015-07-07 DIAGNOSIS — M79602 Pain in left arm: Secondary | ICD-10-CM

## 2015-07-07 DIAGNOSIS — Z882 Allergy status to sulfonamides status: Secondary | ICD-10-CM | POA: Insufficient documentation

## 2015-07-07 DIAGNOSIS — Z8249 Family history of ischemic heart disease and other diseases of the circulatory system: Secondary | ICD-10-CM | POA: Insufficient documentation

## 2015-07-07 DIAGNOSIS — I110 Hypertensive heart disease with heart failure: Secondary | ICD-10-CM | POA: Insufficient documentation

## 2015-07-07 DIAGNOSIS — Z887 Allergy status to serum and vaccine status: Secondary | ICD-10-CM | POA: Insufficient documentation

## 2015-07-07 DIAGNOSIS — Z7982 Long term (current) use of aspirin: Secondary | ICD-10-CM | POA: Insufficient documentation

## 2015-07-07 DIAGNOSIS — I083 Combined rheumatic disorders of mitral, aortic and tricuspid valves: Secondary | ICD-10-CM | POA: Insufficient documentation

## 2015-07-07 DIAGNOSIS — I2510 Atherosclerotic heart disease of native coronary artery without angina pectoris: Secondary | ICD-10-CM | POA: Diagnosis not present

## 2015-07-07 DIAGNOSIS — Z955 Presence of coronary angioplasty implant and graft: Secondary | ICD-10-CM | POA: Diagnosis not present

## 2015-07-07 DIAGNOSIS — F329 Major depressive disorder, single episode, unspecified: Secondary | ICD-10-CM | POA: Diagnosis not present

## 2015-07-07 DIAGNOSIS — R339 Retention of urine, unspecified: Secondary | ICD-10-CM

## 2015-07-07 DIAGNOSIS — I5189 Other ill-defined heart diseases: Secondary | ICD-10-CM

## 2015-07-07 DIAGNOSIS — E871 Hypo-osmolality and hyponatremia: Secondary | ICD-10-CM | POA: Diagnosis not present

## 2015-07-07 DIAGNOSIS — M810 Age-related osteoporosis without current pathological fracture: Secondary | ICD-10-CM | POA: Diagnosis not present

## 2015-07-07 DIAGNOSIS — H3530 Unspecified macular degeneration: Secondary | ICD-10-CM | POA: Insufficient documentation

## 2015-07-07 DIAGNOSIS — I214 Non-ST elevation (NSTEMI) myocardial infarction: Secondary | ICD-10-CM

## 2015-07-07 DIAGNOSIS — I272 Pulmonary hypertension, unspecified: Secondary | ICD-10-CM

## 2015-07-07 DIAGNOSIS — I509 Heart failure, unspecified: Secondary | ICD-10-CM | POA: Diagnosis not present

## 2015-07-07 DIAGNOSIS — R079 Chest pain, unspecified: Secondary | ICD-10-CM | POA: Diagnosis not present

## 2015-07-07 DIAGNOSIS — I10 Essential (primary) hypertension: Secondary | ICD-10-CM

## 2015-07-07 LAB — BASIC METABOLIC PANEL
Anion gap: 7 (ref 5–15)
BUN: 26 mg/dL — AB (ref 6–20)
CO2: 25 mmol/L (ref 22–32)
Calcium: 9.9 mg/dL (ref 8.9–10.3)
Chloride: 102 mmol/L (ref 101–111)
Creatinine, Ser: 1.02 mg/dL — ABNORMAL HIGH (ref 0.44–1.00)
GFR calc Af Amer: 59 mL/min — ABNORMAL LOW (ref >60)
GFR, EST NON AFRICAN AMERICAN: 51 mL/min — AB (ref >60)
Glucose, Bld: 122 mg/dL — ABNORMAL HIGH (ref 65–99)
Potassium: 4 mmol/L (ref 3.5–5.1)
Sodium: 134 mmol/L — ABNORMAL LOW (ref 135–145)

## 2015-07-07 LAB — CBC
HEMATOCRIT: 40.8 % (ref 36.0–46.0)
Hemoglobin: 13.5 g/dL (ref 12.0–15.0)
MCH: 30.8 pg (ref 26.0–34.0)
MCHC: 33.1 g/dL (ref 30.0–36.0)
MCV: 93.2 fL (ref 78.0–100.0)
PLATELETS: 188 10*3/uL (ref 150–400)
RBC: 4.38 MIL/uL (ref 3.87–5.11)
RDW: 13 % (ref 11.5–15.5)
WBC: 8.7 10*3/uL (ref 4.0–10.5)

## 2015-07-07 LAB — TROPONIN I: Troponin I: <0.03 ng/mL (ref <0.031)

## 2015-07-07 MED ORDER — NITROGLYCERIN 0.4 MG SL SUBL
0.4000 mg | SUBLINGUAL_TABLET | SUBLINGUAL | Status: DC | PRN
  Administered 2015-07-07 – 2015-07-08 (×3): 0.4 mg via SUBLINGUAL
  Filled 2015-07-07: qty 1

## 2015-07-07 MED ORDER — ASPIRIN 81 MG PO CHEW
81.0000 mg | CHEWABLE_TABLET | Freq: Once | ORAL | Status: AC
  Administered 2015-07-07: 81 mg via ORAL
  Filled 2015-07-07: qty 1

## 2015-07-07 NOTE — ED Provider Notes (Signed)
CSN: [id number]     Arrival date & time 07/07/15  2021 History   First MD Initiated Contact with Patient 07/07/15 2251     Chief Complaint  Patient presents with  . Chest Pain     (Consider location/radiation/quality/duration/timing/severity/associated sxs/prior Treatment) HPI Comments: Patient with a history of HTN, CAD (stents in 2014), cardiomyopathy, HLD, presents with an aching pain in her left arm that started 2 hours prior to arrival and has been constant since onset. No chest pain, nausea, vomiting, SOB or diaphoresis. She reports previous MI where her only symptom was an aching jaw pain. She has NTG at home but did not take any as she was instructed to take it only when she had pain in her chest. No injury to left arm. No redness or swelling. Nothing makes the pain better or worse. No tingling, numbness, weakness of the extremity or neck pain.  The history is provided by the patient and a relative. No language interpreter was used.    Past Medical History  Diagnosis Date  . Hypertension   . Self-catheterizes urinary bladder   . UTI (lower urinary tract infection)   . Macular degeneration   . CAD in native artery     a. NSTEMI 2/14 => LHC 03/08/12: Mid LAD 95% (hazy-suggestive of a ruptured plaque), EF 40%, distal anterior, apical and distal inferior HK =>  PCI: Xience DES to the mid LAD.  [name] Cardiomyopathy ([place])     LVEF 45% on cath/echo 03/2012, suspected to be ischemic vs stress-induced  . Mild mitral regurgitation     a. Echo 03/09/12: Mild focal basal hypertrophy the septum, EF 45-50%, distal anteroseptal, anterolateral, inferolateral, inferoseptal and apical HK, grade 2 diastolic dysfunction, mild MR, PASP 52  . Hyperlipidemia   . Anxiety   . Bilateral bunions   . Osteoporosis   . Macular degeneration   . Depression   . Diverticulosis   . Fracture 2013    Right foot fracture- did not require surgery   Past Surgical History  Procedure Laterality Date  . Abdominal  hysterectomy  1980  . Back surgery  1986  . Coronary angioplasty with stent placement  03/11/12    95% mid LAD stenosis s/p DES, minimal nonobs dz elsewhere; LVEF 40%, moderate hypokinesis of distal anterior, apical and distal inferior walls, mildly elevated filling pressures, mild pulmonary HTN  . Reclast  2013  . Carpal tunnel release Right MAy 2015  . Left heart catheterization with coronary angiogram N/A 03/11/2012    Procedure: LEFT HEART CATHETERIZATION WITH CORONARY ANGIOGRAM;  Surgeon: Wellington Hampshire, MD;  Location: [facility];  Service: Cardiovascular;  Laterality: N/A;   Family History  Problem Relation Age of Onset  . Colon cancer Father   . Hypertension Mother   . CAD Mother   . Heart attack Mother   . CAD Brother 49    CABG  . Cancer - Lung Brother   . Cancer - Lung Sister   . Hypertension Paternal Grandmother   . Breast cancer Paternal Grandfather    Social History  Substance Use Topics  . Smoking status: Never Smoker   . Smokeless tobacco: Never Used  . Alcohol Use: Yes     Comment: 4 glasses of wine per week   OB History    No data available     Review of Systems  Constitutional: Negative for fever, chills and diaphoresis.  HENT: Negative.   Respiratory: Negative.   Cardiovascular: Negative for chest pain  and leg swelling.  Gastrointestinal: Negative.   Musculoskeletal:       See HPI.  Skin: Negative.   Neurological: Negative.  Negative for weakness and numbness.      Allergies  Estrogens; Pneumococcal vaccines; Bee venom; and Sulfa antibiotics  Home Medications   Prior to Admission medications   Medication Sig Start Date End Date Taking? Authorizing Provider  ALPRAZolam Duanne Moron) 0.25 MG tablet Take 0.25 mg by mouth at bedtime as needed for anxiety.   Yes Historical Provider, MD  aspirin 81 MG tablet Take 81 mg by mouth daily.   Yes Historical Provider, MD  atorvastatin (LIPITOR) 80 MG tablet Take 1 tablet (80 mg total) by mouth daily. 11/03/14   Yes Minus Breeding, MD  cholecalciferol (VITAMIN D) 1000 UNITS tablet Take 1,000 Units by mouth daily.    Yes Historical Provider, MD  Cranberry (ELLURA PO) Take 1 tablet by mouth daily.    Yes Historical Provider, MD  lisinopril (PRINIVIL,ZESTRIL) 5 MG tablet Take 1 tablet (5 mg total) by mouth every morning. 11/03/14  Yes Minus Breeding, MD  metoprolol tartrate (LOPRESSOR) 25 MG tablet Take 1 tablet (25 mg total) by mouth 2 (two) times daily. 11/03/14  Yes Minus Breeding, MD  Multiple Vitamins-Minerals (ICAPS) CAPS Take 1 capsule by mouth every morning.   Yes Historical Provider, MD  nitroGLYCERIN (NITROSTAT) 0.4 MG SL tablet Place 1 tablet (0.4 mg total) under the tongue every 5 (five) minutes x 3 doses as needed for chest pain. 07/14/14  Yes Minus Breeding, MD  Omega-3 Fatty Acids (FISH OIL) 1000 MG CAPS Take 1 capsule by mouth daily.   Yes Historical Provider, MD  zoledronic acid (RECLAST) 5 MG/100ML SOLN injection Inject 5 mg into the vein once. Next dose due in July 2017   Yes Historical Provider, MD  clopidogrel (PLAVIX) 75 MG tablet TAKE 1 TABLET (75 MG TOTAL) BY MOUTH DAILY WITH BREAKFAST. Patient not taking: Reported on 07/07/2015 04/13/15   Minus Breeding, MD   BP 115/78 mmHg  Pulse 70  Temp(Src) 97.8 F (36.6 C) (Oral)  Resp 18  SpO2 93% Physical Exam  Constitutional: She is oriented to person, place, and time. She appears well-developed and well-nourished.  HENT:  Head: Normocephalic.  Neck: Normal range of motion. Neck supple.  Cardiovascular: Normal rate and regular rhythm.   Pulmonary/Chest: Effort normal and breath sounds normal.  Abdominal: Soft. Bowel sounds are normal. There is no tenderness. There is no rebound and no guarding.  Musculoskeletal: Normal range of motion.  Left upper extremity unremarkable in appearance. Non-tender, no swelling or redness. FROM. Distal pulses palpable.   Neurological: She is alert and oriented to person, place, and time.  Skin: Skin is warm  and dry. No rash noted.  Psychiatric: She has a normal mood and affect.    ED Course  Procedures (including critical care time) Labs Review Labs Reviewed  BASIC METABOLIC PANEL - Abnormal; Notable for the following:    Sodium 134 (*)    Glucose, Bld 122 (*)    BUN 26 (*)    Creatinine, Ser 1.02 (*)    GFR calc non Af Amer 51 (*)    GFR calc Af Amer 59 (*)    All other components within normal limits  CBC  TROPONIN I  TROPONIN I   Results for orders placed or performed during the hospital encounter of [id number]  Basic metabolic panel  Result Value Ref Range   Sodium 134 (L) 135 - 145 mmol/L  Potassium 4.0 3.5 - 5.1 mmol/L   Chloride 102 101 - 111 mmol/L   CO2 25 22 - 32 mmol/L   Glucose, Bld 122 (H) 65 - 99 mg/dL   BUN 26 (H) 6 - 20 mg/dL   Creatinine, Ser 1.02 (H) 0.44 - 1.00 mg/dL   Calcium 9.9 8.9 - 10.3 mg/dL   GFR calc non Af Amer 51 (L) >60 mL/min   GFR calc Af Amer 59 (L) >60 mL/min   Anion gap 7 5 - 15  CBC  Result Value Ref Range   WBC 8.7 4.0 - 10.5 K/uL   RBC 4.38 3.87 - 5.11 MIL/uL   Hemoglobin 13.5 12.0 - 15.0 g/dL   HCT 40.8 36.0 - 46.0 %   MCV 93.2 78.0 - 100.0 fL   MCH 30.8 26.0 - 34.0 pg   MCHC 33.1 30.0 - 36.0 g/dL   RDW 13.0 11.5 - 15.5 %   Platelets 188 150 - 400 K/uL  Troponin I  Result Value Ref Range   Troponin I <0.03 <0.031 ng/mL  Troponin I  Result Value Ref Range   Troponin I <0.03 <0.031 ng/mL   Dg Chest 2 View  07/07/2015  CLINICAL DATA:  Chest and left arm pain beginning at 6:30 p.m. today. EXAM: CHEST  2 VIEW COMPARISON:  Single-view of the chest 04/19/2013 and 03/08/2012. FINDINGS: The lungs are clear. Heart size is normal. Tortuous aorta is again seen. No pneumothorax or pleural effusion. IMPRESSION: No acute disease.  Stable compared to prior exam. Electronically Signed   By: Inge Rise M.D.   On: 07/07/2015 21:07     Imaging Review Dg Chest 2 View  07/07/2015  CLINICAL DATA:  Chest and left arm pain beginning at 6:30  p.m. today. EXAM: CHEST  2 VIEW COMPARISON:  Single-view of the chest 04/19/2013 and 03/08/2012. FINDINGS: The lungs are clear. Heart size is normal. Tortuous aorta is again seen. No pneumothorax or pleural effusion. IMPRESSION: No acute disease.  Stable compared to prior exam. Electronically Signed   By: Inge Rise M.D.   On: 07/07/2015 21:07   I have personally reviewed and evaluated these images and lab results as part of my medical decision-making.   EKG Interpretation   Date/Time:  Wednesday July 07 2015 [id number] EDT Ventricular Rate:  75 PR Interval:  156 QRS Duration: 68 QT Interval:  394 QTC Calculation: 439 R Axis:   8 Text Interpretation:  Normal sinus rhythm Possible Left atrial enlargement  Left ventricular hypertrophy Cannot rule out Septal infarct , age  undetermined Abnormal ECG When compared with ECG of 03/12/2012, T wave  inversion in the Inferior leads and Anterolateral leads is no longer  Present Confirmed by [facility]  MD, DAVID ([id number]) on 07/07/2015 10:53:37 PM      MDM   Final diagnoses:  None    1. UE pain 2. History of CAD with atypical symptoms  Patient reports pain limited to left UE onset this evening. No SOB, chest pain or diaphoresis. She reports history of MI without chest pain, SOB or diaphoresis - had only aching jaw pain at that time. She has a non-acute EKG, normal troponin x 2. Her pain is resolved with nitroglycerin.   She is evaluated by Dr. Roxanne Mins who feels she will need to be admitted for serial troponins. Hospitalist paged for admission.     Charlann Lange, PA-C [id number] [id number]  Delora Fuel, MD [id number]

## 2015-07-07 NOTE — Telephone Encounter (Signed)
   Pt called stating that she has been having persistent left arm discomfort and burning w/o associated symptoms for the past 40 minutes.  Discomfort is no worse with movement or palpation and persist despite taking ibuprofen.  Ss are different from prior anginal equivalent at time of MI in 2014, which was more typical c/p.  I advised that in the absence of an ECG or biomarkers, it is difficult to ascertain if Ss are cardiac in origin or not, but that if she is uncomfortable and has ongoing Ss, she may be best served by presenting to the ED for evaluation.  Caller verbalized understanding and was grateful for the call back.  Murray Hodgkins, NP 07/07/2015, 7:32 PM

## 2015-07-07 NOTE — ED Notes (Signed)
The pt is c/o chest and lt arm pain for 1-2 hours hx mi alert no distress

## 2015-07-08 ENCOUNTER — Observation Stay (HOSPITAL_BASED_OUTPATIENT_CLINIC_OR_DEPARTMENT_OTHER): Payer: PPO

## 2015-07-08 ENCOUNTER — Telehealth: Payer: Self-pay | Admitting: Cardiology

## 2015-07-08 ENCOUNTER — Observation Stay (HOSPITAL_COMMUNITY): Payer: PPO

## 2015-07-08 DIAGNOSIS — I5189 Other ill-defined heart diseases: Secondary | ICD-10-CM

## 2015-07-08 DIAGNOSIS — I252 Old myocardial infarction: Secondary | ICD-10-CM

## 2015-07-08 DIAGNOSIS — I10 Essential (primary) hypertension: Secondary | ICD-10-CM

## 2015-07-08 DIAGNOSIS — M79602 Pain in left arm: Secondary | ICD-10-CM

## 2015-07-08 DIAGNOSIS — F419 Anxiety disorder, unspecified: Secondary | ICD-10-CM | POA: Diagnosis not present

## 2015-07-08 DIAGNOSIS — R0789 Other chest pain: Principal | ICD-10-CM

## 2015-07-08 DIAGNOSIS — I2510 Atherosclerotic heart disease of native coronary artery without angina pectoris: Secondary | ICD-10-CM

## 2015-07-08 DIAGNOSIS — R079 Chest pain, unspecified: Secondary | ICD-10-CM | POA: Diagnosis not present

## 2015-07-08 LAB — CBC WITH DIFFERENTIAL/PLATELET
BASOS ABS: 0.1 10*3/uL (ref 0.0–0.1)
Basophils Relative: 1 %
Eosinophils Absolute: 0.2 10*3/uL (ref 0.0–0.7)
Eosinophils Relative: 2 %
HEMATOCRIT: 39.6 % (ref 36.0–46.0)
Hemoglobin: 13 g/dL (ref 12.0–15.0)
LYMPHS PCT: 39 %
Lymphs Abs: 2.6 10*3/uL (ref 0.7–4.0)
MCH: 30.3 pg (ref 26.0–34.0)
MCHC: 32.8 g/dL (ref 30.0–36.0)
MCV: 92.3 fL (ref 78.0–100.0)
MONO ABS: 0.7 10*3/uL (ref 0.1–1.0)
Monocytes Relative: 11 %
NEUTROS ABS: 3.2 10*3/uL (ref 1.7–7.7)
Neutrophils Relative %: 47 %
Platelets: 165 10*3/uL (ref 150–400)
RBC: 4.29 MIL/uL (ref 3.87–5.11)
RDW: 13.2 % (ref 11.5–15.5)
WBC: 6.7 10*3/uL (ref 4.0–10.5)

## 2015-07-08 LAB — ECHOCARDIOGRAM COMPLETE
EERAT: 7.9
EWDT: 345 ms
FS: 35 % (ref 28–44)
IVS/LV PW RATIO, ED: 1.08
LA diam end sys: 40 mm
LA diam index: 2.21 cm/m2
LA vol A4C: 36.6 ml
LA vol index: 19.5 mL/m2
LA vol: 35.3 mL
LASIZE: 40 mm
LDCA: 3.46 cm2
LV E/e' medial: 7.9
LV TDI E'LATERAL: 5.77
LV e' LATERAL: 5.77 cm/s
LVEEAVG: 7.9
LVOTD: 21 mm
MV Dec: 345
MV pk A vel: 97 m/s
MVPKEVEL: 45.6 m/s
PW: 15.9 mm — AB (ref 0.6–1.1)
RV TAPSE: 23.4 mm
TDI e' medial: 3.26

## 2015-07-08 LAB — BASIC METABOLIC PANEL
ANION GAP: 6 (ref 5–15)
BUN: 18 mg/dL (ref 6–20)
CO2: 24 mmol/L (ref 22–32)
Calcium: 9.5 mg/dL (ref 8.9–10.3)
Chloride: 107 mmol/L (ref 101–111)
Creatinine, Ser: 0.86 mg/dL (ref 0.44–1.00)
GFR calc Af Amer: >60 mL/min (ref >60)
GFR calc non Af Amer: >60 mL/min (ref >60)
GLUCOSE: 89 mg/dL (ref 65–99)
POTASSIUM: 3.9 mmol/L (ref 3.5–5.1)
Sodium: 137 mmol/L (ref 135–145)

## 2015-07-08 LAB — TROPONIN I
Troponin I: <0.03 ng/mL
Troponin I: <0.03 ng/mL (ref <0.031)
Troponin I: <0.03 ng/mL (ref <0.031)
Troponin I: <0.03 ng/mL (ref <0.031)

## 2015-07-08 LAB — ECHOCARDIOGRAM LIMITED

## 2015-07-08 MED ORDER — ONDANSETRON HCL 4 MG/2ML IJ SOLN: 4.0000 mg | Freq: Four times a day (QID) | INTRAMUSCULAR | Status: DC | PRN

## 2015-07-08 MED ORDER — ACETAMINOPHEN 325 MG PO TABS
650.0000 mg | ORAL_TABLET | ORAL | Status: DC | PRN
  Administered 2015-07-08 (×2): 650 mg via ORAL
  Filled 2015-07-08 (×2): qty 2

## 2015-07-08 MED ORDER — MORPHINE SULFATE (PF) 2 MG/ML IV SOLN: 2.0000 mg | INTRAVENOUS | Status: DC | PRN

## 2015-07-08 MED ORDER — METOPROLOL TARTRATE 25 MG PO TABS: 25.0000 mg | ORAL_TABLET | Freq: Two times a day (BID) | ORAL | Status: DC

## 2015-07-08 MED ORDER — OCUVITE PO TABS
1.0000 | ORAL_TABLET | Freq: Every morning | ORAL | Status: DC
  Filled 2015-07-08: qty 1

## 2015-07-08 MED ORDER — ATORVASTATIN CALCIUM 80 MG PO TABS: 80.0000 mg | ORAL_TABLET | Freq: Every day | ORAL | Status: DC

## 2015-07-08 MED ORDER — ENOXAPARIN SODIUM 40 MG/0.4ML ~~LOC~~ SOLN
40.0000 mg | Freq: Every day | SUBCUTANEOUS | Status: DC
  Administered 2015-07-08: 40 mg via SUBCUTANEOUS
  Filled 2015-07-08: qty 0.4

## 2015-07-08 MED ORDER — ALPRAZOLAM 0.25 MG PO TABS
0.2500 mg | ORAL_TABLET | Freq: Once | ORAL | Status: AC
  Administered 2015-07-08: 0.25 mg via ORAL
  Filled 2015-07-08: qty 1

## 2015-07-08 MED ORDER — ALPRAZOLAM 0.25 MG PO TABS: 0.2500 mg | ORAL_TABLET | Freq: Every evening | ORAL | Status: DC | PRN

## 2015-07-08 MED ORDER — ASPIRIN 81 MG PO CHEW: 81.0000 mg | CHEWABLE_TABLET | Freq: Every day | ORAL | Status: DC

## 2015-07-08 MED ORDER — SODIUM CHLORIDE 0.9 % IV SOLN
Freq: Once | INTRAVENOUS | Status: AC
  Administered 2015-07-08: 04:00:00 via INTRAVENOUS

## 2015-07-08 MED ORDER — GI COCKTAIL ~~LOC~~: 30.0000 mL | Freq: Four times a day (QID) | ORAL | Status: DC | PRN

## 2015-07-08 MED ORDER — VITAMIN D 1000 UNITS PO TABS: 1000.0000 [IU] | ORAL_TABLET | Freq: Every day | ORAL | Status: DC

## 2015-07-08 MED ORDER — NITROGLYCERIN 0.4 MG SL SUBL: 0.4000 mg | SUBLINGUAL_TABLET | SUBLINGUAL | Status: DC | PRN

## 2015-07-08 MED ORDER — LISINOPRIL 5 MG PO TABS: 5.0000 mg | ORAL_TABLET | Freq: Every morning | ORAL | Status: DC

## 2015-07-08 MED ORDER — OMEGA-3-ACID ETHYL ESTERS 1 G PO CAPS
1.0000 g | ORAL_CAPSULE | Freq: Every day | ORAL | Status: DC
  Filled 2015-07-08: qty 1

## 2015-07-08 NOTE — ED Notes (Signed)
Johnson MD at bedside with pt

## 2015-07-08 NOTE — ED Notes (Signed)
Johnson MD returned page and will come see pt for possible d/c home

## 2015-07-08 NOTE — ED Notes (Signed)
Penni Bombard, daughter - [phone]

## 2015-07-08 NOTE — ED Notes (Signed)
Paged Dr Fuller Plan for RN [facility]

## 2015-07-08 NOTE — Telephone Encounter (Signed)
New Message  Pt daughter states pt is to be discharged from hospital and is needing to follow up with cardiologist.  Discharge note instruction: Please make an appointment to see your cardiologist in the next week to follow up your echocardiogram results  No appt avail. And pt daughter wants appt before vacation the week of 6/20

## 2015-07-08 NOTE — Discharge Summary (Signed)
DISCHARGE SUMMARY  Erin Roth  MR#: [id number]  DOB:Apr 21, 1933  Date of Admission: 07/07/2015 Date of Discharge: 07/08/2015  Attending Physician: Irwin Brakeman  Patient's PCP: Vidal Schwalbe, MD  Consults:  none  Recommendations for Outpatient Follow-up:  1. See your cardiologist in 1 week to review your echocardiograms 2. See your PCP in 2 weeks for hospital follow up  3. Return if symptoms recur, worsen or new problems develop  Discharge Diagnoses: Present on Admission:  . (Resolved) Atypical chest pain . Essential hypertension . CAD (coronary artery disease), native coronary artery . Anxiety   Active Problems:   CAD (coronary artery disease), native coronary artery   History of MI (myocardial infarction)   Dynamic left ventricular outflow obstruction   Anxiety   Essential hypertension  Hospital Course:  [name] (Resolved) Atypical chest pain  - Symptoms completely resolved after nitroglycerin.  Pt was observed on telemetry and ruled out for acute myocardial ischemia by serial troponins.  Pt had an echocardiogram that initially was suspicious for LV outlet obstruction, repeat testing was done that showed some dynamic LV outlet obstruction with valsalva maneuvers.  Pt was instructed to follow up with her cardiologist Dr. Percival Spanish to review results of her echocardiograms.  Pt anxious to be discharged and said that she would be sure to follow up as recommended.   . Essential hypertension -resume home meds  . CAD (coronary artery disease), native coronary artery - Pt has drug eluting stent 2014: Resume home aspirin daily.  . Anxiety - resume home meds  Discharge Exam: Filed Vitals:   07/08/15 1248 07/08/15 1329  BP: 140/86 105/41  Pulse: 73 73  Temp: 97.5 F (36.4 C)   Resp: 16 14   General: awake, alert, no distress, cooperative Cardiovascular: normal s1, s2 sounds, 2/6 sys murmur Respiratory: BBS clear to auscultation Neuro: nonfocal  Discharge  Instructions  Discharge Instructions    Diet - low sodium heart healthy    Complete by:  As directed      Discharge instructions    Complete by:  As directed   Return if symptoms recur, worsen or new problems develop Please make an appointment to see your cardiologist in the next week to follow up your echocardiogram results Please see your primary care physician in 1-2 weeks     Discontinue IV    Complete by:  As directed      Increase activity slowly    Complete by:  As directed             Medication List    STOP taking these medications        clopidogrel 75 MG tablet  Commonly known as:  PLAVIX      TAKE these medications        ALPRAZolam 0.25 MG tablet  Commonly known as:  XANAX  Take 0.25 mg by mouth at bedtime as needed for anxiety.     aspirin 81 MG tablet  Take 81 mg by mouth daily.     atorvastatin 80 MG tablet  Commonly known as:  LIPITOR  Take 1 tablet (80 mg total) by mouth daily.     cholecalciferol 1000 units tablet  Commonly known as:  VITAMIN D  Take 1,000 Units by mouth daily.     ELLURA PO  Take 1 tablet by mouth daily.     Fish Oil 1000 MG Caps  Take 1 capsule by mouth daily.     ICAPS Caps  Take 1 capsule by mouth  every morning.     lisinopril 5 MG tablet  Commonly known as:  PRINIVIL,ZESTRIL  Take 1 tablet (5 mg total) by mouth every morning.     metoprolol tartrate 25 MG tablet  Commonly known as:  LOPRESSOR  Take 1 tablet (25 mg total) by mouth 2 (two) times daily.     nitroGLYCERIN 0.4 MG SL tablet  Commonly known as:  NITROSTAT  Place 1 tablet (0.4 mg total) under the tongue every 5 (five) minutes x 3 doses as needed for chest pain.     RECLAST 5 MG/100ML Soln injection  Generic drug:  zoledronic acid  Inject 5 mg into the vein once. Next dose due in July 2017       Allergies  Allergen Reactions  . Estrogens     Breast pain  . Pneumococcal Vaccines     rash  . Bee Venom Rash  . Sulfa Antibiotics Rash        Follow-up Information    Follow up with Minus Breeding, MD In 1 week.   Specialty:  Cardiology   Why:  Hospital Follow Up    Contact information:   [address] [place] [address] [phone]       Follow up with Vidal Schwalbe, MD In 1 week.   Specialty:  Family Medicine   Why:  Hospital Follow Up    Contact information:   [place] [address] [phone]      Study Conclusions  - Left ventricle: There appears to be some turbulence in the LVOT  and evidence of systolic anterior motion of the anterior mitral  valve leaflet towards the end of systole. Suspect there is some  degree of outflow tract obstruction but this was not assessed by  doppler. The cavity size was normal. There was severe focal basal  and moderate concentric hypertrophy. Systolic function was  normal. The estimated ejection fraction was in the range of 60%  to 65%. Wall motion was normal; there were no regional wall  motion abnormalities. There was an increased relative  contribution of atrial contraction to ventricular filling.  Doppler parameters are consistent with abnormal left ventricular  relaxation (grade 1 diastolic dysfunction). - Aortic valve: Moderately calcified annulus. Trileaflet; mildly  thickened, mildly calcified leaflets. There was mild  regurgitation. - Mitral valve: There was mild regurgitation. - Tricuspid valve: There was mild regurgitation. - Pulmonic valve: There was trivial regurgitation. - Pulmonary arteries: PA peak pressure: 31 mm Hg (S).  Recommendations: Recommend limited echo to assess for dynamic outflow tract obstruction by doppler. Transthoracic echocardiography. M-mode, complete 2D, spectral Doppler, and color Doppler. Birthdate: Patient birthdate: Jan 13, 1934. Age: Patient is 82 yr old. Sex: Gender: female. BMI: 25.7 kg/m^2. Blood pressure: 139/100 Patient status:  Inpatient. Study date: Study date: 07/08/2015. Study time: 08:35 AM. Location: Emergency department.  Limited Echo Study Conclusions  - Left ventricle: Systolic function was normal. The estimated  ejection fraction was in the range of 60% to 65%. There was  dynamic obstruction during Valsalvain the outflow tract, with a  peak velocity of 180 cm/sec and a peak gradient of 13 mm Hg. Wall  motion was normal; there were no regional wall motion  abnormalities. - Aortic valve: There was mild regurgitation.    Results for orders placed or performed during the hospital encounter of 07/07/15 (from the past 24 hour(s))  Basic metabolic panel     Status: Abnormal   Collection Time: 07/07/15  8:35  PM  Result Value Ref Range   Sodium 134 (L) 135 - 145 mmol/L   Potassium 4.0 3.5 - 5.1 mmol/L   Chloride 102 101 - 111 mmol/L   CO2 25 22 - 32 mmol/L   Glucose, Bld 122 (H) 65 - 99 mg/dL   BUN 26 (H) 6 - 20 mg/dL   Creatinine, Ser 1.02 (H) 0.44 - 1.00 mg/dL   Calcium 9.9 8.9 - 10.3 mg/dL   GFR calc non Af Amer 51 (L) >60 mL/min   GFR calc Af Amer 59 (L) >60 mL/min   Anion gap 7 5 - 15  CBC     Status: None   Collection Time: 07/07/15  8:35 PM  Result Value Ref Range   WBC 8.7 4.0 - 10.5 K/uL   RBC 4.38 3.87 - 5.11 MIL/uL   Hemoglobin 13.5 12.0 - 15.0 g/dL   HCT 40.8 36.0 - 46.0 %   MCV 93.2 78.0 - 100.0 fL   MCH 30.8 26.0 - 34.0 pg   MCHC 33.1 30.0 - 36.0 g/dL   RDW 13.0 11.5 - 15.5 %   Platelets 188 150 - 400 K/uL  Troponin I     Status: None   Collection Time: 07/07/15  8:35 PM  Result Value Ref Range   Troponin I <0.03 <0.031 ng/mL  Troponin I     Status: None   Collection Time: 07/07/15 11:50 PM  Result Value Ref Range   Troponin I <0.03 <0.031 ng/mL  Troponin I-serum (0, 3, 6 hours)     Status: None   Collection Time: 07/08/15  2:20 AM  Result Value Ref Range   Troponin I <0.03 <0.031 ng/mL  Troponin I-serum (0, 3, 6 hours)     Status: None   Collection Time:  07/08/15  5:15 AM  Result Value Ref Range   Troponin I <0.03 <0.031 ng/mL  CBC with Differential/Platelet     Status: None   Collection Time: 07/08/15  5:15 AM  Result Value Ref Range   WBC 6.7 4.0 - 10.5 K/uL   RBC 4.29 3.87 - 5.11 MIL/uL   Hemoglobin 13.0 12.0 - 15.0 g/dL   HCT 39.6 36.0 - 46.0 %   MCV 92.3 78.0 - 100.0 fL   MCH 30.3 26.0 - 34.0 pg   MCHC 32.8 30.0 - 36.0 g/dL   RDW 13.2 11.5 - 15.5 %   Platelets 165 150 - 400 K/uL   Neutrophils Relative % 47 %   Neutro Abs 3.2 1.7 - 7.7 K/uL   Lymphocytes Relative 39 %   Lymphs Abs 2.6 0.7 - 4.0 K/uL   Monocytes Relative 11 %   Monocytes Absolute 0.7 0.1 - 1.0 K/uL   Eosinophils Relative 2 %   Eosinophils Absolute 0.2 0.0 - 0.7 K/uL   Basophils Relative 1 %   Basophils Absolute 0.1 0.0 - 0.1 K/uL  Basic metabolic panel     Status: None   Collection Time: 07/08/15  5:15 AM  Result Value Ref Range   Sodium 137 135 - 145 mmol/L   Potassium 3.9 3.5 - 5.1 mmol/L   Chloride 107 101 - 111 mmol/L   CO2 24 22 - 32 mmol/L   Glucose, Bld 89 65 - 99 mg/dL   BUN 18 6 - 20 mg/dL   Creatinine, Ser 0.86 0.44 - 1.00 mg/dL   Calcium 9.5 8.9 - 10.3 mg/dL   GFR calc non Af Amer >60 >60 mL/min   GFR calc Af Amer >60 >60  mL/min   Anion gap 6 5 - 15  Troponin I-serum (0, 3, 6 hours)     Status: None   Collection Time: 07/08/15  8:15 AM  Result Value Ref Range   Troponin I <0.03 <0.031 ng/mL    Disposition: Home  DIET: Heart healthy  Follow-up Appts: Discharge Instructions    Diet - low sodium heart healthy    Complete by:  As directed      Discharge instructions    Complete by:  As directed   Return if symptoms recur, worsen or new problems develop Please make an appointment to see your cardiologist in the next week to follow up your echocardiogram results Please see your primary care physician in 1-2 weeks     Discontinue IV    Complete by:  As directed      Increase activity slowly    Complete by:  As directed            Signed: Clanford Johnson 07/08/2015, 3:52 PM

## 2015-07-08 NOTE — ED Notes (Signed)
Echo called per Wynetta Emery MD

## 2015-07-08 NOTE — ED Notes (Signed)
Pharmacy called to verify and dispense daily meds

## 2015-07-08 NOTE — Progress Notes (Signed)
  Echocardiogram 2D Echocardiogram has been performed.  Erin Roth 07/08/2015, 9:10 AM

## 2015-07-08 NOTE — Progress Notes (Signed)
  Echocardiogram 2D Echocardiogram Limited has been performed.  Darlina Sicilian M 07/08/2015, 12:31 PM

## 2015-07-08 NOTE — Discharge Instructions (Signed)

## 2015-07-08 NOTE — Progress Notes (Signed)
Norman A Kerney to be D/C'd Home per MD order. Discussed with the patient and all questions fully answered.    VVS, Skin clean, dry and intact without evidence of skin break down, no evidence of skin tears noted.  IV catheter discontinued intact. Site without signs and symptoms of complications. Dressing and pressure applied.  An After Visit Summary was printed and given to the patient.  Patient escorted via [place], and D/C home via private auto.  Cyndra Numbers  07/08/2015 4:56 PM

## 2015-07-08 NOTE — ED Notes (Signed)
Paged admitting regarding update for pt.

## 2015-07-08 NOTE — H&P (Signed)
History and Physical    Erin Roth [id number] DOB: April 17, 1933 DOA: 07/07/2015  Referring MD/NP/PA: Charlann Lange, PA-C PCP: Vidal Schwalbe, MD  Patient coming from: Home  Chief Complaint: Left arm pain  HPI: SCHELBY Roth is a 82 y.o. female with medical history significant of HTN, CAD s/p stents in 2014,  CHF EF 55-60% with grade 1 diastolic dysfunction in [id number]; who presents with left upper arm discomfort. Symptoms started last night sometime around 6:30/17. Describes pain as "burning" located in her bicep area that did not radiate. Pain was constant and she rates it a 2 out of 10. She notes she had similar pain when she had a MI in 2014, but at that time it was located in her jaw. She follows with Dr. Percival Spanish of cardiology as an outpatient. She called his office today and described symptoms and it was recommended for her to come into the emergency room for further evaluation. Denies any nausea, vomiting, abdominal pain, weight loss, change in appetite, leg swelling, recent travel, sick contacts, or rash.   ED Course: Upon admission to the emergency department patient was evaluated and seen to be afebrile with vital signs otherwise within normal limits. Lab work revealed sodium of 134, BUN of 26, creatinine of 1.02, and all of her lab work relatively unremarkable. Patient had 2 negative troponins . Patient had been given aspirin and nitroglycerin in the emergency department with improvement of resolution of symptoms. Due to the resolution of symptoms after nitroglycerin it was recommended to admit the patient for observation overnight.  Review of Systems: As per HPI otherwise 10 point review of systems negative.   Past Medical History  Diagnosis Date  . Hypertension   . Self-catheterizes urinary bladder   . UTI (lower urinary tract infection)   . Macular degeneration   . CAD in native artery     a. NSTEMI 2/14 => LHC 03/08/12: Mid LAD 95% (hazy-suggestive of a ruptured plaque), EF  40%, distal anterior, apical and distal inferior HK =>  PCI: Xience DES to the mid LAD.  [name] Cardiomyopathy ([place])     LVEF 45% on cath/echo 03/2012, suspected to be ischemic vs stress-induced  . Mild mitral regurgitation     a. Echo 03/09/12: Mild focal basal hypertrophy the septum, EF 45-50%, distal anteroseptal, anterolateral, inferolateral, inferoseptal and apical HK, grade 2 diastolic dysfunction, mild MR, PASP 52  . Hyperlipidemia   . Anxiety   . Bilateral bunions   . Osteoporosis   . Macular degeneration   . Depression   . Diverticulosis   . Fracture 2013    Right foot fracture- did not require surgery    Past Surgical History  Procedure Laterality Date  . Abdominal hysterectomy  1980  . Back surgery  1986  . Coronary angioplasty with stent placement  03/11/12    95% mid LAD stenosis s/p DES, minimal nonobs dz elsewhere; LVEF 40%, moderate hypokinesis of distal anterior, apical and distal inferior walls, mildly elevated filling pressures, mild pulmonary HTN  . Reclast  2013  . Carpal tunnel release Right MAy 2015  . Left heart catheterization with coronary angiogram N/A 03/11/2012    Procedure: LEFT HEART CATHETERIZATION WITH CORONARY ANGIOGRAM;  Surgeon: Wellington Hampshire, MD;  Location: [facility];  Service: Cardiovascular;  Laterality: N/A;     reports that she has never smoked. She has never used smokeless tobacco. She reports that she drinks alcohol. She reports that she does not use illicit drugs.  Allergies  Allergen Reactions  . Estrogens     Breast pain  . Pneumococcal Vaccines     rash  . Bee Venom Rash  . Sulfa Antibiotics Rash    Family History  Problem Relation Age of Onset  . Colon cancer Father   . Hypertension Mother   . CAD Mother   . Heart attack Mother   . CAD Brother 49    CABG  . Cancer - Lung Brother   . Cancer - Lung Sister   . Hypertension Paternal Grandmother   . Breast cancer Paternal Grandfather     Prior to Admission medications     Medication Sig Start Date End Date Taking? Authorizing Provider  ALPRAZolam Duanne Moron) 0.25 MG tablet Take 0.25 mg by mouth at bedtime as needed for anxiety.   Yes Historical Provider, MD  aspirin 81 MG tablet Take 81 mg by mouth daily.   Yes Historical Provider, MD  atorvastatin (LIPITOR) 80 MG tablet Take 1 tablet (80 mg total) by mouth daily. 11/03/14  Yes Minus Breeding, MD  cholecalciferol (VITAMIN D) 1000 UNITS tablet Take 1,000 Units by mouth daily.    Yes Historical Provider, MD  Cranberry (ELLURA PO) Take 1 tablet by mouth daily.    Yes Historical Provider, MD  lisinopril (PRINIVIL,ZESTRIL) 5 MG tablet Take 1 tablet (5 mg total) by mouth every morning. 11/03/14  Yes Minus Breeding, MD  metoprolol tartrate (LOPRESSOR) 25 MG tablet Take 1 tablet (25 mg total) by mouth 2 (two) times daily. 11/03/14  Yes Minus Breeding, MD  Multiple Vitamins-Minerals (ICAPS) CAPS Take 1 capsule by mouth every morning.   Yes Historical Provider, MD  nitroGLYCERIN (NITROSTAT) 0.4 MG SL tablet Place 1 tablet (0.4 mg total) under the tongue every 5 (five) minutes x 3 doses as needed for chest pain. 07/14/14  Yes Minus Breeding, MD  Omega-3 Fatty Acids (FISH OIL) 1000 MG CAPS Take 1 capsule by mouth daily.   Yes Historical Provider, MD  zoledronic acid (RECLAST) 5 MG/100ML SOLN injection Inject 5 mg into the vein once. Next dose due in July 2017   Yes Historical Provider, MD  clopidogrel (PLAVIX) 75 MG tablet TAKE 1 TABLET (75 MG TOTAL) BY MOUTH DAILY WITH BREAKFAST. Patient not taking: Reported on 07/07/2015 04/13/15   Minus Breeding, MD    Physical Exam: Filed Vitals:   07/07/15 2157 07/07/15 2300 07/07/15 2345 07/08/15 0101  BP: 115/78 148/82 138/90 109/75  Pulse: 70 69 67 77  Temp:      TempSrc:      Resp: 18 22 15 17   SpO2: 93% 96% 98% 95%      Constitutional: NAD, calm, comfortable Filed Vitals:   07/07/15 2157 07/07/15 2300 07/07/15 2345 07/08/15 0101  BP: 115/78 148/82 138/90 109/75  Pulse: 70 69 67  77  Temp:      TempSrc:      Resp: 18 22 15 17   SpO2: 93% 96% 98% 95%   Eyes: PERRL, lids and conjunctivae normal ENMT: Mucous membranes are moist. Posterior pharynx clear of any exudate or lesions.Normal dentition.  Neck: normal, supple, no masses, no thyromegaly Respiratory: clear to auscultation bilaterally, no wheezing, no crackles. Normal respiratory effort. No accessory muscle use.  Cardiovascular: Regular rate and rhythm, no murmurs / rubs / gallops. No extremity edema. 2+ pedal pulses. No carotid bruits.  Abdomen: no tenderness, no masses palpated. No hepatosplenomegaly. Bowel sounds positive.  Musculoskeletal: no clubbing / cyanosis. No joint deformity upper and lower extremities. Good ROM, no contractures.  Normal muscle tone.  Skin: no rashes, lesions, ulcers. No induration Neurologic: CN 2-12 grossly intact. Sensation intact, DTR normal. Strength 5/5 in all 4.  Psychiatric: Normal judgment and insight. Alert and oriented x 3. Normal mood.     Labs on Admission: I have personally reviewed following labs and imaging studies  CBC:  Recent Labs Lab 07/07/15 2035  WBC 8.7  HGB 13.5  HCT 40.8  MCV 93.2  PLT 0000000   Basic Metabolic Panel:  Recent Labs Lab 07/07/15 2035  NA 134*  K 4.0  CL 102  CO2 25  GLUCOSE 122*  BUN 26*  CREATININE 1.02*  CALCIUM 9.9   GFR: CrCl cannot be calculated (Unknown ideal weight.). Liver Function Tests: No results for input(s): AST, ALT, ALKPHOS, BILITOT, PROT, ALBUMIN in the last 168 hours. No results for input(s): LIPASE, AMYLASE in the last 168 hours. No results for input(s): AMMONIA in the last 168 hours. Coagulation Profile: No results for input(s): INR, PROTIME in the last 168 hours. Cardiac Enzymes:  Recent Labs Lab 07/07/15 2035 07/07/15 2350  TROPONINI <0.03 <0.03   BNP (last 3 results) No results for input(s): PROBNP in the last 8760 hours. HbA1C: No results for input(s): HGBA1C in the last 72 hours. CBG: No  results for input(s): GLUCAP in the last 168 hours. Lipid Profile: No results for input(s): CHOL, HDL, LDLCALC, TRIG, CHOLHDL, LDLDIRECT in the last 72 hours. Thyroid Function Tests: No results for input(s): TSH, T4TOTAL, FREET4, T3FREE, THYROIDAB in the last 72 hours. Anemia Panel: No results for input(s): VITAMINB12, FOLATE, FERRITIN, TIBC, IRON, RETICCTPCT in the last 72 hours. Urine analysis:    Component Value Date/Time   COLORURINE YELLOW 04/29/2013 2029   APPEARANCEUR CLEAR 04/29/2013 2029   LABSPEC 1.008 04/29/2013 2029   PHURINE 6.5 04/29/2013 2029   GLUCOSEU NEGATIVE 04/29/2013 2029   HGBUR NEGATIVE 04/29/2013 2029   [place] NEGATIVE 04/29/2013 2029   [place] NEGATIVE 04/29/2013 2029   PROTEINUR NEGATIVE 04/29/2013 2029   UROBILINOGEN 0.2 04/29/2013 2029   NITRITE NEGATIVE 04/29/2013 2029   LEUKOCYTESUR SMALL* 04/29/2013 2029   Sepsis Labs: No results found for this or any previous visit (from the past 240 hour(s)).   Radiological Exams on Admission: Dg Chest 2 View  07/07/2015  CLINICAL DATA:  Chest and left arm pain beginning at 6:30 p.m. today. EXAM: CHEST  2 VIEW COMPARISON:  Single-view of the chest 04/19/2013 and 03/08/2012. FINDINGS: The lungs are clear. Heart size is normal. Tortuous aorta is again seen. No pneumothorax or pleural effusion. IMPRESSION: No acute disease.  Stable compared to prior exam. Electronically Signed   By: Inge Rise M.D.   On: 07/07/2015 21:07    EKG: Independently reviewed.  Sinus rhythm with LVH  Assessment/Plan Suspect atypical chest pain: Acute. Patient was previously noted to have atypical symptoms with presentation of MI. Left arm pain resolved after nitroglycerin. Troponins negative 2 - Admit to telemetry bed - Trend cardiac troponins - Recheck EKG in a.m. - Nitroglycerin prn  - Check echocardiogram - Determine if formal cardiology consult is needed in a.m.  Coronary artery disease status post drug-eluting stent in  2014. - Continue Plavix and aspirin  Diastolic dysfunction: Last echocardiogram in 03/2013 showed EF of 0000000 grade 1 diastolic dysfunction. Patient appears to be euvolemic. Chest x-ray showed no acute abnormalities. - Follow-up echocardiogram  Essential hypertension - Continue metoprolol and lisinopril  Hyponatremia: Sodium 134 on admission suspect that this could possibly be due to some dehydration. Especially with  BUN 26 and creatinine of 1.02. - Started on gentle IV fluids  Anxiety - Continue Xanax prn  Hyperlipidemia - continue atorvastatin and Omega-3 fatty acids  DVT prophylaxis:  Lovenox Code Status: full Family Communication:   Disposition Plan: possible discharge home if workup negative Consults called: none Admission stattelemetry observation  Norval Morton MD [facility] Pager [phone]  If 7PM-7AM, please contact night-coverage [email or web address] Password [email or web address]  07/08/2015, 1:06 AM

## 2015-07-08 NOTE — Telephone Encounter (Signed)
Returned call. Hosp notes to be reviewed by Dr. Percival Spanish.   Informed patient we can schedule for open spots on June 23rd but she declined since she plans to leave for vacation on 20th. Advised best option is APP next week but would see if Dr. Percival Spanish needs to see patient - other option is 19th but schedule currently full. Routed to Dr. Percival Spanish to review.

## 2015-07-09 NOTE — Telephone Encounter (Signed)
Pt added for 7:30a appt. I have called and notified the patient, she acknowledged appt info and expressed thanks for Dr. Rosezella [place] accomodation.

## 2015-07-09 NOTE — Telephone Encounter (Signed)
OK to add to schedule at 7:30 on the 19th.

## 2015-07-15 DIAGNOSIS — M79602 Pain in left arm: Secondary | ICD-10-CM | POA: Diagnosis not present

## 2015-07-15 DIAGNOSIS — F419 Anxiety disorder, unspecified: Secondary | ICD-10-CM | POA: Diagnosis not present

## 2015-07-16 DIAGNOSIS — Z803 Family history of malignant neoplasm of breast: Secondary | ICD-10-CM | POA: Diagnosis not present

## 2015-07-16 DIAGNOSIS — Z1231 Encounter for screening mammogram for malignant neoplasm of breast: Secondary | ICD-10-CM | POA: Diagnosis not present

## 2015-07-16 DIAGNOSIS — M810 Age-related osteoporosis without current pathological fracture: Secondary | ICD-10-CM | POA: Diagnosis not present

## 2015-07-18 NOTE — Progress Notes (Signed)
HPI The patient presents for evaluation of CAD.  She had PCI of an LAD ruptured plaque in 2014. At that time she presented with jaw pain.  She was recently in the hospital with chest pain.  I reviewed these records.  This was atypical and not felt to be anginal.  She had an echo which showed dynamic outflow obstruction with Valsalva with a peak gradient of 13 mm Hg.    She returns for follow up after this. She was having arm pain.  Since that time she has had no further symptoms.  The patient denies any new symptoms such as chest discomfort, neck or arm discomfort. There has been no new shortness of breath, PND or orthopnea. There have been no reported palpitations, presyncope or syncope.  She has not been exercising yet.  Of not this was not similar to her pervious neck pain that she had with her angina.     Allergies  Allergen Reactions  . Estrogens     Breast pain  . Pneumococcal Vaccines     rash  . Bee Venom Rash  . Sulfa Antibiotics Rash    Current Outpatient Prescriptions  Medication Sig Dispense Refill  . ALPRAZolam (XANAX) 0.25 MG tablet Take 0.25 mg by mouth at bedtime as needed for anxiety.    [name] aspirin 81 MG tablet Take 81 mg by mouth daily.    [name] atorvastatin (LIPITOR) 80 MG tablet Take 1 tablet (80 mg total) by mouth daily. 90 tablet 3  . cholecalciferol (VITAMIN D) 1000 UNITS tablet Take 1,000 Units by mouth daily.     . Cranberry (ELLURA PO) Take 1 tablet by mouth daily.     [name] lisinopril (PRINIVIL,ZESTRIL) 5 MG tablet Take 1 tablet (5 mg total) by mouth every morning. 90 tablet 3  . metoprolol tartrate (LOPRESSOR) 25 MG tablet Take 1 tablet (25 mg total) by mouth 2 (two) times daily. 180 tablet 3  . Multiple Vitamins-Minerals (ICAPS) CAPS Take 1 capsule by mouth every morning.    . nitroGLYCERIN (NITROSTAT) 0.4 MG SL tablet Place 1 tablet (0.4 mg total) under the tongue every 5 (five) minutes x 3 doses as needed for chest pain. 25 tablet 3  . Omega-3 Fatty Acids (FISH  OIL) 1000 MG CAPS Take 1 capsule by mouth daily.    . zoledronic acid (RECLAST) 5 MG/100ML SOLN injection Inject 5 mg into the vein once. Next dose due in July 2017     No current facility-administered medications for this visit.    Past Medical History  Diagnosis Date  . Hypertension   . Self-catheterizes urinary bladder   . UTI (lower urinary tract infection)   . Macular degeneration   . CAD in native artery     a. NSTEMI 2/14 => LHC 03/08/12: Mid LAD 95% (hazy-suggestive of a ruptured plaque), EF 40%, distal anterior, apical and distal inferior HK =>  PCI: Xience DES to the mid LAD.  [name] Cardiomyopathy ([place])     LVEF 45% on cath/echo 03/2012, suspected to be ischemic vs stress-induced  . Mild mitral regurgitation     a. Echo 03/09/12: Mild focal basal hypertrophy the septum, EF 45-50%, distal anteroseptal, anterolateral, inferolateral, inferoseptal and apical HK, grade 2 diastolic dysfunction, mild MR, PASP 52  . Hyperlipidemia   . Anxiety   . Bilateral bunions   . Osteoporosis   . Macular degeneration   . Depression   . Diverticulosis   . Fracture 2013    Right foot fracture-  did not require surgery    Past Surgical History  Procedure Laterality Date  . Abdominal hysterectomy  1980  . Back surgery  1986  . Coronary angioplasty with stent placement  03/11/12    95% mid LAD stenosis s/p DES, minimal nonobs dz elsewhere; LVEF 40%, moderate hypokinesis of distal anterior, apical and distal inferior walls, mildly elevated filling pressures, mild pulmonary HTN  . Reclast  2013  . Carpal tunnel release Right MAy 2015  . Left heart catheterization with coronary angiogram N/A 03/11/2012    Procedure: LEFT HEART CATHETERIZATION WITH CORONARY ANGIOGRAM;  Surgeon: Wellington Hampshire, MD;  Location: [facility];  Service: Cardiovascular;  Laterality: N/A;    ROS:  As stated in the HPI and negative for all other systems.  PHYSICAL EXAM There were no vitals taken for this visit. GENERAL:  Well  appearing NECK:  No jugular venous distention, waveform within normal limits, carotid upstroke brisk and symmetric, no bruits, no thyromegaly LYMPHATICS:  No cervical, inguinal adenopathy LUNGS:  Clear to auscultation bilaterally CHEST:  Unremarkable HEART:  PMI not displaced or sustained,S1 and S2 within normal limits, no S3, no S4, no clicks, no rubs, 2/6 apical systolic murmur, no radiation. ABD:  Flat, positive bowel sounds normal in frequency in pitch, no bruits, no rebound, no guarding, no midline pulsatile mass, no hepatomegaly, no splenomegaly EXT:  2 plus pulses throughout, no edema, no cyanosis no clubbing    ASSESSMENT AND PLAN  CHEST PAIN:  (Arm pain).  I will bring the patient back for a POET (Plain Old Exercise Test). This will allow me to screen for obstructive coronary disease, risk stratify and very importantly provide a prescription for exercise.  CAD:  As above  HTN:  The blood pressure is at target. No change in medications is indicated. We will continue with therapeutic lifestyle changes (TLC).  HYPERLIPIDEMIA:     She needs to come back for a fasting lipid profile.    Recent hospital records reviewed.

## 2015-07-19 ENCOUNTER — Ambulatory Visit (INDEPENDENT_AMBULATORY_CARE_PROVIDER_SITE_OTHER): Payer: PPO | Admitting: Cardiology

## 2015-07-19 ENCOUNTER — Ambulatory Visit (INDEPENDENT_AMBULATORY_CARE_PROVIDER_SITE_OTHER): Payer: PPO

## 2015-07-19 ENCOUNTER — Encounter: Payer: Self-pay | Admitting: Cardiology

## 2015-07-19 ENCOUNTER — Other Ambulatory Visit: Payer: Self-pay | Admitting: Cardiology

## 2015-07-19 VITALS — BP 126/83 | HR 70 | Ht 65.0 in | Wt 156.4 lb

## 2015-07-19 DIAGNOSIS — R079 Chest pain, unspecified: Secondary | ICD-10-CM

## 2015-07-19 DIAGNOSIS — F419 Anxiety disorder, unspecified: Secondary | ICD-10-CM | POA: Diagnosis not present

## 2015-07-19 DIAGNOSIS — I2510 Atherosclerotic heart disease of native coronary artery without angina pectoris: Secondary | ICD-10-CM

## 2015-07-19 DIAGNOSIS — R0602 Shortness of breath: Secondary | ICD-10-CM | POA: Diagnosis not present

## 2015-07-19 DIAGNOSIS — T7840XA Allergy, unspecified, initial encounter: Secondary | ICD-10-CM | POA: Diagnosis not present

## 2015-07-19 DIAGNOSIS — M79602 Pain in left arm: Secondary | ICD-10-CM | POA: Diagnosis not present

## 2015-07-19 LAB — EXERCISE TOLERANCE TEST
CHL RATE OF PERCEIVED EXERTION: 11
CSEPED: 5 min
CSEPEW: 7 METS
CSEPPHR: 146 {beats}/min
Exercise duration (sec): 31 s
MPHR: 140 {beats}/min
Percent HR: 104 %
Rest HR: 75 {beats}/min

## 2015-07-19 NOTE — Patient Instructions (Signed)
Medication Instructions:  Continue current medications  Labwork: NONE  Testing/Procedures: Your physician has requested that you have an exercise tolerance test. For further information please visit [email or web address]. Please also follow instruction sheet, as given.  Follow-Up: Your physician wants you to follow-up in: 1 Year. You will receive a reminder letter in the mail two months in advance. If you don't receive a letter, please call our office to schedule the follow-up appointment.   Any Other Special Instructions Will Be Listed Below (If Applicable).   If you need a refill on your cardiac medications before your next appointment, please call your pharmacy.

## 2015-08-02 DIAGNOSIS — R339 Retention of urine, unspecified: Secondary | ICD-10-CM | POA: Diagnosis not present

## 2015-08-09 DIAGNOSIS — R339 Retention of urine, unspecified: Secondary | ICD-10-CM | POA: Diagnosis not present

## 2015-08-10 DIAGNOSIS — H43811 Vitreous degeneration, right eye: Secondary | ICD-10-CM | POA: Diagnosis not present

## 2015-08-10 DIAGNOSIS — H35052 Retinal neovascularization, unspecified, left eye: Secondary | ICD-10-CM | POA: Diagnosis not present

## 2015-08-10 DIAGNOSIS — H353221 Exudative age-related macular degeneration, left eye, with active choroidal neovascularization: Secondary | ICD-10-CM | POA: Diagnosis not present

## 2015-08-10 DIAGNOSIS — H353132 Nonexudative age-related macular degeneration, bilateral, intermediate dry stage: Secondary | ICD-10-CM | POA: Diagnosis not present

## 2015-08-12 DIAGNOSIS — H353221 Exudative age-related macular degeneration, left eye, with active choroidal neovascularization: Secondary | ICD-10-CM | POA: Diagnosis not present

## 2015-08-16 DIAGNOSIS — F419 Anxiety disorder, unspecified: Secondary | ICD-10-CM | POA: Diagnosis not present

## 2015-08-16 DIAGNOSIS — M810 Age-related osteoporosis without current pathological fracture: Secondary | ICD-10-CM | POA: Diagnosis not present

## 2015-08-26 ENCOUNTER — Ambulatory Visit (HOSPITAL_COMMUNITY)
Admission: RE | Admit: 2015-08-26 | Discharge: 2015-08-26 | Disposition: A | Discharge status: Home / Self Care | Payer: PPO | Source: Ambulatory Visit | Attending: Family Medicine | Admitting: Family Medicine

## 2015-08-26 ENCOUNTER — Encounter (HOSPITAL_COMMUNITY): Payer: Self-pay

## 2015-08-26 DIAGNOSIS — M810 Age-related osteoporosis without current pathological fracture: Secondary | ICD-10-CM | POA: Insufficient documentation

## 2015-08-26 MED ORDER — ZOLEDRONIC ACID 5 MG/100ML IV SOLN
5.0000 mg | Freq: Once | INTRAVENOUS | Status: AC
  Administered 2015-08-26: 5 mg via INTRAVENOUS
  Filled 2015-08-26: qty 100

## 2015-08-26 MED ORDER — SODIUM CHLORIDE 0.9 % IV SOLN
Freq: Once | INTRAVENOUS | Status: AC
  Administered 2015-08-26: 10:00:00 via INTRAVENOUS

## 2015-08-26 NOTE — Progress Notes (Signed)
Assessment complete at 1015

## 2015-08-26 NOTE — Discharge Instructions (Signed)
Zoledronic Acid injection / Reclast (Paget's Disease, Osteoporosis) What is this medicine? ZOLEDRONIC ACID (ZOE le dron ik AS id) lowers the amount of calcium loss from bone. It is used to treat Paget's disease and osteoporosis in women. This medicine may be used for other purposes; ask your health care provider or pharmacist if you have questions. What should I tell my health care provider before I take this medicine? They need to know if you have any of these conditions: -aspirin-sensitive asthma -cancer, especially if you are receiving medicines used to treat cancer -dental disease or wear dentures -infection -kidney disease -low levels of calcium in the blood -past surgery on the parathyroid gland or intestines -receiving corticosteroids like dexamethasone or prednisone -an unusual or allergic reaction to zoledronic acid, other medicines, foods, dyes, or preservatives -pregnant or trying to get pregnant -breast-feeding How should I use this medicine? This medicine is for infusion into a vein. It is given by a health care professional in a hospital or clinic setting. Talk to your pediatrician regarding the use of this medicine in children. This medicine is not approved for use in children. Overdosage: If you think you have taken too much of this medicine contact a poison control center or emergency room at once. NOTE: This medicine is only for you. Do not share this medicine with others. What if I miss a dose? It is important not to miss your dose. Call your doctor or health care professional if you are unable to keep an appointment. What may interact with this medicine? -certain antibiotics given by injection -NSAIDs, medicines for pain and inflammation, like ibuprofen or naproxen -some diuretics like bumetanide, furosemide -teriparatide This list may not describe all possible interactions. Give your health care provider a list of all the medicines, herbs, non-prescription drugs, or  dietary supplements you use. Also tell them if you smoke, drink alcohol, or use illegal drugs. Some items may interact with your medicine. What should I watch for while using this medicine? Visit your doctor or health care professional for regular checkups. It may be some time before you see the benefit from this medicine. Do not stop taking your medicine unless your doctor tells you to. Your doctor may order blood tests or other tests to see how you are doing. Women should inform their doctor if they wish to become pregnant or think they might be pregnant. There is a potential for serious side effects to an unborn child. Talk to your health care professional or pharmacist for more information. You should make sure that you get enough calcium and vitamin D while you are taking this medicine. Discuss the foods you eat and the vitamins you take with your health care professional. Some people who take this medicine have severe bone, joint, and/or muscle pain. This medicine may also increase your risk for jaw problems or a broken thigh bone. Tell your doctor right away if you have severe pain in your jaw, bones, joints, or muscles. Tell your doctor if you have any pain that does not go away or that gets worse. Tell your dentist and dental surgeon that you are taking this medicine. You should not have major dental surgery while on this medicine. See your dentist to have a dental exam and fix any dental problems before starting this medicine. Take good care of your teeth while on this medicine. Make sure you see your dentist for regular follow-up appointments. What side effects may I notice from receiving this medicine? Side effects that  you should report to your doctor or health care professional as soon as possible: -allergic reactions like skin rash, itching or hives, swelling of the face, lips, or tongue -anxiety, confusion, or depression -breathing problems -changes in vision -eye pain -feeling faint or  lightheaded, falls -jaw pain, especially after dental work -mouth sores -muscle cramps, stiffness, or weakness -redness, blistering, peeling or loosening of the skin, including inside the mouth -trouble passing urine or change in the amount of urine Side effects that usually do not require medical attention (report to your doctor or health care professional if they continue or are bothersome): -bone, joint, or muscle pain -constipation -diarrhea -fever -hair loss -irritation at site where injected -loss of appetite -nausea, vomiting -stomach upset -trouble sleeping -trouble swallowing -weak or tired This list may not describe all possible side effects. Call your doctor for medical advice about side effects. You may report side effects to FDA at 1-800-FDA-1088. Where should I keep my medicine? This drug is given in a hospital or clinic and will not be stored at home. NOTE: This sheet is a summary. It may not cover all possible information. If you have questions about this medicine, talk to your doctor, pharmacist, or health care provider.    2016, Elsevier/Gold Standard. (2013-06-14 14:19:57)

## 2015-09-01 DIAGNOSIS — N312 Flaccid neuropathic bladder, not elsewhere classified: Secondary | ICD-10-CM | POA: Diagnosis not present

## 2015-09-01 DIAGNOSIS — N3020 Other chronic cystitis without hematuria: Secondary | ICD-10-CM | POA: Diagnosis not present

## 2015-09-01 DIAGNOSIS — N952 Postmenopausal atrophic vaginitis: Secondary | ICD-10-CM | POA: Diagnosis not present

## 2015-09-01 DIAGNOSIS — N3000 Acute cystitis without hematuria: Secondary | ICD-10-CM | POA: Diagnosis not present

## 2015-09-01 DIAGNOSIS — R3914 Feeling of incomplete bladder emptying: Secondary | ICD-10-CM | POA: Diagnosis not present

## 2015-09-03 DIAGNOSIS — R339 Retention of urine, unspecified: Secondary | ICD-10-CM | POA: Diagnosis not present

## 2015-09-16 DIAGNOSIS — H353221 Exudative age-related macular degeneration, left eye, with active choroidal neovascularization: Secondary | ICD-10-CM | POA: Diagnosis not present

## 2015-09-20 ENCOUNTER — Emergency Department (HOSPITAL_COMMUNITY): Payer: PPO

## 2015-09-20 ENCOUNTER — Encounter (HOSPITAL_COMMUNITY): Payer: Self-pay | Admitting: *Deleted

## 2015-09-20 ENCOUNTER — Emergency Department (HOSPITAL_COMMUNITY)
Admission: EM | Admit: 2015-09-20 | Discharge: 2015-09-20 | Disposition: A | Discharge status: Home / Self Care | Payer: PPO | Attending: Emergency Medicine | Admitting: Emergency Medicine

## 2015-09-20 DIAGNOSIS — Z79899 Other long term (current) drug therapy: Secondary | ICD-10-CM | POA: Diagnosis not present

## 2015-09-20 DIAGNOSIS — R1032 Left lower quadrant pain: Secondary | ICD-10-CM | POA: Insufficient documentation

## 2015-09-20 DIAGNOSIS — Z955 Presence of coronary angioplasty implant and graft: Secondary | ICD-10-CM | POA: Diagnosis not present

## 2015-09-20 DIAGNOSIS — R109 Unspecified abdominal pain: Secondary | ICD-10-CM | POA: Diagnosis not present

## 2015-09-20 DIAGNOSIS — I252 Old myocardial infarction: Secondary | ICD-10-CM | POA: Diagnosis not present

## 2015-09-20 DIAGNOSIS — I2510 Atherosclerotic heart disease of native coronary artery without angina pectoris: Secondary | ICD-10-CM | POA: Insufficient documentation

## 2015-09-20 DIAGNOSIS — Z7982 Long term (current) use of aspirin: Secondary | ICD-10-CM | POA: Diagnosis not present

## 2015-09-20 DIAGNOSIS — N390 Urinary tract infection, site not specified: Secondary | ICD-10-CM | POA: Diagnosis not present

## 2015-09-20 DIAGNOSIS — I10 Essential (primary) hypertension: Secondary | ICD-10-CM | POA: Diagnosis not present

## 2015-09-20 LAB — URINE MICROSCOPIC-ADD ON: RBC / HPF: NONE SEEN RBC/hpf (ref 0–5)

## 2015-09-20 LAB — URINALYSIS, ROUTINE W REFLEX MICROSCOPIC
BILIRUBIN URINE: NEGATIVE
GLUCOSE, UA: NEGATIVE mg/dL
HGB URINE DIPSTICK: NEGATIVE
Ketones, ur: NEGATIVE mg/dL
Nitrite: POSITIVE — AB
PH: 6 (ref 5.0–8.0)
Protein, ur: NEGATIVE mg/dL
SPECIFIC GRAVITY, URINE: 1.015 (ref 1.005–1.030)

## 2015-09-20 LAB — CBC
HEMATOCRIT: 40 % (ref 36.0–46.0)
HEMOGLOBIN: 13 g/dL (ref 12.0–15.0)
MCH: 31.3 pg (ref 26.0–34.0)
MCHC: 32.5 g/dL (ref 30.0–36.0)
MCV: 96.4 fL (ref 78.0–100.0)
Platelets: 166 10*3/uL (ref 150–400)
RBC: 4.15 MIL/uL (ref 3.87–5.11)
RDW: 13.4 % (ref 11.5–15.5)
WBC: 6.5 10*3/uL (ref 4.0–10.5)

## 2015-09-20 LAB — COMPREHENSIVE METABOLIC PANEL
ALBUMIN: 3.8 g/dL (ref 3.5–5.0)
ALK PHOS: 60 U/L (ref 38–126)
ALT: 49 U/L (ref 14–54)
ANION GAP: 9 (ref 5–15)
AST: 52 U/L — ABNORMAL HIGH (ref 15–41)
BUN: 21 mg/dL — ABNORMAL HIGH (ref 6–20)
CALCIUM: 9.6 mg/dL (ref 8.9–10.3)
CHLORIDE: 105 mmol/L (ref 101–111)
CO2: 22 mmol/L (ref 22–32)
Creatinine, Ser: 1.11 mg/dL — ABNORMAL HIGH (ref 0.44–1.00)
GFR calc non Af Amer: 46 mL/min — ABNORMAL LOW (ref >60)
GFR, EST AFRICAN AMERICAN: 53 mL/min — AB (ref >60)
GLUCOSE: 125 mg/dL — AB (ref 65–99)
Potassium: 4.2 mmol/L (ref 3.5–5.1)
SODIUM: 136 mmol/L (ref 135–145)
Total Bilirubin: 0.7 mg/dL (ref 0.3–1.2)
Total Protein: 6.6 g/dL (ref 6.5–8.1)

## 2015-09-20 LAB — LIPASE, BLOOD: LIPASE: 32 U/L (ref 11–51)

## 2015-09-20 MED ORDER — IOPAMIDOL (ISOVUE-300) INJECTION 61%
INTRAVENOUS | Status: AC
  Administered 2015-09-20: 80 mL
  Filled 2015-09-20: qty 100

## 2015-09-20 MED ORDER — MORPHINE SULFATE (PF) 4 MG/ML IV SOLN
4.0000 mg | Freq: Once | INTRAVENOUS | Status: AC
Start: 2015-09-20 — End: 2015-09-20
  Administered 2015-09-20: 4 mg via INTRAVENOUS
  Filled 2015-09-20: qty 1

## 2015-09-20 MED ORDER — SODIUM CHLORIDE 0.9 % IV BOLUS (SEPSIS)
500.0000 mL | Freq: Once | INTRAVENOUS | Status: AC
  Administered 2015-09-20: 500 mL via INTRAVENOUS

## 2015-09-20 MED ORDER — DEXTROSE 5 % IV SOLN: 1.0000 g | Freq: Once | INTRAVENOUS | Status: DC

## 2015-09-20 MED ORDER — ONDANSETRON 4 MG PO TBDP
4.0000 mg | ORAL_TABLET | Freq: Once | ORAL | Status: AC | PRN
  Administered 2015-09-20: 4 mg via ORAL

## 2015-09-20 MED ORDER — ONDANSETRON 4 MG PO TBDP
ORAL_TABLET | ORAL | Status: AC
  Filled 2015-09-20: qty 1

## 2015-09-20 MED ORDER — CIPROFLOXACIN IN D5W 400 MG/200ML IV SOLN
400.0000 mg | Freq: Once | INTRAVENOUS | Status: AC
  Administered 2015-09-20: 400 mg via INTRAVENOUS
  Filled 2015-09-20: qty 200

## 2015-09-20 MED ORDER — OXYCODONE-ACETAMINOPHEN 5-325 MG PO TABS: 1.0000 | ORAL_TABLET | ORAL | 0 refills | Status: DC | PRN

## 2015-09-20 MED ORDER — CIPROFLOXACIN HCL 500 MG PO TABS: 500.0000 mg | ORAL_TABLET | Freq: Two times a day (BID) | ORAL | 0 refills | Status: DC

## 2015-09-20 MED ORDER — ONDANSETRON 4 MG PO TBDP: 4.0000 mg | ORAL_TABLET | Freq: Three times a day (TID) | ORAL | 0 refills | Status: DC | PRN

## 2015-09-20 MED ORDER — SODIUM CHLORIDE 0.9 % IV BOLUS (SEPSIS)
1000.0000 mL | Freq: Once | INTRAVENOUS | Status: AC
  Administered 2015-09-20: 1000 mL via INTRAVENOUS

## 2015-09-20 MED ORDER — HYDROMORPHONE HCL 1 MG/ML IJ SOLN
0.5000 mg | Freq: Once | INTRAMUSCULAR | Status: AC
  Administered 2015-09-20: 0.5 mg via INTRAVENOUS
  Filled 2015-09-20: qty 1

## 2015-09-20 NOTE — ED Provider Notes (Addendum)
  Face-to-face evaluation   History: She presents for lower abdominal pain.  Physical exam: Alert, responsive, appears comfortable. Pain has required treatment with pain medications.   As discussed with patient and family members, all questions answered  Medical screening examination/treatment/procedure(s) were conducted as a shared visit with non-physician practitioner(s) and myself.  I personally evaluated the patient during the encounter       Daleen Bo, MD 09/20/15 2110

## 2015-09-20 NOTE — ED Provider Notes (Signed)
[facility] DEPT Provider Note   CSN: [id number] Arrival date & time: 09/20/15  1208     History   Chief Complaint Chief Complaint  Patient presents with  . Flank Pain  . Nausea    HPI Erin Roth is a 82 y.o. female who presents with gradual onset of LLQ abdominal pain. PMH significant for diverticulosis, frequent UTIs due to self-caths 2-3 times a day due to inability to empty her bladder, CAD, Cardiomyopathy, Anxiety/depression, HLD, HTN, mild mitral regurg. She is s/p hysterectomy. She states that she was at work today when she had an onset of pain. It is in the LLQ and radiates to the L flank. It is constant, never had this pain before. The pain continued so she came to the ED for further evaluation. She has had a colonoscopy which showed diverticulosis. Has not taken anything for pain. She recently completed a course of Keflex due to UTI. Reports associated nausea and malodorous urine. Denies fever, chills, chest pain, SOB, right sided abdominal pain, vomiting, diarrhea/constipation, hematuria.  HPI  Past Medical History:  Diagnosis Date  . Anxiety   . Bilateral bunions   . CAD in native artery    a. NSTEMI 2/14 => LHC 03/08/12: Mid LAD 95% (hazy-suggestive of a ruptured plaque), EF 40%, distal anterior, apical and distal inferior HK =>  PCI: Xience DES to the mid LAD.  [name] Cardiomyopathy ([place])    LVEF 45% on cath/echo 03/2012, suspected to be ischemic vs stress-induced  . Depression   . Diverticulosis   . Fracture 2013   Right foot fracture- did not require surgery  . Hyperlipidemia   . Hypertension   . Macular degeneration   . Macular degeneration   . Mild mitral regurgitation    a. Echo 03/09/12: Mild focal basal hypertrophy the septum, EF 45-50%, distal anteroseptal, anterolateral, inferolateral, inferoseptal and apical HK, grade 2 diastolic dysfunction, mild MR, PASP 52  . Osteoporosis   . Self-catheterizes urinary bladder   . UTI (lower urinary tract infection)      Patient Active Problem List   Diagnosis Date Noted  . History of MI (myocardial infarction) 07/08/2015  . Dynamic left ventricular outflow obstruction 07/08/2015  . Disturbance of skin sensation 02/19/2013  . Anxiety 03/12/2012  . Hyperlipidemia 03/12/2012  . Urinary retention 03/12/2012  . Essential hypertension 03/12/2012  . CAD (coronary artery disease), native coronary artery 03/12/2012  . Cardiomyopathy ([place]) 03/12/2012  . Mild pulmonary hypertension ([place]) 03/12/2012  . Mild mitral regurgitation 03/12/2012  . NSTEMI (non-ST elevated myocardial infarction) ([place]) 03/09/2012    Past Surgical History:  Procedure Laterality Date  . ABDOMINAL HYSTERECTOMY  1980  . BACK SURGERY  1986  . CARPAL TUNNEL RELEASE Right MAy 2015  . CORONARY ANGIOPLASTY WITH STENT PLACEMENT  03/11/12   95% mid LAD stenosis s/p DES, minimal nonobs dz elsewhere; LVEF 40%, moderate hypokinesis of distal anterior, apical and distal inferior walls, mildly elevated filling pressures, mild pulmonary HTN  . LEFT HEART CATHETERIZATION WITH CORONARY ANGIOGRAM N/A 03/11/2012   Procedure: LEFT HEART CATHETERIZATION WITH CORONARY ANGIOGRAM;  Surgeon: Wellington Hampshire, MD;  Location: [facility] CATH LAB;  Service: Cardiovascular;  Laterality: N/A;  . reclast  2013    OB History    No data available       Home Medications    Prior to Admission medications   Medication Sig Start Date End Date Taking? Authorizing Provider  ALPRAZolam Duanne Moron) 0.5 MG tablet Take 1 tablet by mouth daily. 07/15/15  Historical Provider, MD  aspirin 81 MG tablet Take 81 mg by mouth daily.    Historical Provider, MD  atorvastatin (LIPITOR) 80 MG tablet Take 1 tablet (80 mg total) by mouth daily. 11/03/14   Minus Breeding, MD  cholecalciferol (VITAMIN D) 1000 UNITS tablet Take 1,000 Units by mouth daily.     Historical Provider, MD  Cranberry (ELLURA PO) Take 1 tablet by mouth daily.     Historical Provider, MD  lisinopril (PRINIVIL,ZESTRIL) 5  MG tablet Take 1 tablet (5 mg total) by mouth every morning. 11/03/14   Minus Breeding, MD  metoprolol tartrate (LOPRESSOR) 25 MG tablet Take 1 tablet (25 mg total) by mouth 2 (two) times daily. 11/03/14   Minus Breeding, MD  Multiple Vitamins-Minerals (ICAPS) CAPS Take 1 capsule by mouth every morning.    Historical Provider, MD  nitroGLYCERIN (NITROSTAT) 0.4 MG SL tablet Place 1 tablet (0.4 mg total) under the tongue every 5 (five) minutes x 3 doses as needed for chest pain. 07/14/14   Minus Breeding, MD  Omega-3 Fatty Acids (FISH OIL) 1000 MG CAPS Take 1 capsule by mouth daily.    Historical Provider, MD  zoledronic acid (RECLAST) 5 MG/100ML SOLN injection Inject 5 mg into the vein once. Next dose due in July 2017    Historical Provider, MD    Family History Family History  Problem Relation Age of Onset  . Colon cancer Father   . Hypertension Mother   . CAD Mother   . Heart attack Mother   . CAD Brother 8    CABG  . Cancer - Lung Brother   . Cancer - Lung Sister   . Hypertension Paternal Grandmother   . Breast cancer Paternal Grandfather     Social History Social History  Substance Use Topics  . Smoking status: Never Smoker  . Smokeless tobacco: Never Used  . Alcohol use Yes     Comment: 4 glasses of wine per week     Allergies   Estrogens; Pneumococcal vaccines; Bee venom; Lexapro [escitalopram oxalate]; and Sulfa antibiotics   Review of Systems Review of Systems  Constitutional: Negative for chills and fever.  Respiratory: Negative for shortness of breath.   Cardiovascular: Negative for chest pain.  Gastrointestinal: Positive for abdominal pain and nausea. Negative for blood in stool, constipation, diarrhea and vomiting.  Genitourinary: Positive for flank pain. Negative for dysuria and frequency.     Physical Exam Updated Vital Signs BP (!) 140/107 (BP Location: Left Arm)   Pulse 60   Temp 98.5 F (36.9 C) (Oral)   SpO2 98%   Physical Exam  Constitutional:  She is oriented to person, place, and time. She appears well-developed and well-nourished. No distress.  HENT:  Head: Normocephalic and atraumatic.  Eyes: Conjunctivae are normal. Pupils are equal, round, and reactive to light. Right eye exhibits no discharge. Left eye exhibits no discharge. No scleral icterus.  Neck: Normal range of motion. Neck supple.  Cardiovascular: Normal rate and regular rhythm.  Exam reveals no gallop and no friction rub.   No murmur heard. Pulmonary/Chest: Effort normal and breath sounds normal. No respiratory distress. She has no wheezes. She has no rales. She exhibits no tenderness.  Abdominal: Soft. Bowel sounds are normal. She exhibits no distension and no mass. There is tenderness. There is no rebound and no guarding. No hernia.  Mild L flank tenderness. Moderate LLQ tenderness  Musculoskeletal: She exhibits no edema.  Neurological: She is alert and oriented to person, place, and  time.  Skin: Skin is warm and dry.  Psychiatric: She has a normal mood and affect. Her behavior is normal.  Nursing note and vitals reviewed.    ED Treatments / Results  Labs (all labs ordered are listed, but only abnormal results are displayed) Labs Reviewed  COMPREHENSIVE METABOLIC PANEL - Abnormal; Notable for the following:       Result Value   Glucose, Bld 125 (*)    BUN 21 (*)    Creatinine, Ser 1.11 (*)    AST 52 (*)    GFR calc non Af Amer 46 (*)    GFR calc Af Amer 53 (*)    All other components within normal limits  LIPASE, BLOOD  CBC  URINALYSIS, ROUTINE W REFLEX MICROSCOPIC (NOT AT [facility])    EKG  EKG Interpretation None       Radiology No results found.  Procedures Procedures (including critical care time)  Medications Ordered in ED Medications  ondansetron (ZOFRAN-ODT) 4 MG disintegrating tablet (not administered)  ondansetron (ZOFRAN-ODT) disintegrating tablet 4 mg (4 mg Oral Given 09/20/15 1220)  sodium chloride 0.9 % bolus 1,000 mL (1,000 mLs  Intravenous New Bag/Given 09/20/15 1433)  HYDROmorphone (DILAUDID) injection 0.5 mg (0.5 mg Intravenous Given 09/20/15 1433)     Initial Impression / Assessment and Plan / ED Course  I have reviewed the triage vital signs and the nursing notes.  Pertinent labs & imaging results that were available during my care of the patient were reviewed by me and considered in my medical decision making (see chart for details).  Clinical Course   82 year old female presents with LLQ pain. Patient is afebrile, not tachycardic or tachypneic, normotensive, and not hypoxic. She was given pain medicine, IVF, and Zofran with good relief of symptoms. CBC unremarkable. CMP remarkable for mild renal insufficiency and mild hyperglycemia. UA pending. CT of abdomen pending to r/o diverticulitis vs kidney stone vs infection. Anticipate d/c since vital signs are WNL and stable and labs appear overall normal.   Final Clinical Impressions(s) / ED Diagnoses   Final diagnoses:  LLQ pain    New Prescriptions New Prescriptions   No medications on file     Recardo Evangelist, PA-C 09/20/15 [place], MD 09/20/15 2109

## 2015-09-20 NOTE — ED Triage Notes (Signed)
Pt reports onset today of left side back pain that radiates around to abd. Having nausea. Denies fever. Does urinary cath at home.

## 2015-09-20 NOTE — Discharge Instructions (Signed)
Take the antibiotics as directed.  If your urine culture results are abnormal or your antibiotics need to be changed we will contact you. Take the pain and nausea medication as directed. Use caution when taking Percocet, he can make you sleepy/drowsy. If the pain medication begins to cause issues with constipation, may wish to try an over-the-counter stool softener such as MiraLAX, Colace, Dulcolax, etc. Follow-up with your primary care doctor. Return here for any new or worsening symptoms.

## 2015-09-20 NOTE — ED Provider Notes (Signed)
Patient received in sign out from [place] at shift change.  See her note for full H&P.  Briefly, 82 y.o. F here with left flank and lower abdominal pain.  Recently on keflex for UTI.  Does perform self caths at home 2-3x daily due to neurogenic bladder.  Labs thus far reassuring.    Plan:  CT pending as well as UA, likely discharge.  Results for orders placed or performed during the hospital encounter of 09/20/15  Lipase, blood  Result Value Ref Range   Lipase 32 11 - 51 U/L  Comprehensive metabolic panel  Result Value Ref Range   Sodium 136 135 - 145 mmol/L   Potassium 4.2 3.5 - 5.1 mmol/L   Chloride 105 101 - 111 mmol/L   CO2 22 22 - 32 mmol/L   Glucose, Bld 125 (H) 65 - 99 mg/dL   BUN 21 (H) 6 - 20 mg/dL   Creatinine, Ser 1.11 (H) 0.44 - 1.00 mg/dL   Calcium 9.6 8.9 - 10.3 mg/dL   Total Protein 6.6 6.5 - 8.1 g/dL   Albumin 3.8 3.5 - 5.0 g/dL   AST 52 (H) 15 - 41 U/L   ALT 49 14 - 54 U/L   Alkaline Phosphatase 60 38 - 126 U/L   Total Bilirubin 0.7 0.3 - 1.2 mg/dL   GFR calc non Af Amer 46 (L) >60 mL/min   GFR calc Af Amer 53 (L) >60 mL/min   Anion gap 9 5 - 15  CBC  Result Value Ref Range   WBC 6.5 4.0 - 10.5 K/uL   RBC 4.15 3.87 - 5.11 MIL/uL   Hemoglobin 13.0 12.0 - 15.0 g/dL   HCT 40.0 36.0 - 46.0 %   MCV 96.4 78.0 - 100.0 fL   MCH 31.3 26.0 - 34.0 pg   MCHC 32.5 30.0 - 36.0 g/dL   RDW 13.4 11.5 - 15.5 %   Platelets 166 150 - 400 K/uL  Urinalysis, Routine w reflex microscopic  Result Value Ref Range   Color, Urine YELLOW YELLOW   APPearance HAZY (A) CLEAR   Specific Gravity, Urine 1.015 1.005 - 1.030   pH 6.0 5.0 - 8.0   Glucose, UA NEGATIVE NEGATIVE mg/dL   Hgb urine dipstick NEGATIVE NEGATIVE   Bilirubin Urine NEGATIVE NEGATIVE   Ketones, ur NEGATIVE NEGATIVE mg/dL   Protein, ur NEGATIVE NEGATIVE mg/dL   Nitrite POSITIVE (A) NEGATIVE   Leukocytes, UA LARGE (A) NEGATIVE  Urine microscopic-add on  Result Value Ref Range   Squamous Epithelial / LPF 6-30 (A)  NONE SEEN   WBC, UA 6-30 0 - 5 WBC/hpf   RBC / HPF NONE SEEN 0 - 5 RBC/hpf   Bacteria, UA MANY (A) NONE SEEN   Ct Abdomen Pelvis W Contrast  Result Date: 09/20/2015 CLINICAL DATA:  Left-sided abdominal pain and nausea for 4 hours. History of hypertension, hysterectomy, back surgery. EXAM: CT ABDOMEN AND PELVIS WITH CONTRAST TECHNIQUE: Multidetector CT imaging of the abdomen and pelvis was performed using the standard protocol following bolus administration of intravenous contrast. CONTRAST:  21mL ISOVUE-300 IOPAMIDOL (ISOVUE-300) INJECTION 61% COMPARISON:  04/29/2013 FINDINGS: Lower chest: There is atelectasis at both lung bases. Coronary artery calcifications are present. Hepatobiliary: Small hepatic cysts are present. There is mild intrahepatic biliary duct dilatation. Common bile duct is 7 mm. Appearance is stable. Pancreas: Mildly dilated pancreatic duct.  No focal mass. Spleen: Normal appearance. Renal/Adrenal: Adrenal glands are normal in appearance. Numerous left renal cysts are present. There is  perinephric stranding on the left, new since prior studies, suspicious for inflammation, infection, or obstruction. Appearance of the dilated left collecting system is stable, consistent extrarenal pelvis. The left ureter is normal in appearance and no ureteral stones are identified. Stable appearance of phleboliths at the region of the left ureterovesical junction. The right kidney is normal in appearance. Left ureter is also normal in appearance. Gastrointestinal tract: The stomach and small bowel loops are normal in appearance. The appendix is well seen and has a normal appearance. Colonic loops are normal in appearance. Moderate stool burden. Reproductive/Pelvis: The uterus is absent. No adnexal mass. No free pelvic fluid. Vascular/Lymphatic: Mild atherosclerotic calcification of the abdominal aorta. No retroperitoneal or mesenteric adenopathy. Musculoskeletal/Abdominal wall: Stable superior endplate  fractures of 624THL, L1, and L3. Significant degenerative changes throughout the lower thoracic and lumbar spine. S shaped scoliosis of the thoracolumbar spine. Other: none IMPRESSION: 1. Perinephric changes on the left suggestive of inflammation or infection more than obstruction. Stable appearance of extrarenal pelvis on the left and no evidence for ureteral stones. 2. Moderate stool burden. 3. Stable appearance of numerous vertebral fractures. 4. Coronary artery disease. 5. Mildly dilated intra hepatic ducts and pancreatic duct, stable in appearance and consistent with benign etiology. Electronically Signed   By: Nolon Nations M.D.   On: 09/20/2015 16:23    CT scan revealing inflammation/infection on left kidney, no obstruction.  U/a appears infectious, will send for culture.  Recently completed course of keflex so will change to ciprofloxacin pending urine culture for coverage of potential pyelonephritis.  Given dose of IV abx here.  6:46 PM Patient did require additional dose of IV pain meds which caused some mild hypotension.  Patient is asymptomatic of this and continues to appear well.  Given small fluid bolus.   8:37 PM Pressures have remained stable after small fluid bolus.  IV abx finished.  Patient feeling better.  Tolerating food and fluids without difficulty.  Will d/c home on cipro pending urine culture.  Small supply pain meds and zofran given for symptomatic control.  Advised to use these with caution.  Recommended over the counter stool softener if develop issues with constipation. Follow-up with her primary care doctor recommended.  Discussed plan with patient and family at bedside, they acknowledged understanding and agreed with plan of care.  Return precautions given for new or worsening symptoms.   Larene Pickett, PA-C 09/20/15 2201    Leonard Schwartz, MD 09/22/15 1346

## 2015-09-23 ENCOUNTER — Encounter (HOSPITAL_COMMUNITY): Payer: Self-pay | Admitting: Emergency Medicine

## 2015-09-23 DIAGNOSIS — I2510 Atherosclerotic heart disease of native coronary artery without angina pectoris: Secondary | ICD-10-CM | POA: Diagnosis not present

## 2015-09-23 DIAGNOSIS — Z7982 Long term (current) use of aspirin: Secondary | ICD-10-CM | POA: Insufficient documentation

## 2015-09-23 DIAGNOSIS — N12 Tubulo-interstitial nephritis, not specified as acute or chronic: Secondary | ICD-10-CM | POA: Insufficient documentation

## 2015-09-23 DIAGNOSIS — Z79899 Other long term (current) drug therapy: Secondary | ICD-10-CM | POA: Diagnosis not present

## 2015-09-23 DIAGNOSIS — R1032 Left lower quadrant pain: Secondary | ICD-10-CM | POA: Diagnosis not present

## 2015-09-23 DIAGNOSIS — I10 Essential (primary) hypertension: Secondary | ICD-10-CM | POA: Diagnosis not present

## 2015-09-23 DIAGNOSIS — N390 Urinary tract infection, site not specified: Secondary | ICD-10-CM | POA: Diagnosis not present

## 2015-09-23 DIAGNOSIS — I252 Old myocardial infarction: Secondary | ICD-10-CM | POA: Diagnosis not present

## 2015-09-23 DIAGNOSIS — N1330 Unspecified hydronephrosis: Secondary | ICD-10-CM | POA: Diagnosis not present

## 2015-09-23 LAB — URINALYSIS, ROUTINE W REFLEX MICROSCOPIC
BILIRUBIN URINE: NEGATIVE
Glucose, UA: NEGATIVE mg/dL
HGB URINE DIPSTICK: NEGATIVE
Ketones, ur: NEGATIVE mg/dL
Nitrite: NEGATIVE
PH: 5.5 (ref 5.0–8.0)
Protein, ur: NEGATIVE mg/dL
SPECIFIC GRAVITY, URINE: 1.011 (ref 1.005–1.030)

## 2015-09-23 LAB — URINE CULTURE

## 2015-09-23 LAB — URINE MICROSCOPIC-ADD ON

## 2015-09-23 NOTE — ED Triage Notes (Signed)
Pt states she was diagnosed with a UTI a couple weeks ago and was given an antibiotic  Pt states she took that without any improvement so last Monday she went to [facility] and was given IV antibiotics and IV pain medication and was sent home with percocet after having a scan  Pt states she continues to have left flank and back pain

## 2015-09-24 ENCOUNTER — Emergency Department (HOSPITAL_COMMUNITY)
Admission: EM | Admit: 2015-09-24 | Discharge: 2015-09-24 | Disposition: A | Discharge status: Home / Self Care | Payer: PPO | Attending: Emergency Medicine | Admitting: Emergency Medicine

## 2015-09-24 ENCOUNTER — Emergency Department (HOSPITAL_COMMUNITY): Payer: PPO

## 2015-09-24 ENCOUNTER — Telehealth (HOSPITAL_BASED_OUTPATIENT_CLINIC_OR_DEPARTMENT_OTHER): Payer: Self-pay | Admitting: Emergency Medicine

## 2015-09-24 DIAGNOSIS — N10 Acute tubulo-interstitial nephritis: Secondary | ICD-10-CM | POA: Diagnosis not present

## 2015-09-24 DIAGNOSIS — N1330 Unspecified hydronephrosis: Secondary | ICD-10-CM | POA: Diagnosis not present

## 2015-09-24 DIAGNOSIS — R109 Unspecified abdominal pain: Secondary | ICD-10-CM

## 2015-09-24 DIAGNOSIS — N12 Tubulo-interstitial nephritis, not specified as acute or chronic: Secondary | ICD-10-CM

## 2015-09-24 DIAGNOSIS — N312 Flaccid neuropathic bladder, not elsewhere classified: Secondary | ICD-10-CM | POA: Diagnosis not present

## 2015-09-24 LAB — COMPREHENSIVE METABOLIC PANEL
ALBUMIN: 3.9 g/dL (ref 3.5–5.0)
ALK PHOS: 56 U/L (ref 38–126)
ALT: 40 U/L (ref 14–54)
AST: 52 U/L — AB (ref 15–41)
Anion gap: 7 (ref 5–15)
BILIRUBIN TOTAL: 0.4 mg/dL (ref 0.3–1.2)
BUN: 17 mg/dL (ref 6–20)
CALCIUM: 8.8 mg/dL — AB (ref 8.9–10.3)
CO2: 21 mmol/L — ABNORMAL LOW (ref 22–32)
CREATININE: 1.32 mg/dL — AB (ref 0.44–1.00)
Chloride: 102 mmol/L (ref 101–111)
GFR calc Af Amer: 43 mL/min — ABNORMAL LOW (ref >60)
GFR, EST NON AFRICAN AMERICAN: 37 mL/min — AB (ref >60)
GLUCOSE: 113 mg/dL — AB (ref 65–99)
POTASSIUM: 3.9 mmol/L (ref 3.5–5.1)
Sodium: 130 mmol/L — ABNORMAL LOW (ref 135–145)
TOTAL PROTEIN: 6.9 g/dL (ref 6.5–8.1)

## 2015-09-24 LAB — CBC WITH DIFFERENTIAL/PLATELET
BASOS ABS: 0 10*3/uL (ref 0.0–0.1)
BASOS PCT: 1 %
Eosinophils Absolute: 0 10*3/uL (ref 0.0–0.7)
Eosinophils Relative: 1 %
HEMATOCRIT: 36.5 % (ref 36.0–46.0)
HEMOGLOBIN: 12.6 g/dL (ref 12.0–15.0)
LYMPHS PCT: 20 %
Lymphs Abs: 1.7 10*3/uL (ref 0.7–4.0)
MCH: 31.4 pg (ref 26.0–34.0)
MCHC: 34.5 g/dL (ref 30.0–36.0)
MCV: 91 fL (ref 78.0–100.0)
MONOS PCT: 10 %
Monocytes Absolute: 0.9 10*3/uL (ref 0.1–1.0)
NEUTROS ABS: 6 10*3/uL (ref 1.7–7.7)
NEUTROS PCT: 68 %
Platelets: 150 10*3/uL (ref 150–400)
RBC: 4.01 MIL/uL (ref 3.87–5.11)
RDW: 13.1 % (ref 11.5–15.5)
WBC: 8.6 10*3/uL (ref 4.0–10.5)

## 2015-09-24 LAB — I-STAT CG4 LACTIC ACID, ED: LACTIC ACID, VENOUS: 1.51 mmol/L (ref 0.5–1.9)

## 2015-09-24 MED ORDER — KETOROLAC TROMETHAMINE 15 MG/ML IJ SOLN
15.0000 mg | Freq: Once | INTRAMUSCULAR | Status: AC
  Administered 2015-09-24: 15 mg via INTRAVENOUS
  Filled 2015-09-24: qty 1

## 2015-09-24 MED ORDER — SODIUM CHLORIDE 0.9 % IV SOLN
Freq: Once | INTRAVENOUS | Status: AC
  Administered 2015-09-24: 01:00:00 via INTRAVENOUS

## 2015-09-24 MED ORDER — HYDROMORPHONE HCL 1 MG/ML IJ SOLN
0.5000 mg | Freq: Once | INTRAMUSCULAR | Status: AC
Start: 2015-09-24 — End: 2015-09-24
  Administered 2015-09-24: 0.5 mg via INTRAVENOUS
  Filled 2015-09-24: qty 1

## 2015-09-24 MED ORDER — HYDROMORPHONE HCL 2 MG PO TABS: 1.0000 mg | ORAL_TABLET | ORAL | 0 refills | Status: DC | PRN

## 2015-09-24 MED ORDER — ONDANSETRON HCL 4 MG/2ML IJ SOLN
4.0000 mg | Freq: Once | INTRAMUSCULAR | Status: AC
  Administered 2015-09-24: 4 mg via INTRAVENOUS
  Filled 2015-09-24: qty 2

## 2015-09-24 NOTE — Telephone Encounter (Signed)
Post ED Visit - Positive Culture Follow-up  Culture report reviewed by antimicrobial stewardship pharmacist:  []  Elenor Quinones, Pharm.D. []  Heide Guile, Pharm.D., BCPS []  Parks Neptune, Pharm.D. []  Alycia Rossetti, Pharm.D., BCPS []  [place], [place].D., BCPS, AAHIVP []  Legrand Como, Pharm.D., BCPS, AAHIVP []  Milus Glazier, Pharm.D. []  Stephens November, Pharm.D.  Andria Meuse RPh  Positive urine culture Treated with cephalexin and ciprofloxacin, organism sensitive to the same and no further patient follow-up is required at this time.  Hazle Nordmann 09/24/2015, 4:38 PM

## 2015-09-24 NOTE — ED Provider Notes (Signed)
[facility] DEPT Provider Note   CSN: [id number] Arrival date & time: 09/23/15  2035  By signing my name below, I, Higinio Plan, attest that this documentation has been prepared under the direction and in the presence of Orpah Greek, MD . Electronically Signed: Higinio Plan, Scribe. 09/24/2015. 12:27 AM.  History   Chief Complaint Chief Complaint  Patient presents with  . Flank Pain   The history is provided by the patient. No language interpreter was used.   HPI Comments: RENAYA HOLTHAUS is a 82 y.o. female with PMHx of UTI, HLD and HTN, who presents to the Emergency Department complaining of gradually worsening, left flank pain radiating into her lower back that began 4 days ago and worsened today. She notes associated LLQ abdominal pain. She reports she was seen at [facility] ED 4 days ago for similar symptoms in which she had a CT scan and was diagnosed with a kidney infection; she states she was prescribed percocet with no relief. She also notes hx of a UTI a few weeks ago.   Past Medical History:  Diagnosis Date  . Anxiety   . Bilateral bunions   . CAD in native artery    a. NSTEMI 2/14 => LHC 03/08/12: Mid LAD 95% (hazy-suggestive of a ruptured plaque), EF 40%, distal anterior, apical and distal inferior HK =>  PCI: Xience DES to the mid LAD.  [name] Cardiomyopathy ([place])    LVEF 45% on cath/echo 03/2012, suspected to be ischemic vs stress-induced  . Depression   . Diverticulosis   . Fracture 2013   Right foot fracture- did not require surgery  . Hyperlipidemia   . Hypertension   . Macular degeneration   . Macular degeneration   . Mild mitral regurgitation    a. Echo 03/09/12: Mild focal basal hypertrophy the septum, EF 45-50%, distal anteroseptal, anterolateral, inferolateral, inferoseptal and apical HK, grade 2 diastolic dysfunction, mild MR, PASP 52  . Osteoporosis   . Self-catheterizes urinary bladder   . UTI (lower urinary tract infection)     Patient Active Problem List   Diagnosis Date Noted  . History of MI (myocardial infarction) 07/08/2015  . Dynamic left ventricular outflow obstruction 07/08/2015  . Disturbance of skin sensation 02/19/2013  . Anxiety 03/12/2012  . Hyperlipidemia 03/12/2012  . Urinary retention 03/12/2012  . Essential hypertension 03/12/2012  . CAD (coronary artery disease), native coronary artery 03/12/2012  . Cardiomyopathy ([place]) 03/12/2012  . Mild pulmonary hypertension ([place]) 03/12/2012  . Mild mitral regurgitation 03/12/2012  . NSTEMI (non-ST elevated myocardial infarction) ([place]) 03/09/2012    Past Surgical History:  Procedure Laterality Date  . ABDOMINAL HYSTERECTOMY  1980  . BACK SURGERY  1986  . CARPAL TUNNEL RELEASE Right MAy 2015  . CORONARY ANGIOPLASTY WITH STENT PLACEMENT  03/11/12   95% mid LAD stenosis s/p DES, minimal nonobs dz elsewhere; LVEF 40%, moderate hypokinesis of distal anterior, apical and distal inferior walls, mildly elevated filling pressures, mild pulmonary HTN  . LEFT HEART CATHETERIZATION WITH CORONARY ANGIOGRAM N/A 03/11/2012   Procedure: LEFT HEART CATHETERIZATION WITH CORONARY ANGIOGRAM;  Surgeon: Wellington Hampshire, MD;  Location: [facility]ATH [facility];  Service: Cardiovascular;  Laterality: N/A;  . reclast  2013    OB History    No data available     Home Medications    Prior to Admission medications   Medication Sig Start Date End Date Taking? Authorizing Provider  ALPRAZolam Duanne Moron) 0.5 MG tablet Take 1 tablet by mouth daily. 07/15/15  Historical Provider, MD  aspirin 81 MG tablet Take 81 mg by mouth daily.    Historical Provider, MD  atorvastatin (LIPITOR) 80 MG tablet Take 1 tablet (80 mg total) by mouth daily. 11/03/14   Minus Breeding, MD  cephALEXin (KEFLEX) 500 MG capsule Take 500 mg by mouth 4 (four) times daily. For 5 days. Started on 09-08-15    Historical Provider, MD  cholecalciferol (VITAMIN D) 1000 UNITS tablet Take 1,000 Units by mouth daily.     Historical Provider, MD  ciprofloxacin  (CIPRO) 500 MG tablet Take 1 tablet (500 mg total) by mouth 2 (two) times daily. 09/20/15   Larene Pickett, PA-C  Cranberry (ELLURA PO) Take 1 tablet by mouth daily.     Historical Provider, MD  lisinopril (PRINIVIL,ZESTRIL) 5 MG tablet Take 1 tablet (5 mg total) by mouth every morning. 11/03/14   Minus Breeding, MD  metoprolol tartrate (LOPRESSOR) 25 MG tablet Take 1 tablet (25 mg total) by mouth 2 (two) times daily. 11/03/14   Minus Breeding, MD  Multiple Vitamins-Minerals (ICAPS) CAPS Take 1 capsule by mouth every morning.    Historical Provider, MD  nitroGLYCERIN (NITROSTAT) 0.4 MG SL tablet Place 1 tablet (0.4 mg total) under the tongue every 5 (five) minutes x 3 doses as needed for chest pain. 07/14/14   Minus Breeding, MD  Omega-3 Fatty Acids (FISH OIL) 1000 MG CAPS Take 1 capsule by mouth daily.    Historical Provider, MD  ondansetron (ZOFRAN ODT) 4 MG disintegrating tablet Take 1 tablet (4 mg total) by mouth every 8 (eight) hours as needed for nausea. 09/20/15   Larene Pickett, PA-C  oxyCODONE-acetaminophen (PERCOCET/ROXICET) 5-325 MG tablet Take 1 tablet by mouth every 4 (four) hours as needed. 09/20/15   Larene Pickett, PA-C  zoledronic acid (RECLAST) 5 MG/100ML SOLN injection Inject 5 mg into the vein once. Next dose due in July 2017    Historical Provider, MD    Family History Family History  Problem Relation Age of Onset  . Colon cancer Father   . Hypertension Mother   . CAD Mother   . Heart attack Mother   . CAD Brother 80    CABG  . Cancer - Lung Brother   . Cancer - Lung Sister   . Hypertension Paternal Grandmother   . Breast cancer Paternal Grandfather     Social History Social History  Substance Use Topics  . Smoking status: Never Smoker  . Smokeless tobacco: Never Used  . Alcohol use Yes     Comment: 4 glasses of wine per week     Allergies   Estrogens; Pneumococcal vaccines; Bee venom; Lexapro [escitalopram oxalate]; and Sulfa antibiotics   Review of  Systems Review of Systems  Gastrointestinal: Positive for abdominal pain.  Genitourinary: Positive for flank pain.  Musculoskeletal: Positive for back pain.   Physical Exam Updated Vital Signs BP 165/97 (BP Location: Left Arm)   Pulse 68   Temp 97.6 F (36.4 C) (Oral)   Resp 18   SpO2 98%   Physical Exam  Constitutional: She is oriented to person, place, and time. She appears well-developed and well-nourished. No distress.  HENT:  Head: Normocephalic and atraumatic.  Right Ear: Hearing normal.  Left Ear: Hearing normal.  Nose: Nose normal.  Mouth/Throat: Oropharynx is clear and moist and mucous membranes are normal.  Eyes: Conjunctivae and EOM are normal. Pupils are equal, round, and reactive to light.  Neck: Normal range of motion. Neck supple.  Cardiovascular:  Regular rhythm, S1 normal and S2 normal.  Exam reveals no gallop and no friction rub.   No murmur heard. Pulmonary/Chest: Effort normal and breath sounds normal. No respiratory distress. She exhibits no tenderness.  Abdominal: Soft. Normal appearance and bowel sounds are normal. There is no hepatosplenomegaly. There is tenderness. There is no rebound, no guarding, no tenderness at McBurney's point and negative Murphy's sign. No hernia.  Left CVA tenderness  LLQ tenderness without rebound or guarding   Musculoskeletal: Normal range of motion.  Neurological: She is alert and oriented to person, place, and time. She has normal strength. No cranial nerve deficit or sensory deficit. Coordination normal. GCS eye subscore is 4. GCS verbal subscore is 5. GCS motor subscore is 6.  Skin: Skin is warm, dry and intact. No rash noted. No cyanosis.  Psychiatric: She has a normal mood and affect. Her speech is normal and behavior is normal. Thought content normal.  Nursing note and vitals reviewed.  ED Treatments / Results  Labs (all labs ordered are listed, but only abnormal results are displayed) Labs Reviewed  URINALYSIS, ROUTINE  W REFLEX MICROSCOPIC (NOT AT [facility]) - Abnormal; Notable for the following:       Result Value   Leukocytes, UA TRACE (*)    All other components within normal limits  URINE MICROSCOPIC-ADD ON - Abnormal; Notable for the following:    Squamous Epithelial / LPF 0-5 (*)    Bacteria, UA FEW (*)    All other components within normal limits  COMPREHENSIVE METABOLIC PANEL - Abnormal; Notable for the following:    Sodium 130 (*)    CO2 21 (*)    Glucose, Bld 113 (*)    Creatinine, Ser 1.32 (*)    Calcium 8.8 (*)    AST 52 (*)    GFR calc non Af Amer 37 (*)    GFR calc Af Amer 43 (*)    All other components within normal limits  CBC WITH DIFFERENTIAL/PLATELET  I-STAT CG4 LACTIC ACID, ED    EKG  EKG Interpretation None       Radiology US Renal  Result Date: 09/24/2015 CLINICAL DATA:  Left flank pain for 4 days EXAM: RENAL / URINARY TRACT ULTRASOUND COMPLETE COMPARISON:  09/20/2015 abdominal CT FINDINGS: Right Kidney: Length: 11.5 cm. Echogenicity within normal limits. No mass or hydronephrosis visualized. Left Kidney: Length: 12 cm. Moderate left hydronephrosis that is stable to progressed from previous CT. No visible obstructive process. Simple 26 mm cyst in the cortex. Bladder: Appears normal for degree of bladder distention. A left ureteral jet was not seen, during which time 5 right ureteral jets were noted. IMPRESSION: Moderate left hydronephrosis, borderline increased from recent CT. No left ureteral jet visualized. Electronically Signed   By: Monte Fantasia M.D.   On: 09/24/2015 02:39    Procedures Procedures  DIAGNOSTIC STUDIES:  Oxygen Saturation is 98% on RA, normal by my interpretation.    COORDINATION OF CARE:  12:26 AM Discussed treatment plan with pt at bedside and pt agreed to plan.  Medications Ordered in ED Medications  ketorolac (TORADOL) 15 MG/ML injection 15 mg (not administered)  0.9 %  sodium chloride infusion ( Intravenous New Bag/Given 09/24/15 0045)   ondansetron (ZOFRAN) injection 4 mg (4 mg Intravenous Given 09/24/15 0047)  HYDROmorphone (DILAUDID) injection 0.5 mg (0.5 mg Intravenous Given 09/24/15 0048)   Initial Impression / Assessment and Plan / ED Course  I have reviewed the triage vital signs and the nursing notes.  Pertinent labs & imaging results that were available during my care of the patient were reviewed by me and considered in my medical decision making (see chart for details).  Clinical Course    Patient presents with complaints of left flank pain. Patient was seen in the ER 4 days ago with similar. At that time she was diagnosed with pyelonephritis. She had a CT scan that showed stranding that was consistent with infection but there was some fullness to the collecting system. No kidney stone was noted. Patient was treated with antibiotics. Subsequent culture grew Escherichia coli which was sensitive to both the Cipro and Rocephin she has received. Her urinalysis appears improved today.  Did have moderate to severe left flank pain on arrival. This significantly improved after a single dose of Dilaudid. Ultrasound was performed to further evaluate for hydronephrosis. She does have some increased of her neck hydronephrosis in comparison to previous CT. Findings were discussed with Dr. Noah Delaine, on call for urology. He was able to access the patient's images and reviewed the previous CAT scan and today's ultrasound. He did agree that there was some UPJ obstruction which he felt was likely secondary to the infection. He did not feel the patient required hospitalization at this time, but rather to be followed up in the office. Patient was given a small dose of IV Toradol before discharge. She has not achieving good pain control with Percocet, will be prescribed by mouth Dilaudid, is to contact urology office today for recheck.  I personally performed the services described in this documentation, which was scribed in my presence. The  recorded information has been reviewed and is accurate.   Final Clinical Impressions(s) / ED Diagnoses   Final diagnoses:  None    New Prescriptions New Prescriptions   No medications on file     Orpah Greek, MD 09/24/15 [phone]

## 2015-09-28 DIAGNOSIS — N1330 Unspecified hydronephrosis: Secondary | ICD-10-CM | POA: Diagnosis not present

## 2015-09-28 DIAGNOSIS — N312 Flaccid neuropathic bladder, not elsewhere classified: Secondary | ICD-10-CM | POA: Diagnosis not present

## 2015-10-08 DIAGNOSIS — R339 Retention of urine, unspecified: Secondary | ICD-10-CM | POA: Diagnosis not present

## 2015-10-28 DIAGNOSIS — H353221 Exudative age-related macular degeneration, left eye, with active choroidal neovascularization: Secondary | ICD-10-CM | POA: Diagnosis not present

## 2015-11-05 DIAGNOSIS — Z23 Encounter for immunization: Secondary | ICD-10-CM | POA: Diagnosis not present

## 2015-11-08 DIAGNOSIS — R339 Retention of urine, unspecified: Secondary | ICD-10-CM | POA: Diagnosis not present

## 2015-11-16 ENCOUNTER — Other Ambulatory Visit: Payer: Self-pay | Admitting: Cardiology

## 2015-11-22 ENCOUNTER — Encounter: Payer: Self-pay | Admitting: Cardiology

## 2015-12-02 DIAGNOSIS — H353221 Exudative age-related macular degeneration, left eye, with active choroidal neovascularization: Secondary | ICD-10-CM | POA: Diagnosis not present

## 2015-12-02 NOTE — Progress Notes (Signed)
HPI The patient presents for evaluation of CAD.  She had PCI of an LAD ruptured plaque in 2014. At that time she presented with jaw pain.  She was recently in the hospital with chest pain.  I reviewed these records.  This was atypical and not felt to be anginal.  She had an echo which showed dynamic outflow obstruction with Valsalva with a peak gradient of 13 mm Hg.   I sent her for a POET (Plain Old Exercise Treadmill) which was negative.  Since that time she has done well. She does water Zumba 3 hours per week.  The patient denies any new symptoms such as chest discomfort, neck or arm discomfort. There has been no new shortness of breath, PND or orthopnea. There have been no reported palpitations, presyncope or syncope.  Allergies  Allergen Reactions  . Estrogens     Breast pain  . Pneumococcal Vaccines     rash  . Bee Venom Rash  . Lexapro [Escitalopram Oxalate] Rash    edema  . Sulfa Antibiotics Rash    Current Outpatient Prescriptions  Medication Sig Dispense Refill  . ALPRAZolam (XANAX) 0.5 MG tablet Take 1 tablet by mouth daily.    [name] aspirin 81 MG tablet Take 81 mg by mouth daily.    [name] atorvastatin (LIPITOR) 80 MG tablet TAKE 1 TABLET (80 MG TOTAL) BY MOUTH DAILY. 90 tablet 3  . cholecalciferol (VITAMIN D) 1000 UNITS tablet Take 1,000 Units by mouth daily.     . Cranberry (ELLURA PO) Take 1 tablet by mouth daily.     [name] HYDROmorphone (DILAUDID) 2 MG tablet Take 0.5 tablets (1 mg total) by mouth every 4 (four) hours as needed for severe pain. 20 tablet 0  . lisinopril (PRINIVIL,ZESTRIL) 5 MG tablet TAKE 1 TABLET (5 MG TOTAL) BY MOUTH EVERY MORNING. 90 tablet 3  . metoprolol tartrate (LOPRESSOR) 25 MG tablet Take 1 tablet (25 mg total) by mouth 2 (two) times daily. 180 tablet 3  . Multiple Vitamins-Minerals (ICAPS) CAPS Take 1 capsule by mouth every morning.    . nitroGLYCERIN (NITROSTAT) 0.4 MG SL tablet Place 1 tablet (0.4 mg total) under the tongue every 5 (five) minutes x 3  doses as needed for chest pain. 25 tablet 3  . Omega-3 Fatty Acids (FISH OIL) 1000 MG CAPS Take 1 capsule by mouth daily.    . ondansetron (ZOFRAN ODT) 4 MG disintegrating tablet Take 1 tablet (4 mg total) by mouth every 8 (eight) hours as needed for nausea. 10 tablet 0  . oxyCODONE-acetaminophen (PERCOCET/ROXICET) 5-325 MG tablet Take 1 tablet by mouth every 4 (four) hours as needed. 15 tablet 0  . zoledronic acid (RECLAST) 5 MG/100ML SOLN injection Inject 5 mg into the vein once. Next dose due in July 2017     No current facility-administered medications for this visit.     Past Medical History:  Diagnosis Date  . Anxiety   . Bilateral bunions   . CAD in native artery    a. NSTEMI 2/14 => LHC 03/08/12: Mid LAD 95% (hazy-suggestive of a ruptured plaque), EF 40%, distal anterior, apical and distal inferior HK =>  PCI: Xience DES to the mid LAD.  [name] Cardiomyopathy ([place])    LVEF 45% on cath/echo 03/2012, suspected to be ischemic vs stress-induced  . Depression   . Diverticulosis   . Fracture 2013   Right foot fracture- did not require surgery  . Hyperlipidemia   . Hypertension   . Macular  degeneration   . Macular degeneration   . Mild mitral regurgitation    a. Echo 03/09/12: Mild focal basal hypertrophy the septum, EF 45-50%, distal anteroseptal, anterolateral, inferolateral, inferoseptal and apical HK, grade 2 diastolic dysfunction, mild MR, PASP 52  . Osteoporosis   . Self-catheterizes urinary bladder   . UTI (lower urinary tract infection)     Past Surgical History:  Procedure Laterality Date  . ABDOMINAL HYSTERECTOMY  1980  . BACK SURGERY  1986  . CARPAL TUNNEL RELEASE Right MAy 2015  . CORONARY ANGIOPLASTY WITH STENT PLACEMENT  03/11/12   95% mid LAD stenosis s/p DES, minimal nonobs dz elsewhere; LVEF 40%, moderate hypokinesis of distal anterior, apical and distal inferior walls, mildly elevated filling pressures, mild pulmonary HTN  . LEFT HEART CATHETERIZATION WITH CORONARY  ANGIOGRAM N/A 03/11/2012   Procedure: LEFT HEART CATHETERIZATION WITH CORONARY ANGIOGRAM;  Surgeon: Wellington Hampshire, MD;  Location: [facility];  Service: Cardiovascular;  Laterality: N/A;  . reclast  2013    ROS:    As stated in the HPI and negative for all other systems.  PHYSICAL EXAM BP 122/70   Pulse 83   Ht 5\' 6"  (1.676 m)   Wt 157 lb (71.2 kg)   BMI 25.34 kg/m  GENERAL:  Well appearing NECK:  No jugular venous distention, waveform within normal limits, carotid upstroke brisk and symmetric, no bruits, no thyromegaly LYMPHATICS:  No cervical, inguinal adenopathy LUNGS:  Clear to auscultation bilaterally CHEST:  Unremarkable HEART:  PMI not displaced or sustained,S1 and S2 within normal limits, no S3, no S4, no clicks, no rubs, 2/6 apical systolic murmur, no radiation. ABD:  Flat, positive bowel sounds normal in frequency in pitch, no bruits, no rebound, no guarding, no midline pulsatile mass, no hepatomegaly, no splenomegaly EXT:  2 plus pulses throughout, no edema, no cyanosis no clubbing   ASSESSMENT AND PLAN  CHEST PAIN:  She had a negative POET (Plain Old Exercise Treadmill in June.  She has had no symptoms since then. .  No change in therapy or further testing is indicated.   CAD:  As above   HTN:  The blood pressure is at target. No change in medications is indicated. We will continue with therapeutic lifestyle changes (TLC).    HYPERLIPIDEMIA:     She needs to come back for a fasting lipid profile.

## 2015-12-03 ENCOUNTER — Encounter: Payer: Self-pay | Admitting: Cardiology

## 2015-12-03 ENCOUNTER — Ambulatory Visit (INDEPENDENT_AMBULATORY_CARE_PROVIDER_SITE_OTHER): Payer: PPO | Admitting: Cardiology

## 2015-12-03 VITALS — BP 122/70 | HR 83 | Ht 66.0 in | Wt 157.0 lb

## 2015-12-03 DIAGNOSIS — E785 Hyperlipidemia, unspecified: Secondary | ICD-10-CM | POA: Diagnosis not present

## 2015-12-03 DIAGNOSIS — I2510 Atherosclerotic heart disease of native coronary artery without angina pectoris: Secondary | ICD-10-CM | POA: Diagnosis not present

## 2015-12-03 DIAGNOSIS — Z79899 Other long term (current) drug therapy: Secondary | ICD-10-CM | POA: Diagnosis not present

## 2015-12-03 NOTE — Patient Instructions (Addendum)
Medication Instructions:  Continue current medications  Labwork: Fasting Lipid Liver  Testing/Procedures: None Ordered  Follow-Up: Your physician wants you to follow-up in: 1 Year. You will receive a reminder letter in the mail two months in advance. If you don't receive a letter, please call our office to schedule the follow-up appointment.   Any Other Special Instructions Will Be Listed Below (If Applicable).   If you need a refill on your cardiac medications before your next appointment, please call your pharmacy.   

## 2015-12-10 DIAGNOSIS — N3020 Other chronic cystitis without hematuria: Secondary | ICD-10-CM | POA: Diagnosis not present

## 2015-12-13 DIAGNOSIS — R339 Retention of urine, unspecified: Secondary | ICD-10-CM | POA: Diagnosis not present

## 2015-12-29 ENCOUNTER — Other Ambulatory Visit: Payer: Self-pay | Admitting: Cardiology

## 2016-01-06 DIAGNOSIS — H353221 Exudative age-related macular degeneration, left eye, with active choroidal neovascularization: Secondary | ICD-10-CM | POA: Diagnosis not present

## 2016-01-11 DIAGNOSIS — R339 Retention of urine, unspecified: Secondary | ICD-10-CM | POA: Diagnosis not present

## 2016-01-13 DIAGNOSIS — R339 Retention of urine, unspecified: Secondary | ICD-10-CM | POA: Diagnosis not present

## 2016-01-27 DIAGNOSIS — R195 Other fecal abnormalities: Secondary | ICD-10-CM | POA: Diagnosis not present

## 2016-01-27 DIAGNOSIS — R1012 Left upper quadrant pain: Secondary | ICD-10-CM | POA: Diagnosis not present

## 2016-01-31 HISTORY — PX: KNEE ARTHROSCOPY: SHX127

## 2016-02-09 DIAGNOSIS — R339 Retention of urine, unspecified: Secondary | ICD-10-CM | POA: Diagnosis not present

## 2016-02-10 DIAGNOSIS — H353221 Exudative age-related macular degeneration, left eye, with active choroidal neovascularization: Secondary | ICD-10-CM | POA: Diagnosis not present

## 2016-03-02 DIAGNOSIS — Z86010 Personal history of colonic polyps: Secondary | ICD-10-CM | POA: Diagnosis not present

## 2016-03-02 DIAGNOSIS — Z800 Family history of malignant neoplasm of digestive organs: Secondary | ICD-10-CM | POA: Diagnosis not present

## 2016-03-02 DIAGNOSIS — R198 Other specified symptoms and signs involving the digestive system and abdomen: Secondary | ICD-10-CM | POA: Diagnosis not present

## 2016-03-16 DIAGNOSIS — R339 Retention of urine, unspecified: Secondary | ICD-10-CM | POA: Diagnosis not present

## 2016-03-16 DIAGNOSIS — H353221 Exudative age-related macular degeneration, left eye, with active choroidal neovascularization: Secondary | ICD-10-CM | POA: Diagnosis not present

## 2016-04-12 DIAGNOSIS — R339 Retention of urine, unspecified: Secondary | ICD-10-CM | POA: Diagnosis not present

## 2016-04-17 DIAGNOSIS — D1801 Hemangioma of skin and subcutaneous tissue: Secondary | ICD-10-CM | POA: Diagnosis not present

## 2016-04-17 DIAGNOSIS — L304 Erythema intertrigo: Secondary | ICD-10-CM | POA: Diagnosis not present

## 2016-04-17 DIAGNOSIS — L821 Other seborrheic keratosis: Secondary | ICD-10-CM | POA: Diagnosis not present

## 2016-04-17 DIAGNOSIS — L570 Actinic keratosis: Secondary | ICD-10-CM | POA: Diagnosis not present

## 2016-04-17 DIAGNOSIS — D692 Other nonthrombocytopenic purpura: Secondary | ICD-10-CM | POA: Diagnosis not present

## 2016-04-19 DIAGNOSIS — H353221 Exudative age-related macular degeneration, left eye, with active choroidal neovascularization: Secondary | ICD-10-CM | POA: Diagnosis not present

## 2016-05-15 DIAGNOSIS — R3915 Urgency of urination: Secondary | ICD-10-CM | POA: Diagnosis not present

## 2016-05-15 DIAGNOSIS — R339 Retention of urine, unspecified: Secondary | ICD-10-CM | POA: Diagnosis not present

## 2016-05-18 DIAGNOSIS — H353221 Exudative age-related macular degeneration, left eye, with active choroidal neovascularization: Secondary | ICD-10-CM | POA: Diagnosis not present

## 2016-05-22 DIAGNOSIS — R3914 Feeling of incomplete bladder emptying: Secondary | ICD-10-CM | POA: Diagnosis not present

## 2016-05-22 DIAGNOSIS — N3020 Other chronic cystitis without hematuria: Secondary | ICD-10-CM | POA: Diagnosis not present

## 2016-05-22 DIAGNOSIS — N952 Postmenopausal atrophic vaginitis: Secondary | ICD-10-CM | POA: Diagnosis not present

## 2016-05-22 DIAGNOSIS — N8111 Cystocele, midline: Secondary | ICD-10-CM | POA: Diagnosis not present

## 2016-05-22 DIAGNOSIS — N312 Flaccid neuropathic bladder, not elsewhere classified: Secondary | ICD-10-CM | POA: Diagnosis not present

## 2016-05-29 ENCOUNTER — Other Ambulatory Visit: Payer: Self-pay | Admitting: Cardiology

## 2016-05-29 NOTE — Telephone Encounter (Signed)
Rx has been sent to the pharmacy electronically. ° °

## 2016-06-14 DIAGNOSIS — R339 Retention of urine, unspecified: Secondary | ICD-10-CM | POA: Diagnosis not present

## 2016-06-20 DIAGNOSIS — H353221 Exudative age-related macular degeneration, left eye, with active choroidal neovascularization: Secondary | ICD-10-CM | POA: Diagnosis not present

## 2016-06-20 DIAGNOSIS — H353132 Nonexudative age-related macular degeneration, bilateral, intermediate dry stage: Secondary | ICD-10-CM | POA: Diagnosis not present

## 2016-06-20 DIAGNOSIS — H35052 Retinal neovascularization, unspecified, left eye: Secondary | ICD-10-CM | POA: Diagnosis not present

## 2016-07-10 DIAGNOSIS — M25561 Pain in right knee: Secondary | ICD-10-CM | POA: Diagnosis not present

## 2016-07-12 DIAGNOSIS — S1086XA Insect bite of other specified part of neck, initial encounter: Secondary | ICD-10-CM | POA: Diagnosis not present

## 2016-07-12 DIAGNOSIS — R21 Rash and other nonspecific skin eruption: Secondary | ICD-10-CM | POA: Diagnosis not present

## 2016-07-12 DIAGNOSIS — W57XXXA Bitten or stung by nonvenomous insect and other nonvenomous arthropods, initial encounter: Secondary | ICD-10-CM | POA: Diagnosis not present

## 2016-07-21 ENCOUNTER — Telehealth: Payer: Self-pay | Admitting: Cardiology

## 2016-07-21 NOTE — Telephone Encounter (Signed)
Pt had stent put in 2014,she needs to have a MRI.Is it alright for her to have the MRI?

## 2016-07-21 NOTE — Telephone Encounter (Signed)
Pt.notified

## 2016-07-21 NOTE — Telephone Encounter (Signed)
Yes

## 2016-07-25 DIAGNOSIS — R339 Retention of urine, unspecified: Secondary | ICD-10-CM | POA: Diagnosis not present

## 2016-07-25 DIAGNOSIS — M25561 Pain in right knee: Secondary | ICD-10-CM | POA: Diagnosis not present

## 2016-07-27 DIAGNOSIS — H353132 Nonexudative age-related macular degeneration, bilateral, intermediate dry stage: Secondary | ICD-10-CM | POA: Diagnosis not present

## 2016-07-27 DIAGNOSIS — H353221 Exudative age-related macular degeneration, left eye, with active choroidal neovascularization: Secondary | ICD-10-CM | POA: Diagnosis not present

## 2016-07-28 DIAGNOSIS — S83241A Other tear of medial meniscus, current injury, right knee, initial encounter: Secondary | ICD-10-CM | POA: Diagnosis not present

## 2016-07-28 DIAGNOSIS — M25561 Pain in right knee: Secondary | ICD-10-CM | POA: Diagnosis not present

## 2016-07-28 DIAGNOSIS — S83281A Other tear of lateral meniscus, current injury, right knee, initial encounter: Secondary | ICD-10-CM | POA: Diagnosis not present

## 2016-07-28 DIAGNOSIS — R262 Difficulty in walking, not elsewhere classified: Secondary | ICD-10-CM | POA: Diagnosis not present

## 2016-07-30 DIAGNOSIS — S83206A Unspecified tear of unspecified meniscus, current injury, right knee, initial encounter: Secondary | ICD-10-CM

## 2016-07-30 HISTORY — DX: Unspecified tear of unspecified meniscus, current injury, right knee, initial encounter: S83.206A

## 2016-08-09 DIAGNOSIS — M25561 Pain in right knee: Secondary | ICD-10-CM | POA: Diagnosis not present

## 2016-08-09 DIAGNOSIS — R262 Difficulty in walking, not elsewhere classified: Secondary | ICD-10-CM | POA: Diagnosis not present

## 2016-08-11 DIAGNOSIS — M25561 Pain in right knee: Secondary | ICD-10-CM | POA: Diagnosis not present

## 2016-08-11 DIAGNOSIS — R262 Difficulty in walking, not elsewhere classified: Secondary | ICD-10-CM | POA: Diagnosis not present

## 2016-08-14 DIAGNOSIS — M810 Age-related osteoporosis without current pathological fracture: Secondary | ICD-10-CM | POA: Diagnosis not present

## 2016-08-15 DIAGNOSIS — M25561 Pain in right knee: Secondary | ICD-10-CM | POA: Diagnosis not present

## 2016-08-15 DIAGNOSIS — R262 Difficulty in walking, not elsewhere classified: Secondary | ICD-10-CM | POA: Diagnosis not present

## 2016-08-17 DIAGNOSIS — R262 Difficulty in walking, not elsewhere classified: Secondary | ICD-10-CM | POA: Diagnosis not present

## 2016-08-17 DIAGNOSIS — M25561 Pain in right knee: Secondary | ICD-10-CM | POA: Diagnosis not present

## 2016-08-21 DIAGNOSIS — Z1231 Encounter for screening mammogram for malignant neoplasm of breast: Secondary | ICD-10-CM | POA: Diagnosis not present

## 2016-08-21 DIAGNOSIS — M25561 Pain in right knee: Secondary | ICD-10-CM | POA: Diagnosis not present

## 2016-08-24 DIAGNOSIS — H353132 Nonexudative age-related macular degeneration, bilateral, intermediate dry stage: Secondary | ICD-10-CM | POA: Diagnosis not present

## 2016-08-24 DIAGNOSIS — H353221 Exudative age-related macular degeneration, left eye, with active choroidal neovascularization: Secondary | ICD-10-CM | POA: Diagnosis not present

## 2016-08-25 DIAGNOSIS — R339 Retention of urine, unspecified: Secondary | ICD-10-CM | POA: Diagnosis not present

## 2016-08-29 ENCOUNTER — Ambulatory Visit (HOSPITAL_COMMUNITY)
Admission: RE | Admit: 2016-08-29 | Discharge: 2016-08-29 | Disposition: A | Discharge status: Home / Self Care | Payer: PPO | Source: Ambulatory Visit | Attending: Family Medicine | Admitting: Family Medicine

## 2016-08-29 ENCOUNTER — Encounter (HOSPITAL_COMMUNITY): Payer: Self-pay

## 2016-08-29 DIAGNOSIS — M810 Age-related osteoporosis without current pathological fracture: Secondary | ICD-10-CM | POA: Diagnosis not present

## 2016-08-29 HISTORY — DX: Unspecified tear of unspecified meniscus, current injury, right knee, initial encounter: S83.206A

## 2016-08-29 MED ORDER — ZOLEDRONIC ACID 5 MG/100ML IV SOLN
5.0000 mg | Freq: Once | INTRAVENOUS | Status: AC
  Administered 2016-08-29: 5 mg via INTRAVENOUS
  Filled 2016-08-29: qty 100

## 2016-08-29 MED ORDER — SODIUM CHLORIDE 0.9 % IV SOLN
INTRAVENOUS | Status: DC
  Administered 2016-08-29: 09:00:00 via INTRAVENOUS

## 2016-08-29 NOTE — Discharge Instructions (Signed)
Zoledronic Acid injection (Paget's Disease, Osteoporosis)/ Reclast °What is this medicine? °ZOLEDRONIC ACID (ZOE le dron ik AS id) lowers the amount of calcium loss from bone. It is used to treat Paget's disease and osteoporosis in women. °This medicine may be used for other purposes; ask your health care provider or pharmacist if you have questions. °COMMON BRAND NAME(S): Reclast, Zometa °What should I tell my health care provider before I take this medicine? °They need to know if you have any of these conditions: °-aspirin-sensitive asthma °-cancer, especially if you are receiving medicines used to treat cancer °-dental disease or wear dentures °-infection °-kidney disease °-low levels of calcium in the blood °-past surgery on the parathyroid gland or intestines °-receiving corticosteroids like dexamethasone or prednisone °-an unusual or allergic reaction to zoledronic acid, other medicines, foods, dyes, or preservatives °-pregnant or trying to get pregnant °-breast-feeding °How should I use this medicine? °This medicine is for infusion into a vein. It is given by a health care professional in a hospital or clinic setting. °Talk to your pediatrician regarding the use of this medicine in children. This medicine is not approved for use in children. °Overdosage: If you think you have taken too much of this medicine contact a poison control center or emergency room at once. °NOTE: This medicine is only for you. Do not share this medicine with others. °What if I miss a dose? °It is important not to miss your dose. Call your doctor or health care professional if you are unable to keep an appointment. °What may interact with this medicine? °-certain antibiotics given by injection °-NSAIDs, medicines for pain and inflammation, like ibuprofen or naproxen °-some diuretics like bumetanide, furosemide °-teriparatide °This list may not describe all possible interactions. Give your health care provider a list of all the  medicines, herbs, non-prescription drugs, or dietary supplements you use. Also tell them if you smoke, drink alcohol, or use illegal drugs. Some items may interact with your medicine. °What should I watch for while using this medicine? °Visit your doctor or health care professional for regular checkups. It may be some time before you see the benefit from this medicine. Do not stop taking your medicine unless your doctor tells you to. Your doctor may order blood tests or other tests to see how you are doing. °Women should inform their doctor if they wish to become pregnant or think they might be pregnant. There is a potential for serious side effects to an unborn child. Talk to your health care professional or pharmacist for more information. °You should make sure that you get enough calcium and vitamin D while you are taking this medicine. Discuss the foods you eat and the vitamins you take with your health care professional. °Some people who take this medicine have severe bone, joint, and/or muscle pain. This medicine may also increase your risk for jaw problems or a broken thigh bone. Tell your doctor right away if you have severe pain in your jaw, bones, joints, or muscles. Tell your doctor if you have any pain that does not go away or that gets worse. °Tell your dentist and dental surgeon that you are taking this medicine. You should not have major dental surgery while on this medicine. See your dentist to have a dental exam and fix any dental problems before starting this medicine. Take good care of your teeth while on this medicine. Make sure you see your dentist for regular follow-up appointments. °What side effects may I notice from receiving this   medicine? °Side effects that you should report to your doctor or health care professional as soon as possible: °-allergic reactions like skin rash, itching or hives, swelling of the face, lips, or tongue °-anxiety, confusion, or depression °-breathing  problems °-changes in vision °-eye pain °-feeling faint or lightheaded, falls °-jaw pain, especially after dental work °-mouth sores °-muscle cramps, stiffness, or weakness °-redness, blistering, peeling or loosening of the skin, including inside the mouth °-trouble passing urine or change in the amount of urine °Side effects that usually do not require medical attention (report to your doctor or health care professional if they continue or are bothersome): °-bone, joint, or muscle pain °-constipation °-diarrhea °-fever °-hair loss °-irritation at site where injected °-loss of appetite °-nausea, vomiting °-stomach upset °-trouble sleeping °-trouble swallowing °-weak or tired °This list may not describe all possible side effects. Call your doctor for medical advice about side effects. You may report side effects to FDA at 1-800-FDA-1088. °Where should I keep my medicine? °This drug is given in a hospital or clinic and will not be stored at home. °NOTE: This sheet is a summary. It may not cover all possible information. If you have questions about this medicine, talk to your doctor, pharmacist, or health care provider. °© 2018 [employer] (2013-06-14 14:19:57) ° °

## 2016-09-04 DIAGNOSIS — R0602 Shortness of breath: Secondary | ICD-10-CM | POA: Diagnosis not present

## 2016-09-04 DIAGNOSIS — K219 Gastro-esophageal reflux disease without esophagitis: Secondary | ICD-10-CM | POA: Diagnosis not present

## 2016-09-08 DIAGNOSIS — M2241 Chondromalacia patellae, right knee: Secondary | ICD-10-CM | POA: Diagnosis not present

## 2016-09-08 DIAGNOSIS — S83271A Complex tear of lateral meniscus, current injury, right knee, initial encounter: Secondary | ICD-10-CM | POA: Diagnosis not present

## 2016-09-08 DIAGNOSIS — Y999 Unspecified external cause status: Secondary | ICD-10-CM | POA: Diagnosis not present

## 2016-09-08 DIAGNOSIS — G8918 Other acute postprocedural pain: Secondary | ICD-10-CM | POA: Diagnosis not present

## 2016-09-08 DIAGNOSIS — S83231A Complex tear of medial meniscus, current injury, right knee, initial encounter: Secondary | ICD-10-CM | POA: Diagnosis not present

## 2016-09-15 DIAGNOSIS — M2241 Chondromalacia patellae, right knee: Secondary | ICD-10-CM | POA: Diagnosis not present

## 2016-09-21 DIAGNOSIS — H353221 Exudative age-related macular degeneration, left eye, with active choroidal neovascularization: Secondary | ICD-10-CM | POA: Diagnosis not present

## 2016-09-21 DIAGNOSIS — H35052 Retinal neovascularization, unspecified, left eye: Secondary | ICD-10-CM | POA: Diagnosis not present

## 2016-09-21 DIAGNOSIS — H43811 Vitreous degeneration, right eye: Secondary | ICD-10-CM | POA: Diagnosis not present

## 2016-09-21 DIAGNOSIS — H353132 Nonexudative age-related macular degeneration, bilateral, intermediate dry stage: Secondary | ICD-10-CM | POA: Diagnosis not present

## 2016-09-26 DIAGNOSIS — R339 Retention of urine, unspecified: Secondary | ICD-10-CM | POA: Diagnosis not present

## 2016-09-27 DIAGNOSIS — Z9889 Other specified postprocedural states: Secondary | ICD-10-CM | POA: Diagnosis not present

## 2016-09-29 DIAGNOSIS — M25561 Pain in right knee: Secondary | ICD-10-CM | POA: Diagnosis not present

## 2016-10-09 DIAGNOSIS — M25561 Pain in right knee: Secondary | ICD-10-CM | POA: Diagnosis not present

## 2016-10-11 DIAGNOSIS — M25561 Pain in right knee: Secondary | ICD-10-CM | POA: Diagnosis not present

## 2016-10-16 DIAGNOSIS — M25561 Pain in right knee: Secondary | ICD-10-CM | POA: Diagnosis not present

## 2016-10-18 DIAGNOSIS — M25561 Pain in right knee: Secondary | ICD-10-CM | POA: Diagnosis not present

## 2016-10-19 DIAGNOSIS — H35052 Retinal neovascularization, unspecified, left eye: Secondary | ICD-10-CM | POA: Diagnosis not present

## 2016-10-19 DIAGNOSIS — H353221 Exudative age-related macular degeneration, left eye, with active choroidal neovascularization: Secondary | ICD-10-CM | POA: Diagnosis not present

## 2016-10-19 DIAGNOSIS — H4322 Crystalline deposits in vitreous body, left eye: Secondary | ICD-10-CM | POA: Diagnosis not present

## 2016-10-19 DIAGNOSIS — H353132 Nonexudative age-related macular degeneration, bilateral, intermediate dry stage: Secondary | ICD-10-CM | POA: Diagnosis not present

## 2016-10-23 DIAGNOSIS — M25561 Pain in right knee: Secondary | ICD-10-CM | POA: Diagnosis not present

## 2016-10-25 DIAGNOSIS — M25561 Pain in right knee: Secondary | ICD-10-CM | POA: Diagnosis not present

## 2016-10-25 DIAGNOSIS — Z9889 Other specified postprocedural states: Secondary | ICD-10-CM | POA: Diagnosis not present

## 2016-10-30 DIAGNOSIS — R339 Retention of urine, unspecified: Secondary | ICD-10-CM | POA: Diagnosis not present

## 2016-11-05 ENCOUNTER — Other Ambulatory Visit: Payer: Self-pay | Admitting: Cardiology

## 2016-11-13 DIAGNOSIS — C44722 Squamous cell carcinoma of skin of right lower limb, including hip: Secondary | ICD-10-CM | POA: Diagnosis not present

## 2016-11-13 DIAGNOSIS — D485 Neoplasm of uncertain behavior of skin: Secondary | ICD-10-CM | POA: Diagnosis not present

## 2016-11-13 DIAGNOSIS — L304 Erythema intertrigo: Secondary | ICD-10-CM | POA: Diagnosis not present

## 2016-11-13 DIAGNOSIS — L570 Actinic keratosis: Secondary | ICD-10-CM | POA: Diagnosis not present

## 2016-11-17 ENCOUNTER — Encounter (HOSPITAL_COMMUNITY): Payer: Self-pay | Admitting: Emergency Medicine

## 2016-11-17 ENCOUNTER — Emergency Department (HOSPITAL_COMMUNITY)
Admission: EM | Admit: 2016-11-17 | Discharge: 2016-11-17 | Disposition: A | Discharge status: Home / Self Care | Payer: PPO | Attending: Emergency Medicine | Admitting: Emergency Medicine

## 2016-11-17 DIAGNOSIS — I119 Hypertensive heart disease without heart failure: Secondary | ICD-10-CM | POA: Insufficient documentation

## 2016-11-17 DIAGNOSIS — Z7982 Long term (current) use of aspirin: Secondary | ICD-10-CM | POA: Insufficient documentation

## 2016-11-17 DIAGNOSIS — Z79899 Other long term (current) drug therapy: Secondary | ICD-10-CM | POA: Insufficient documentation

## 2016-11-17 DIAGNOSIS — T148XXA Other injury of unspecified body region, initial encounter: Secondary | ICD-10-CM

## 2016-11-17 DIAGNOSIS — Z955 Presence of coronary angioplasty implant and graft: Secondary | ICD-10-CM | POA: Insufficient documentation

## 2016-11-17 DIAGNOSIS — R030 Elevated blood-pressure reading, without diagnosis of hypertension: Secondary | ICD-10-CM | POA: Diagnosis not present

## 2016-11-17 DIAGNOSIS — I2510 Atherosclerotic heart disease of native coronary artery without angina pectoris: Secondary | ICD-10-CM | POA: Diagnosis not present

## 2016-11-17 DIAGNOSIS — L7622 Postprocedural hemorrhage and hematoma of skin and subcutaneous tissue following other procedure: Secondary | ICD-10-CM | POA: Insufficient documentation

## 2016-11-17 NOTE — ED Notes (Signed)
ED Provider at bedside. 

## 2016-11-17 NOTE — ED Triage Notes (Signed)
Per [facility] patient from adult day care where she had spot on right leg that was biopsied earlier in week to start bleeding. Patient reports having band aid over spot and had no issues since MOnday.

## 2016-11-17 NOTE — ED Notes (Signed)
Wrapped patient's leg with nonadherent dressing and gauze.

## 2016-11-17 NOTE — ED Provider Notes (Signed)
[facility] DEPT Provider Note   CSN: [id number] Arrival date & time: 11/17/16  1117     History   Chief Complaint Chief Complaint  Patient presents with  . bleeding spot on leg    HPI Erin Roth is a 83 y.o. female who presents with a bleeding wound. PMH significant for CAD, cardiomyopathy, HLD, HTN. She states she had a quarter sized area biopsied on her right anterior shin four days ago. It was suspected to be a squamous cell carcinoma. She took the bandage off two days ago without any issues. Today it started bleeding profusely. She denies scratching the area or bumping it on anything. She is not on blood thinners but does take 81 mg ASA. The area was wrapped and pressure was applied. Bleeding was controlled.  HPI  Past Medical History:  Diagnosis Date  . Anxiety   . Bilateral bunions   . CAD in native artery    a. NSTEMI 2/14 => LHC 03/08/12: Mid LAD 95% (hazy-suggestive of a ruptured plaque), EF 40%, distal anterior, apical and distal inferior HK =>  PCI: Xience DES to the mid LAD.  [name] Cardiomyopathy ([place])    LVEF 45% on cath/echo 03/2012, suspected to be ischemic vs stress-induced  . Depression   . Diverticulosis   . Fracture 2013   Right foot fracture- did not require surgery  . Hyperlipidemia   . Hypertension   . Macular degeneration   . Macular degeneration   . Mild mitral regurgitation    a. Echo 03/09/12: Mild focal basal hypertrophy the septum, EF 45-50%, distal anteroseptal, anterolateral, inferolateral, inferoseptal and apical HK, grade 2 diastolic dysfunction, mild MR, PASP 52  . Osteoporosis   . Right knee meniscal tear 07/2016  . Self-catheterizes urinary bladder   . UTI (lower urinary tract infection)     Patient Active Problem List   Diagnosis Date Noted  . History of MI (myocardial infarction) 07/08/2015  . Dynamic left ventricular outflow obstruction 07/08/2015  . Disturbance of skin sensation 02/19/2013  . Anxiety  03/12/2012  . Hyperlipidemia 03/12/2012  . Urinary retention 03/12/2012  . Essential hypertension 03/12/2012  . CAD (coronary artery disease), native coronary artery 03/12/2012  . Cardiomyopathy ([place]) 03/12/2012  . Mild pulmonary hypertension ([place]) 03/12/2012  . Mild mitral regurgitation 03/12/2012  . NSTEMI (non-ST elevated myocardial infarction) ([place]) 03/09/2012    Past Surgical History:  Procedure Laterality Date  . ABDOMINAL HYSTERECTOMY  1980  . BACK SURGERY  1986  . CARPAL TUNNEL RELEASE Right MAy 2015  . CORONARY ANGIOPLASTY WITH STENT PLACEMENT  03/11/12   95% mid LAD stenosis s/p DES, minimal nonobs dz elsewhere; LVEF 40%, moderate hypokinesis of distal anterior, apical and distal inferior walls, mildly elevated filling pressures, mild pulmonary HTN  . LEFT HEART CATHETERIZATION WITH CORONARY ANGIOGRAM N/A 03/11/2012   Procedure: LEFT HEART CATHETERIZATION WITH CORONARY ANGIOGRAM;  Surgeon: Wellington Hampshire, MD;  Location: [facility];  Service: Cardiovascular;  Laterality: N/A;  . reclast  2013    OB History    No data available       Home Medications    Prior to Admission medications   Medication Sig Start Date End Date Taking? Authorizing Provider  ALPRAZolam Duanne Moron) 0.5 MG tablet Take 0.5 mg by mouth daily.  07/15/15  Yes [provider]  Ascorbic Acid (VITAMIN C) 1000 MG tablet Take 1,000 mg by mouth daily.   Yes [provider]  aspirin 81 MG tablet Take 81  mg by mouth daily.   Yes [provider]  atorvastatin (LIPITOR) 80 MG tablet TAKE 1 TABLET (80 MG TOTAL) BY MOUTH DAILY. 11/16/15  Yes Minus Breeding, MD  cholecalciferol (VITAMIN D) 1000 UNITS tablet Take 1,000 Units by mouth daily.    Yes [provider]  Cranberry (ELLURA PO) Take 1 tablet by mouth daily.    Yes [provider]  lisinopril (PRINIVIL,ZESTRIL) 5 MG tablet TAKE 1 TABLET (5 MG TOTAL) BY MOUTH EVERY MORNING. 11/06/16  Yes Hochrein, Jeneen Rinks, MD    metoprolol tartrate (LOPRESSOR) 25 MG tablet TAKE 1 TABLET (25 MG TOTAL) BY MOUTH 2 (TWO) TIMES DAILY. 12/30/15  Yes Minus Breeding, MD  Multiple Vitamins-Minerals (ICAPS) CAPS Take 1 capsule by mouth every morning.   Yes [provider]  mupirocin ointment (BACTROBAN) 2 % Apply 1 application topically daily. 11/13/16  Yes [provider]  NITROSTAT 0.4 MG SL tablet 1 TABLET UNDER THE TONGUE EVERY 5 MINUTES FOR UP TO 3 DOSES AS NEEDEDFOR CHEST PAIN. 05/29/16  Yes Minus Breeding, MD  Omega-3 Fatty Acids (FISH OIL) 1000 MG CAPS Take 1 capsule by mouth daily.   Yes [provider]  zoledronic acid (RECLAST) 5 MG/100ML SOLN injection Inject 5 mg into the vein once. Next dose due in July 2017   Yes [provider]  HYDROmorphone (DILAUDID) 2 MG tablet Take 0.5 tablets (1 mg total) by mouth every 4 (four) hours as needed for severe pain. Patient not taking: Reported on 11/17/2016 09/24/15   Orpah Greek, MD  ondansetron (ZOFRAN ODT) 4 MG disintegrating tablet Take 1 tablet (4 mg total) by mouth every 8 (eight) hours as needed for nausea. Patient not taking: Reported on 11/17/2016 09/20/15   Larene Pickett, PA-C  oxyCODONE-acetaminophen (PERCOCET/ROXICET) 5-325 MG tablet Take 1 tablet by mouth every 4 (four) hours as needed. Patient not taking: Reported on 11/17/2016 09/20/15   Larene Pickett, PA-C    Family History Family History  Problem Relation Age of Onset  . Colon cancer Father   . Hypertension Mother   . CAD Mother   . Heart attack Mother   . CAD Brother 47       CABG  . Cancer - Lung Brother   . Cancer - Lung Sister   . Hypertension Paternal Grandmother   . Breast cancer Paternal Grandfather     Social History Social History  Substance Use Topics  . Smoking status: Never Smoker  . Smokeless tobacco: Never Used  . Alcohol use Yes     Comment: 4 glasses of wine per week     Allergies   Estrogens; Pneumococcal vaccines; Bee venom;  Lexapro [escitalopram oxalate]; and Sulfa antibiotics   Review of Systems Review of Systems  Musculoskeletal: Negative for arthralgias, gait problem and myalgias.  Skin: Positive for wound.  Neurological: Negative for syncope and light-headedness.  Hematological: Does not bruise/bleed easily.  All other systems reviewed and are negative.    Physical Exam Updated Vital Signs BP 126/79   Pulse 74   Temp (!) 97.5 F (36.4 C) (Oral)   Resp 18   SpO2 100%   Physical Exam  Constitutional: She is oriented to person, place, and time. She appears well-developed and well-nourished. No distress.  Calm, cooperative female in NAD  HENT:  Head: Normocephalic and atraumatic.  Eyes: Pupils are equal, round, and reactive to light. Conjunctivae are normal. Right eye exhibits no discharge. Left eye exhibits no discharge. No scleral icterus.  Neck:  Normal range of motion.  Cardiovascular: Normal rate.   Pulmonary/Chest: Effort normal. No respiratory distress.  Abdominal: She exhibits no distension.  Neurological: She is alert and oriented to person, place, and time.  Skin: Skin is warm and dry.  Quarter sized superficial wound where shave biopsy was performed with blood oozing from capillary bleeding over right anterior distal shin  Psychiatric: She has a normal mood and affect. Her behavior is normal.  Nursing note and vitals reviewed.    ED Treatments / Results  Labs (all labs ordered are listed, but only abnormal results are displayed) Labs Reviewed - No data to display  EKG  EKG Interpretation None       Radiology No results found.  Procedures Procedures (including critical care time)  Medications Ordered in ED Medications - No data to display   Initial Impression / Assessment and Plan / ED Course  I have reviewed the triage vital signs and the nursing notes.  Pertinent labs & imaging results that were available during my care of the patient were reviewed by me and  considered in my medical decision making (see chart for details).  83 year old female presents with wound bleeding from shave biopsy. Wound seal was applied and hemostasis was achieved. Discussed wound care with patient and advised apply pressure if wound continues to bleed. Shared visit with Dr. Sherry Ruffing. Return precautions given.  Final Clinical Impressions(s) / ED Diagnoses   Final diagnoses:  Bleeding from wound    New Prescriptions New Prescriptions   No medications on file     Recardo Evangelist, PA-C 11/17/16 1753    Tegeler, Gwenyth Allegra, MD 11/17/16 2242

## 2016-11-17 NOTE — Discharge Instructions (Signed)
Wound Seal MD Powder quickly forms a seal that stops bleeding and provides a microbial barrier to protect the site. The protective seal stays on the wound until it is healed, then falls off naturally.   1. Keep the wound site dry for 24 hours. You can cover with a non-stick bandage if you like but don't have to.  2. After 24 hours, you can get the area wet, but do not scrub or wash the powder from the wound. As the wound begins to heal, the powder will fall off naturally.   3. As the powder begins to fall off, apply clean Vaseline or Aquaphor ointment to the site to prevent the wound from drying out and forming a thick scab. Studies have shown that wounds heal faster and better when kept moist.   If wound starts bleeding again. Apply pressure to area for 20 minutes continuously.

## 2016-11-19 IMAGING — DX DG CHEST 2V
2 series · 2 of 2 positions shown · non-contrast
Comparison: Single-view of the chest 04/19/2013 and 03/08/2012.

CLINICAL DATA: Chest and left arm pain beginning at [DATE] p.m.
today.

EXAM:
CHEST  2 VIEW

[chest pa]
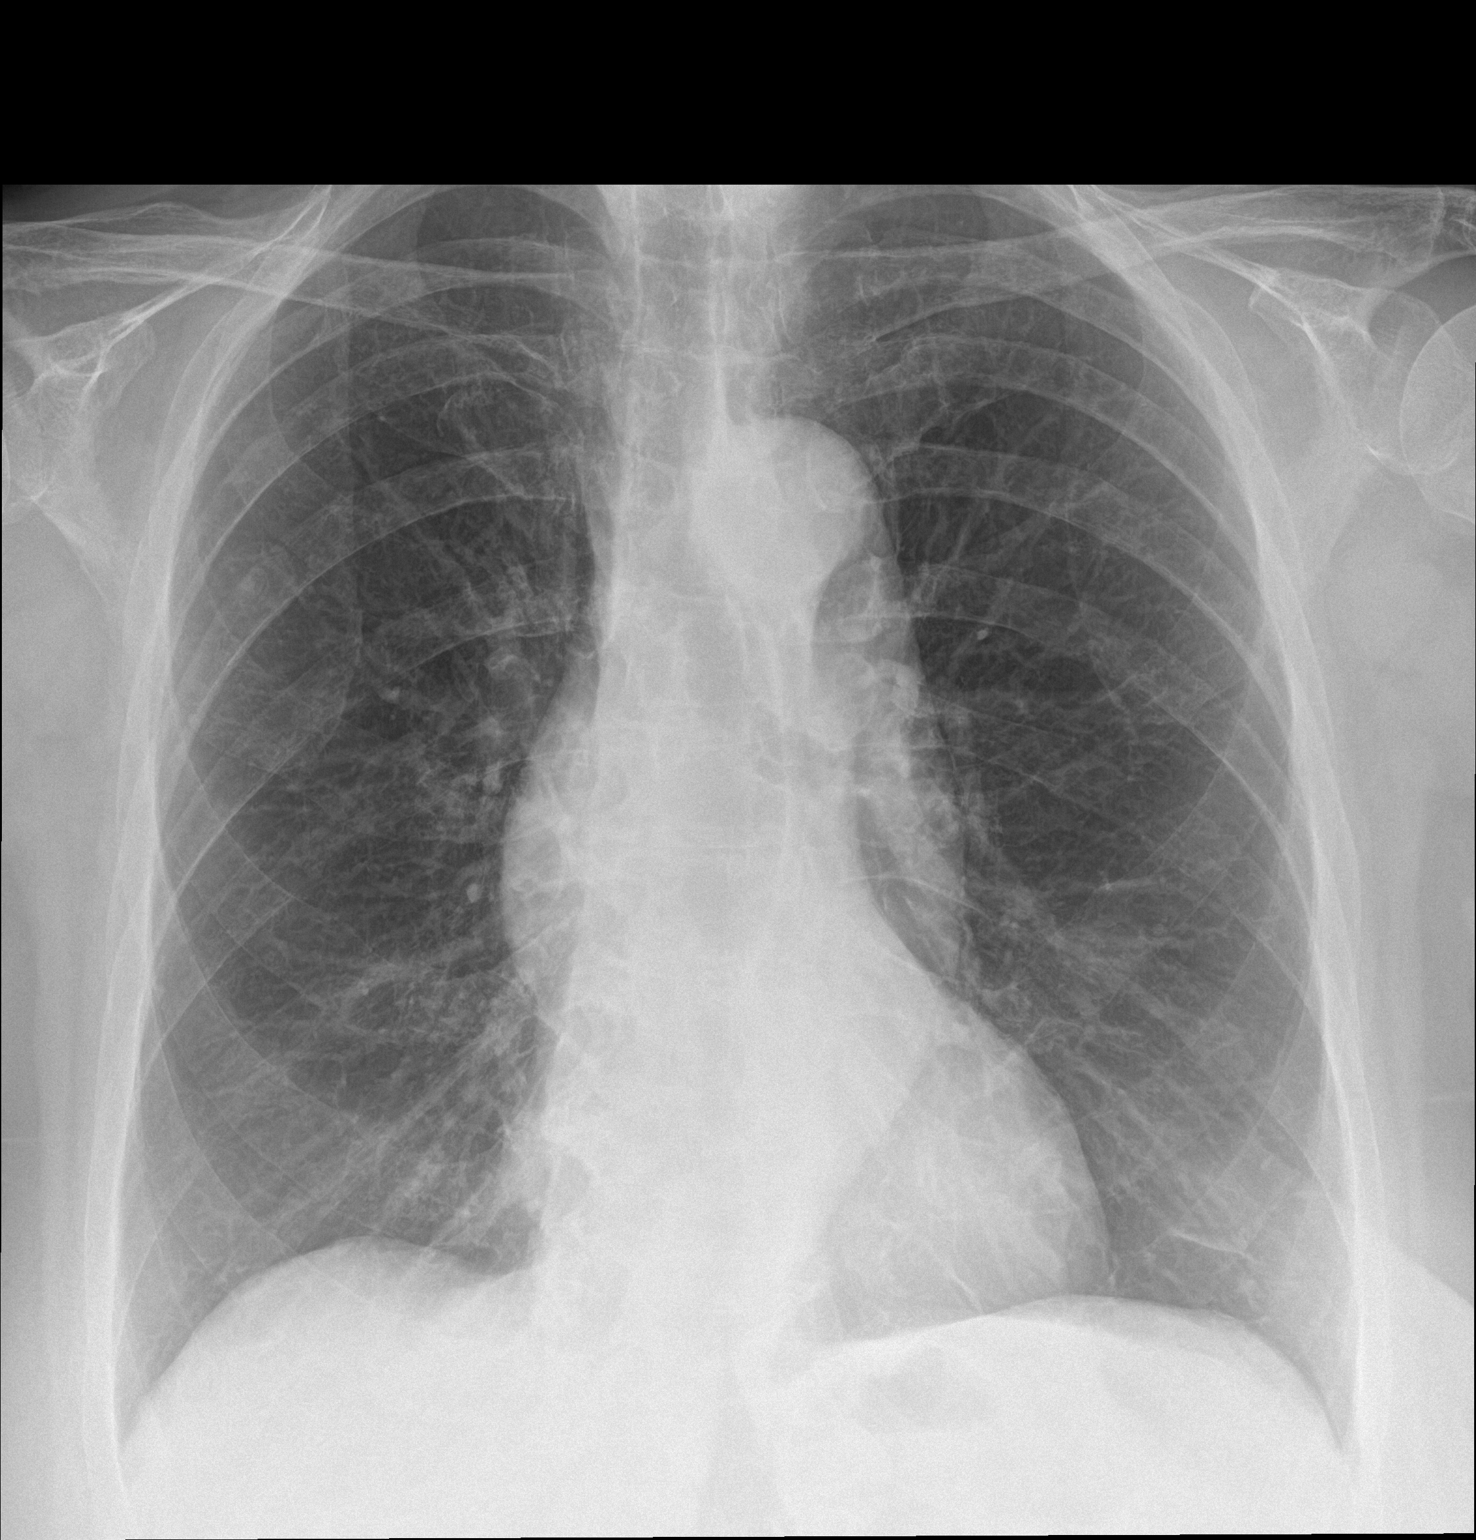

[chest lat]
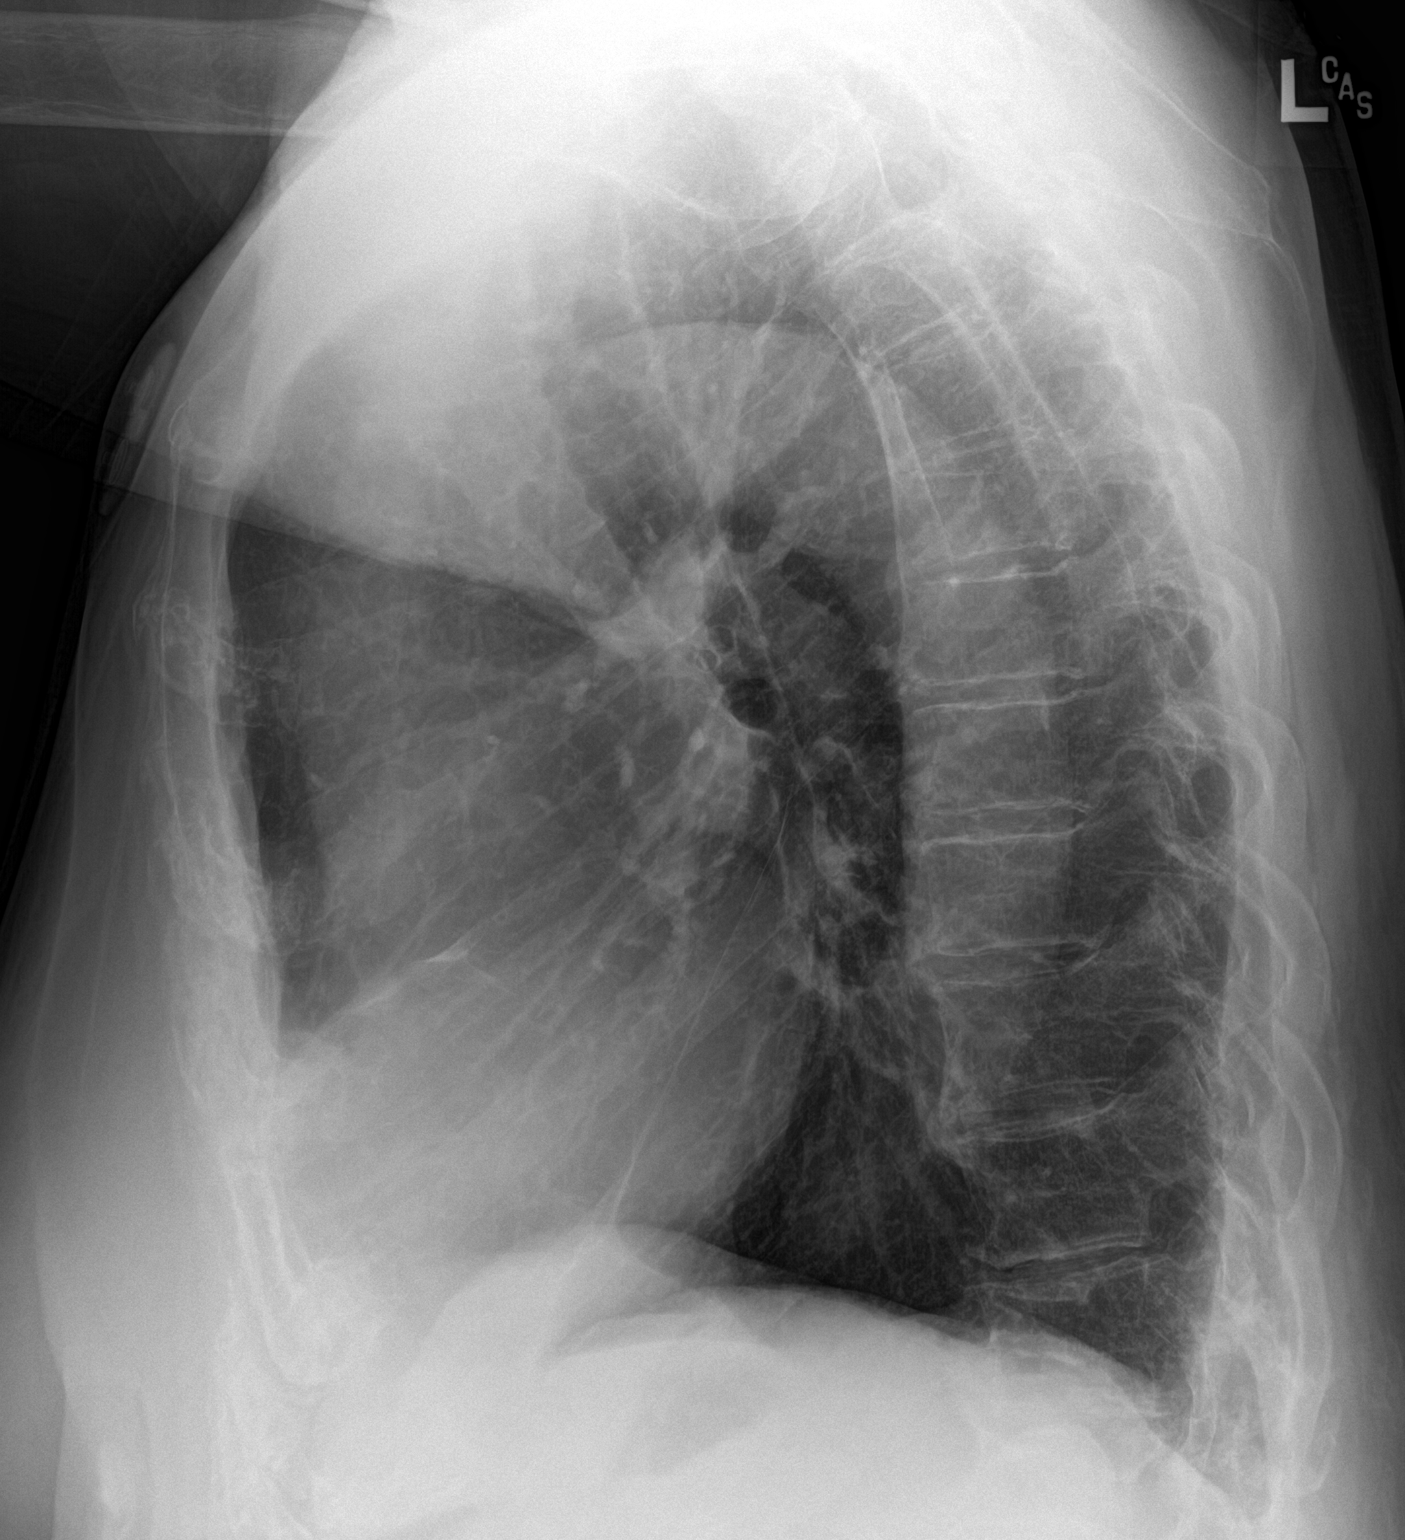

[2 of 2 positions shown; findings below may reference images not displayed]

FINDINGS: The lungs are clear. Heart size is normal. Tortuous aorta is again
seen. No pneumothorax or pleural effusion.
IMPRESSION: No acute disease.  Stable compared to prior exam.

## 2016-11-20 DIAGNOSIS — Z85828 Personal history of other malignant neoplasm of skin: Secondary | ICD-10-CM | POA: Diagnosis not present

## 2016-11-24 DIAGNOSIS — N312 Flaccid neuropathic bladder, not elsewhere classified: Secondary | ICD-10-CM | POA: Diagnosis not present

## 2016-11-24 DIAGNOSIS — N819 Female genital prolapse, unspecified: Secondary | ICD-10-CM | POA: Diagnosis not present

## 2016-11-24 DIAGNOSIS — N8111 Cystocele, midline: Secondary | ICD-10-CM | POA: Diagnosis not present

## 2016-11-24 DIAGNOSIS — N816 Rectocele: Secondary | ICD-10-CM | POA: Diagnosis not present

## 2016-11-24 DIAGNOSIS — N3020 Other chronic cystitis without hematuria: Secondary | ICD-10-CM | POA: Diagnosis not present

## 2016-11-30 DIAGNOSIS — H353132 Nonexudative age-related macular degeneration, bilateral, intermediate dry stage: Secondary | ICD-10-CM | POA: Diagnosis not present

## 2016-11-30 DIAGNOSIS — H35052 Retinal neovascularization, unspecified, left eye: Secondary | ICD-10-CM | POA: Diagnosis not present

## 2016-11-30 DIAGNOSIS — H43811 Vitreous degeneration, right eye: Secondary | ICD-10-CM | POA: Diagnosis not present

## 2016-11-30 DIAGNOSIS — H353221 Exudative age-related macular degeneration, left eye, with active choroidal neovascularization: Secondary | ICD-10-CM | POA: Diagnosis not present

## 2016-12-06 DIAGNOSIS — Z23 Encounter for immunization: Secondary | ICD-10-CM | POA: Diagnosis not present

## 2016-12-27 DIAGNOSIS — N3020 Other chronic cystitis without hematuria: Secondary | ICD-10-CM | POA: Diagnosis not present

## 2016-12-27 DIAGNOSIS — N819 Female genital prolapse, unspecified: Secondary | ICD-10-CM | POA: Diagnosis not present

## 2016-12-27 DIAGNOSIS — R339 Retention of urine, unspecified: Secondary | ICD-10-CM | POA: Diagnosis not present

## 2017-01-03 NOTE — Progress Notes (Signed)
HPI The patient presents for evaluation of CAD.  She had PCI of an LAD ruptured plaque in 2014. At that time she presented with jaw pain.  Last year she was in the hospital with chest pain.   This was atypical and not felt to be anginal.  She had an echo which showed dynamic outflow obstruction with Valsalva with a peak gradient of 13 mm Hg.   I sent her for a POET (Plain Old Exercise Treadmill) which was negative.  She returns for follow up.  Since I last saw her she has done well.  She had a leg biopsy and so has not been in the water but has been doing a bike.  The patient denies any new symptoms such as chest discomfort, neck or arm discomfort. There has been no new shortness of breath, PND or orthopnea. There have been no reported palpitations, presyncope or syncope.   Allergies  Allergen Reactions  . Estrogens     Breast pain  . Pneumococcal Vaccines     rash  . Bee Venom Rash  . Lexapro [Escitalopram Oxalate] Rash    edema  . Sulfa Antibiotics Rash    Current Outpatient Medications  Medication Sig Dispense Refill  . ALPRAZolam (XANAX) 0.5 MG tablet Take 0.5 mg by mouth daily.     . Ascorbic Acid (VITAMIN C) 1000 MG tablet Take 1,000 mg by mouth daily.    [name] aspirin 81 MG tablet Take 81 mg by mouth daily.    [name] atorvastatin (LIPITOR) 80 MG tablet TAKE 1 TABLET (80 MG TOTAL) BY MOUTH DAILY. 90 tablet 3  . cholecalciferol (VITAMIN D) 1000 UNITS tablet Take 1,000 Units by mouth daily.     . Cranberry (ELLURA PO) Take 1 tablet by mouth daily.     [name] lisinopril (PRINIVIL,ZESTRIL) 5 MG tablet TAKE 1 TABLET (5 MG TOTAL) BY MOUTH EVERY MORNING. 90 tablet 2  . metoprolol tartrate (LOPRESSOR) 25 MG tablet TAKE 1 TABLET (25 MG TOTAL) BY MOUTH 2 (TWO) TIMES DAILY. 180 tablet 3  . Multiple Vitamins-Minerals (ICAPS) CAPS Take 1 capsule by mouth every morning.    [name] NITROSTAT 0.4 MG SL tablet 1 TABLET UNDER THE TONGUE EVERY 5 MINUTES FOR UP TO 3 DOSES AS NEEDEDFOR CHEST PAIN. 25 tablet 1  .  Omega-3 Fatty Acids (FISH OIL) 1000 MG CAPS Take 1 capsule by mouth daily.    . zoledronic acid (RECLAST) 5 MG/100ML SOLN injection Inject 5 mg into the vein once. Next dose due in July 2017    . mupirocin ointment (BACTROBAN) 2 % Apply 1 application topically daily.     No current facility-administered medications for this visit.     Past Medical History:  Diagnosis Date  . Anxiety   . Bilateral bunions   . CAD in native artery    a. NSTEMI 2/14 => LHC 03/08/12: Mid LAD 95% (hazy-suggestive of a ruptured plaque), EF 40%, distal anterior, apical and distal inferior HK =>  PCI: Xience DES to the mid LAD.  [name] Cardiomyopathy ([place])    LVEF 45% on cath/echo 03/2012, suspected to be ischemic vs stress-induced  . Depression   . Diverticulosis   . Fracture 2013   Right foot fracture- did not require surgery  . Hyperlipidemia   . Hypertension   . Macular degeneration   . Macular degeneration   . Mild mitral regurgitation    a. Echo 03/09/12: Mild focal basal hypertrophy the septum, EF 45-50%, distal anteroseptal, anterolateral,  inferolateral, inferoseptal and apical HK, grade 2 diastolic dysfunction, mild MR, PASP 52  . Osteoporosis   . Right knee meniscal tear 07/2016  . Self-catheterizes urinary bladder   . UTI (lower urinary tract infection)     Past Surgical History:  Procedure Laterality Date  . ABDOMINAL HYSTERECTOMY  1980  . BACK SURGERY  1986  . CARPAL TUNNEL RELEASE Right MAy 2015  . CORONARY ANGIOPLASTY WITH STENT PLACEMENT  03/11/12   95% mid LAD stenosis s/p DES, minimal nonobs dz elsewhere; LVEF 40%, moderate hypokinesis of distal anterior, apical and distal inferior walls, mildly elevated filling pressures, mild pulmonary HTN  . LEFT HEART CATHETERIZATION WITH CORONARY ANGIOGRAM N/A 03/11/2012   Procedure: LEFT HEART CATHETERIZATION WITH CORONARY ANGIOGRAM;  Surgeon: Wellington Hampshire, MD;  Location: [facility];  Service: Cardiovascular;  Laterality: N/A;  . reclast  2013     ROS:     As stated in the HPI and negative for all other systems.   PHYSICAL EXAM BP (!) 144/90   Pulse 64   Ht 5\' 6"  (1.676 m)   Wt 156 lb (70.8 kg)   BMI 25.18 kg/m   GENERAL:  Well appearing NECK:  No jugular venous distention, waveform within normal limits, carotid upstroke brisk and symmetric, no bruits, no thyromegaly LUNGS:  Clear to auscultation bilaterally CHEST:  Unremarkable HEART:  PMI not displaced or sustained,S1 and S2 within normal limits, no S3, no S4, no clicks, no rubs, 2 out of 6 apical systolic murmur slightly radiating at the aortic outflow tract, no diastolic murmurs ABD:  Flat, positive bowel sounds normal in frequency in pitch, no bruits, no rebound, no guarding, no midline pulsatile mass, no hepatomegaly, no splenomegaly EXT:  2 plus pulses throughout, no edema, no cyanosis no clubbing  EKG: Sinus rhythm, rate 64, axis within normal limits, intervals within normal limits, no acute ST-T wave changes.  01/04/2017   ASSESSMENT AND PLAN  CHEST PAIN:  She had a negative POET (Plain Old Exercise Treadmill) in June of last year.  She has had no new symptoms.    No change in therapy is indicated.   CAD:  As above.   HTN:  The blood pressure is slightly high but not usually at home where it is in the 254D systolic.  Given this no change in therapy is needed.   HYPERLIPIDEMIA:     She is overdue for a lipid profile.  She needs to get a lipid profile at her primary care office and we will call to arrange this.  MURMUR: She had a slight subvalvular gradient in the past but no symptoms and this was mild and not worse by exam.  No further imaging is indicated at this point.

## 2017-01-04 ENCOUNTER — Encounter: Payer: Self-pay | Admitting: Cardiology

## 2017-01-04 ENCOUNTER — Ambulatory Visit (INDEPENDENT_AMBULATORY_CARE_PROVIDER_SITE_OTHER): Payer: PPO | Admitting: Cardiology

## 2017-01-04 VITALS — BP 144/90 | HR 64 | Ht 66.0 in | Wt 156.0 lb

## 2017-01-04 DIAGNOSIS — E785 Hyperlipidemia, unspecified: Secondary | ICD-10-CM | POA: Diagnosis not present

## 2017-01-04 DIAGNOSIS — R011 Cardiac murmur, unspecified: Secondary | ICD-10-CM | POA: Diagnosis not present

## 2017-01-04 DIAGNOSIS — I10 Essential (primary) hypertension: Secondary | ICD-10-CM

## 2017-01-04 DIAGNOSIS — I2510 Atherosclerotic heart disease of native coronary artery without angina pectoris: Secondary | ICD-10-CM | POA: Diagnosis not present

## 2017-01-04 NOTE — Patient Instructions (Signed)
Medication Instructions:  Continue current medications  If you need a refill on your cardiac medications before your next appointment, please call your pharmacy.  Labwork: None Ordered   Testing/Procedures: None Ordered  Follow-Up: Your physician wants you to follow-up in: 1 Year. You should receive a reminder letter in the mail two months in advance. If you do not receive a letter, please call our office [phone].    Thank you for choosing [facility]!!      

## 2017-01-11 DIAGNOSIS — H35363 Drusen (degenerative) of macula, bilateral: Secondary | ICD-10-CM | POA: Diagnosis not present

## 2017-01-11 DIAGNOSIS — H353221 Exudative age-related macular degeneration, left eye, with active choroidal neovascularization: Secondary | ICD-10-CM | POA: Diagnosis not present

## 2017-01-11 DIAGNOSIS — H35052 Retinal neovascularization, unspecified, left eye: Secondary | ICD-10-CM | POA: Diagnosis not present

## 2017-01-11 DIAGNOSIS — H353132 Nonexudative age-related macular degeneration, bilateral, intermediate dry stage: Secondary | ICD-10-CM | POA: Diagnosis not present

## 2017-01-29 DIAGNOSIS — R339 Retention of urine, unspecified: Secondary | ICD-10-CM | POA: Diagnosis not present

## 2017-01-31 ENCOUNTER — Other Ambulatory Visit: Payer: Self-pay | Admitting: Cardiology

## 2017-02-02 DIAGNOSIS — N312 Flaccid neuropathic bladder, not elsewhere classified: Secondary | ICD-10-CM | POA: Diagnosis not present

## 2017-02-02 DIAGNOSIS — N819 Female genital prolapse, unspecified: Secondary | ICD-10-CM | POA: Diagnosis not present

## 2017-02-05 DIAGNOSIS — N819 Female genital prolapse, unspecified: Secondary | ICD-10-CM | POA: Diagnosis not present

## 2017-02-12 DIAGNOSIS — L814 Other melanin hyperpigmentation: Secondary | ICD-10-CM | POA: Diagnosis not present

## 2017-02-12 DIAGNOSIS — L821 Other seborrheic keratosis: Secondary | ICD-10-CM | POA: Diagnosis not present

## 2017-02-12 DIAGNOSIS — L570 Actinic keratosis: Secondary | ICD-10-CM | POA: Diagnosis not present

## 2017-02-12 DIAGNOSIS — Z85828 Personal history of other malignant neoplasm of skin: Secondary | ICD-10-CM | POA: Diagnosis not present

## 2017-02-12 DIAGNOSIS — L720 Epidermal cyst: Secondary | ICD-10-CM | POA: Diagnosis not present

## 2017-02-22 DIAGNOSIS — H353221 Exudative age-related macular degeneration, left eye, with active choroidal neovascularization: Secondary | ICD-10-CM | POA: Diagnosis not present

## 2017-02-22 DIAGNOSIS — H4322 Crystalline deposits in vitreous body, left eye: Secondary | ICD-10-CM | POA: Diagnosis not present

## 2017-02-22 DIAGNOSIS — H35052 Retinal neovascularization, unspecified, left eye: Secondary | ICD-10-CM | POA: Diagnosis not present

## 2017-02-22 DIAGNOSIS — H353132 Nonexudative age-related macular degeneration, bilateral, intermediate dry stage: Secondary | ICD-10-CM | POA: Diagnosis not present

## 2017-02-27 DIAGNOSIS — R339 Retention of urine, unspecified: Secondary | ICD-10-CM | POA: Diagnosis not present

## 2017-03-12 IMAGING — US US RENAL
1 series · 14 of 25 positions shown · non-contrast
Comparison: 09/20/2015 abdominal CT

CLINICAL DATA: Left flank pain for 4 days

EXAM:
RENAL / URINARY TRACT ULTRASOUND COMPLETE

[Series 1: us renal · 0.23mm/px · 14 of 39 slices shown]
[im 1/39]
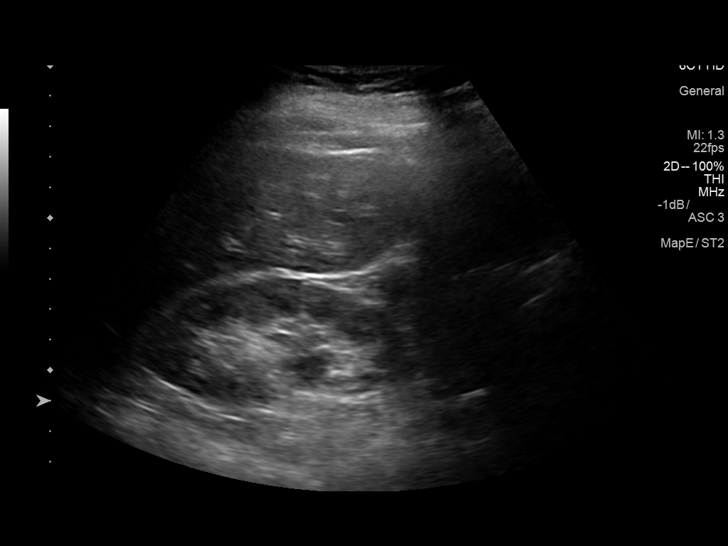
[im 4/39]
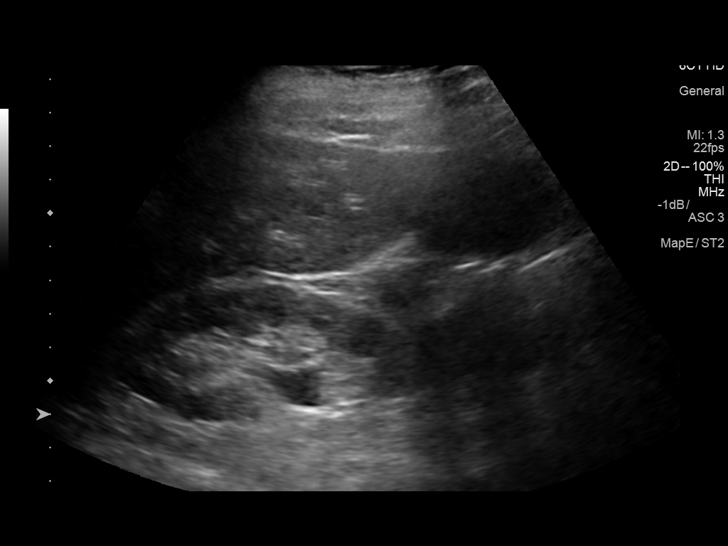
[im 7/39]
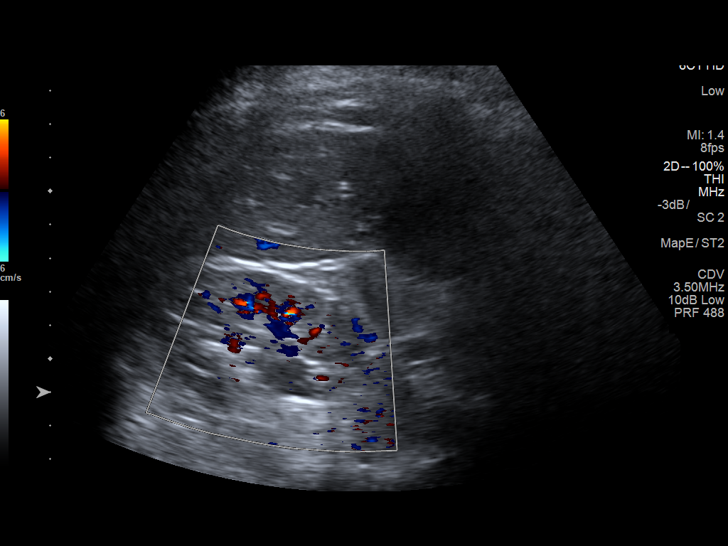
[im 10/39]
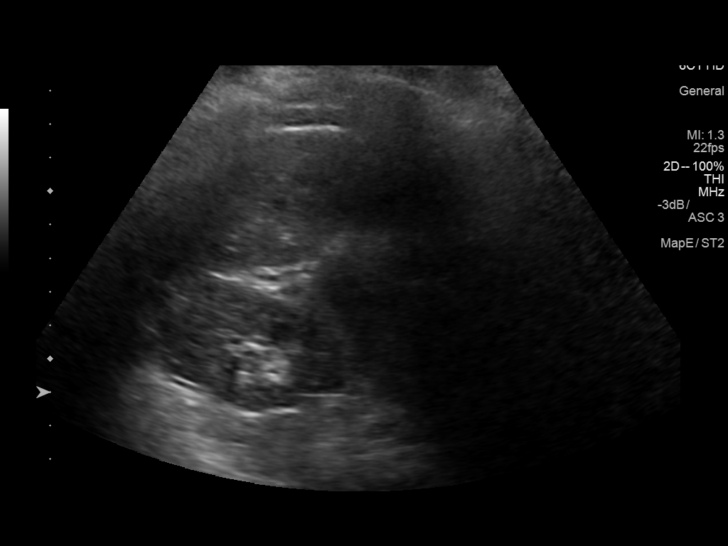
[im 13/39]
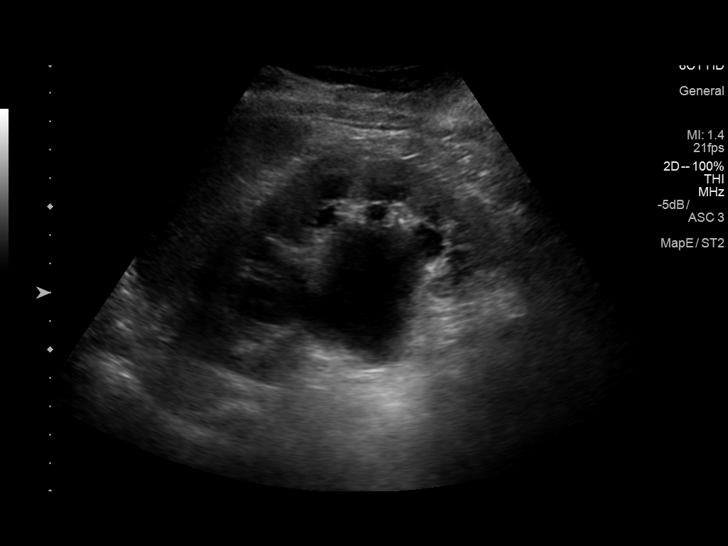
[im 15/39]
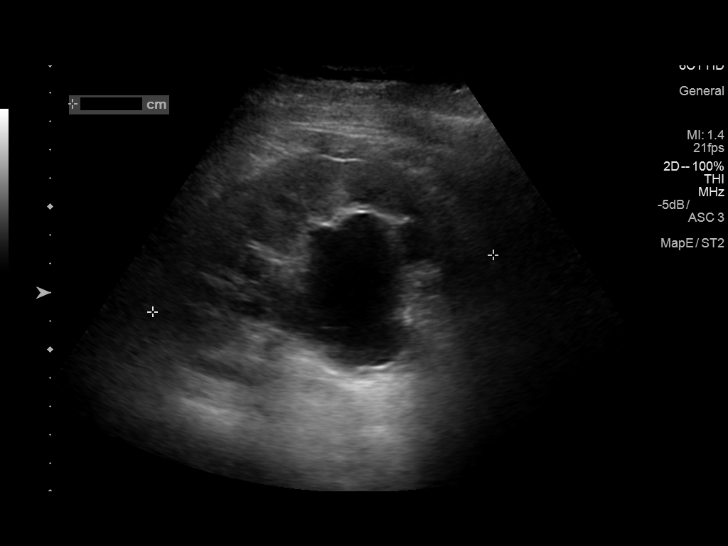
[im 18/39]
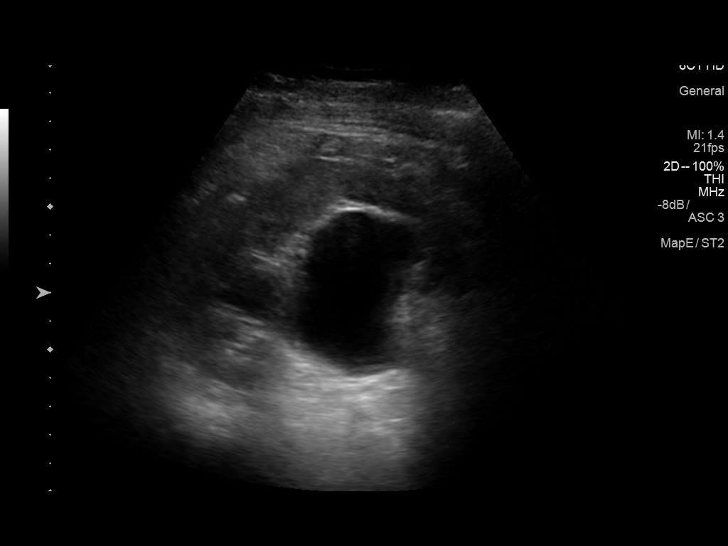
[im 21/39]
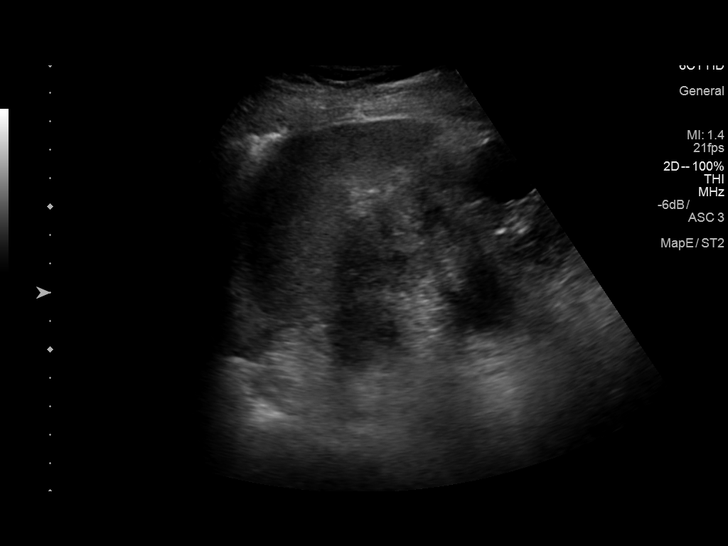
[im 24/39]
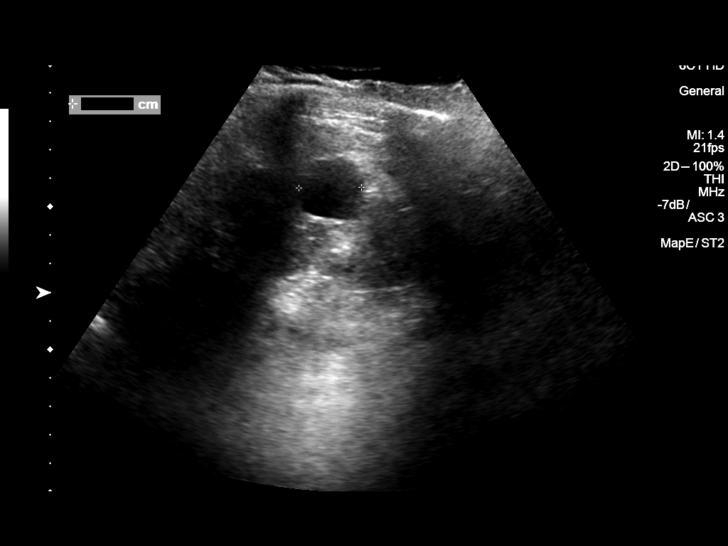
[im 26/39]
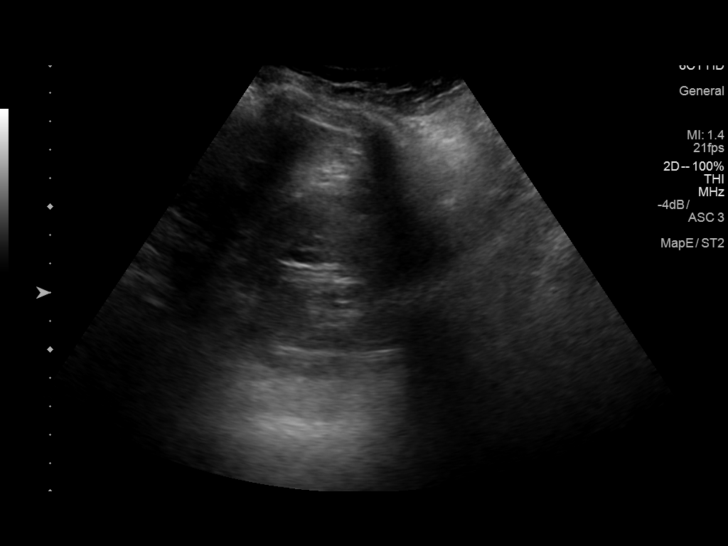
[im 29/39]
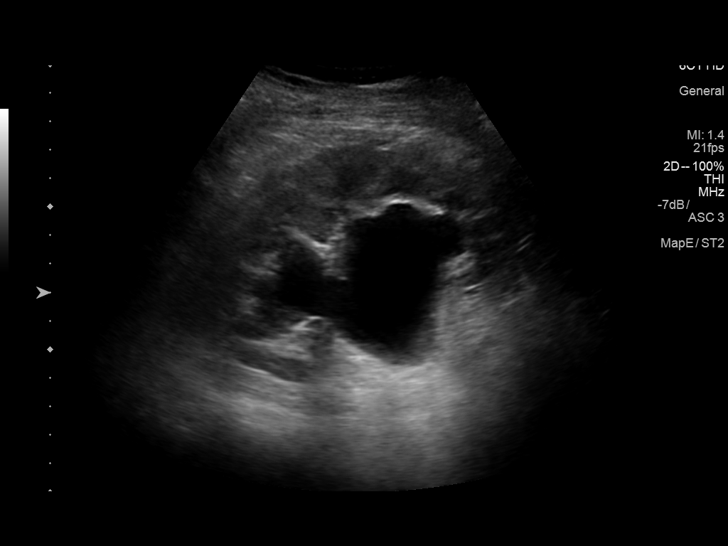
[im 32/39]
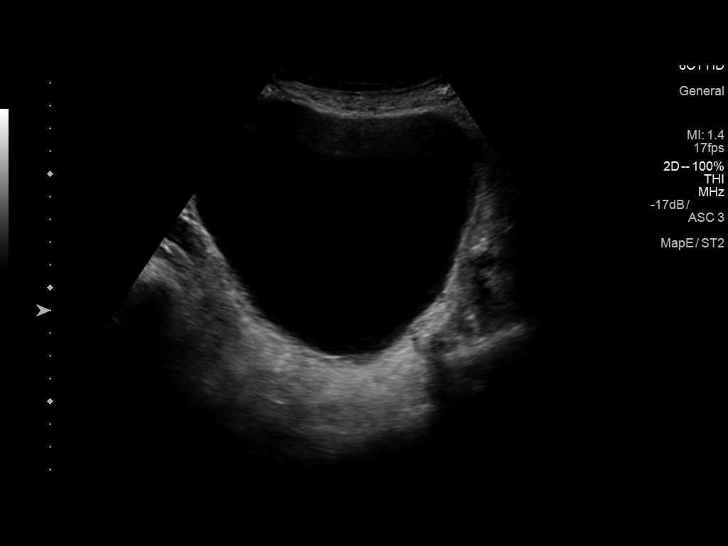
[im 35/39]
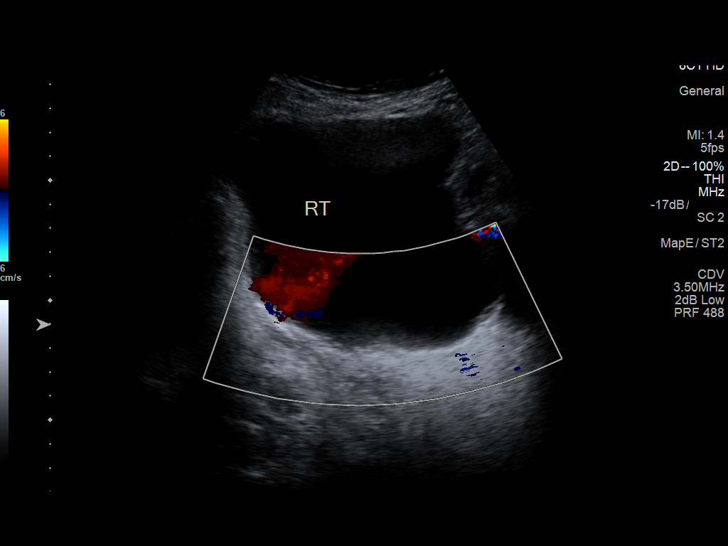
[im 39/39]
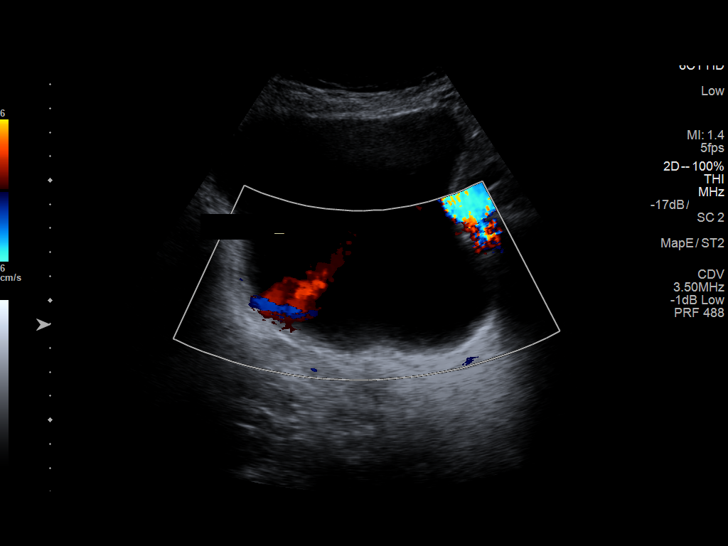

[14 of 25 positions shown; findings below may reference images not displayed]

FINDINGS: Right Kidney:

Length: 11.5 cm. Echogenicity within normal limits. No mass or
hydronephrosis visualized.

Left Kidney:

Length: 12 cm. Moderate left hydronephrosis that is stable to
progressed from previous CT. No visible obstructive process. Simple
26 mm cyst in the cortex.

Bladder:

Appears normal for degree of bladder distention. A left ureteral jet
was not seen, during which time 5 right ureteral jets were noted.
IMPRESSION: Moderate left hydronephrosis, borderline increased from recent CT.
No left ureteral jet visualized.

## 2017-03-19 DIAGNOSIS — M1711 Unilateral primary osteoarthritis, right knee: Secondary | ICD-10-CM | POA: Diagnosis not present

## 2017-03-27 DIAGNOSIS — J209 Acute bronchitis, unspecified: Secondary | ICD-10-CM | POA: Diagnosis not present

## 2017-03-29 DIAGNOSIS — R339 Retention of urine, unspecified: Secondary | ICD-10-CM | POA: Diagnosis not present

## 2017-04-02 DIAGNOSIS — H35052 Retinal neovascularization, unspecified, left eye: Secondary | ICD-10-CM | POA: Diagnosis not present

## 2017-04-02 DIAGNOSIS — H353132 Nonexudative age-related macular degeneration, bilateral, intermediate dry stage: Secondary | ICD-10-CM | POA: Diagnosis not present

## 2017-04-02 DIAGNOSIS — H353221 Exudative age-related macular degeneration, left eye, with active choroidal neovascularization: Secondary | ICD-10-CM | POA: Diagnosis not present

## 2017-04-02 DIAGNOSIS — H35363 Drusen (degenerative) of macula, bilateral: Secondary | ICD-10-CM | POA: Diagnosis not present

## 2017-04-23 DIAGNOSIS — R339 Retention of urine, unspecified: Secondary | ICD-10-CM | POA: Diagnosis not present

## 2017-04-25 ENCOUNTER — Telehealth: Payer: Self-pay | Admitting: Cardiology

## 2017-04-25 NOTE — Telephone Encounter (Signed)
Returned the call to the patient. She stated that since she had her cortisone injections in her knees a month ago that she has been having a burning in her chest about once a day. This is relieved with Tums and indigestion medication. She denies any other symptoms. She stated that she will call her PCP and make an appointment to be seen. She will call back if we can do anything further for her.

## 2017-04-25 NOTE — Telephone Encounter (Signed)
Erin Roth is calling because she is having a burning sensation in her chest that comes and goes . Please call

## 2017-05-07 DIAGNOSIS — H353221 Exudative age-related macular degeneration, left eye, with active choroidal neovascularization: Secondary | ICD-10-CM | POA: Diagnosis not present

## 2017-05-07 DIAGNOSIS — H35052 Retinal neovascularization, unspecified, left eye: Secondary | ICD-10-CM | POA: Diagnosis not present

## 2017-05-07 DIAGNOSIS — H353132 Nonexudative age-related macular degeneration, bilateral, intermediate dry stage: Secondary | ICD-10-CM | POA: Diagnosis not present

## 2017-05-07 DIAGNOSIS — H43811 Vitreous degeneration, right eye: Secondary | ICD-10-CM | POA: Diagnosis not present

## 2017-05-24 DIAGNOSIS — R339 Retention of urine, unspecified: Secondary | ICD-10-CM | POA: Diagnosis not present

## 2017-06-04 DIAGNOSIS — H353221 Exudative age-related macular degeneration, left eye, with active choroidal neovascularization: Secondary | ICD-10-CM | POA: Diagnosis not present

## 2017-06-04 DIAGNOSIS — H35052 Retinal neovascularization, unspecified, left eye: Secondary | ICD-10-CM | POA: Diagnosis not present

## 2017-06-04 DIAGNOSIS — H353132 Nonexudative age-related macular degeneration, bilateral, intermediate dry stage: Secondary | ICD-10-CM | POA: Diagnosis not present

## 2017-06-04 DIAGNOSIS — H35363 Drusen (degenerative) of macula, bilateral: Secondary | ICD-10-CM | POA: Diagnosis not present

## 2017-06-11 DIAGNOSIS — D1723 Benign lipomatous neoplasm of skin and subcutaneous tissue of right leg: Secondary | ICD-10-CM | POA: Diagnosis not present

## 2017-06-11 DIAGNOSIS — Z85828 Personal history of other malignant neoplasm of skin: Secondary | ICD-10-CM | POA: Diagnosis not present

## 2017-06-11 DIAGNOSIS — Q828 Other specified congenital malformations of skin: Secondary | ICD-10-CM | POA: Diagnosis not present

## 2017-06-11 DIAGNOSIS — L84 Corns and callosities: Secondary | ICD-10-CM | POA: Diagnosis not present

## 2017-06-11 DIAGNOSIS — L821 Other seborrheic keratosis: Secondary | ICD-10-CM | POA: Diagnosis not present

## 2017-06-11 DIAGNOSIS — D1801 Hemangioma of skin and subcutaneous tissue: Secondary | ICD-10-CM | POA: Diagnosis not present

## 2017-06-11 DIAGNOSIS — L720 Epidermal cyst: Secondary | ICD-10-CM | POA: Diagnosis not present

## 2017-06-26 DIAGNOSIS — R339 Retention of urine, unspecified: Secondary | ICD-10-CM | POA: Diagnosis not present

## 2017-07-02 DIAGNOSIS — H35052 Retinal neovascularization, unspecified, left eye: Secondary | ICD-10-CM | POA: Diagnosis not present

## 2017-07-02 DIAGNOSIS — H353132 Nonexudative age-related macular degeneration, bilateral, intermediate dry stage: Secondary | ICD-10-CM | POA: Diagnosis not present

## 2017-07-02 DIAGNOSIS — H353222 Exudative age-related macular degeneration, left eye, with inactive choroidal neovascularization: Secondary | ICD-10-CM | POA: Diagnosis not present

## 2017-07-02 DIAGNOSIS — H43811 Vitreous degeneration, right eye: Secondary | ICD-10-CM | POA: Diagnosis not present

## 2017-07-03 DIAGNOSIS — N8111 Cystocele, midline: Secondary | ICD-10-CM | POA: Diagnosis not present

## 2017-07-12 DIAGNOSIS — R0789 Other chest pain: Secondary | ICD-10-CM | POA: Diagnosis not present

## 2017-07-27 DIAGNOSIS — R339 Retention of urine, unspecified: Secondary | ICD-10-CM | POA: Diagnosis not present

## 2017-08-05 ENCOUNTER — Other Ambulatory Visit: Payer: Self-pay | Admitting: Cardiology

## 2017-08-06 DIAGNOSIS — H00014 Hordeolum externum left upper eyelid: Secondary | ICD-10-CM | POA: Diagnosis not present

## 2017-08-10 DIAGNOSIS — H019 Unspecified inflammation of eyelid: Secondary | ICD-10-CM | POA: Diagnosis not present

## 2017-08-20 DIAGNOSIS — H35052 Retinal neovascularization, unspecified, left eye: Secondary | ICD-10-CM | POA: Diagnosis not present

## 2017-08-20 DIAGNOSIS — H353222 Exudative age-related macular degeneration, left eye, with inactive choroidal neovascularization: Secondary | ICD-10-CM | POA: Diagnosis not present

## 2017-08-20 DIAGNOSIS — H0014 Chalazion left upper eyelid: Secondary | ICD-10-CM | POA: Diagnosis not present

## 2017-08-20 DIAGNOSIS — H353132 Nonexudative age-related macular degeneration, bilateral, intermediate dry stage: Secondary | ICD-10-CM | POA: Diagnosis not present

## 2017-08-22 DIAGNOSIS — H0014 Chalazion left upper eyelid: Secondary | ICD-10-CM | POA: Diagnosis not present

## 2017-08-27 DIAGNOSIS — R339 Retention of urine, unspecified: Secondary | ICD-10-CM | POA: Diagnosis not present

## 2017-09-04 DIAGNOSIS — Z1231 Encounter for screening mammogram for malignant neoplasm of breast: Secondary | ICD-10-CM | POA: Diagnosis not present

## 2017-09-04 DIAGNOSIS — M810 Age-related osteoporosis without current pathological fracture: Secondary | ICD-10-CM | POA: Diagnosis not present

## 2017-09-06 DIAGNOSIS — R300 Dysuria: Secondary | ICD-10-CM | POA: Diagnosis not present

## 2017-09-06 DIAGNOSIS — N3000 Acute cystitis without hematuria: Secondary | ICD-10-CM | POA: Diagnosis not present

## 2017-09-26 DIAGNOSIS — R339 Retention of urine, unspecified: Secondary | ICD-10-CM | POA: Diagnosis not present

## 2017-10-08 DIAGNOSIS — R350 Frequency of micturition: Secondary | ICD-10-CM | POA: Diagnosis not present

## 2017-10-08 DIAGNOSIS — E785 Hyperlipidemia, unspecified: Secondary | ICD-10-CM | POA: Diagnosis not present

## 2017-10-08 DIAGNOSIS — I8393 Asymptomatic varicose veins of bilateral lower extremities: Secondary | ICD-10-CM | POA: Diagnosis not present

## 2017-10-08 DIAGNOSIS — M810 Age-related osteoporosis without current pathological fracture: Secondary | ICD-10-CM | POA: Diagnosis not present

## 2017-10-08 DIAGNOSIS — M7989 Other specified soft tissue disorders: Secondary | ICD-10-CM | POA: Diagnosis not present

## 2017-10-08 DIAGNOSIS — Z23 Encounter for immunization: Secondary | ICD-10-CM | POA: Diagnosis not present

## 2017-10-08 DIAGNOSIS — F419 Anxiety disorder, unspecified: Secondary | ICD-10-CM | POA: Diagnosis not present

## 2017-10-08 DIAGNOSIS — I10 Essential (primary) hypertension: Secondary | ICD-10-CM | POA: Diagnosis not present

## 2017-10-09 DIAGNOSIS — I10 Essential (primary) hypertension: Secondary | ICD-10-CM | POA: Diagnosis not present

## 2017-10-09 DIAGNOSIS — E785 Hyperlipidemia, unspecified: Secondary | ICD-10-CM | POA: Diagnosis not present

## 2017-10-09 DIAGNOSIS — R350 Frequency of micturition: Secondary | ICD-10-CM | POA: Diagnosis not present

## 2017-10-24 DIAGNOSIS — M810 Age-related osteoporosis without current pathological fracture: Secondary | ICD-10-CM | POA: Diagnosis not present

## 2017-10-26 DIAGNOSIS — R339 Retention of urine, unspecified: Secondary | ICD-10-CM | POA: Diagnosis not present

## 2017-11-05 DIAGNOSIS — H353222 Exudative age-related macular degeneration, left eye, with inactive choroidal neovascularization: Secondary | ICD-10-CM | POA: Diagnosis not present

## 2017-11-05 DIAGNOSIS — H4322 Crystalline deposits in vitreous body, left eye: Secondary | ICD-10-CM | POA: Diagnosis not present

## 2017-11-05 DIAGNOSIS — H353132 Nonexudative age-related macular degeneration, bilateral, intermediate dry stage: Secondary | ICD-10-CM | POA: Diagnosis not present

## 2017-11-05 DIAGNOSIS — H43811 Vitreous degeneration, right eye: Secondary | ICD-10-CM | POA: Diagnosis not present

## 2017-11-08 NOTE — Progress Notes (Signed)
HPI The patient presents for evaluation of CAD.  She had PCI of an LAD ruptured plaque in 2014. At that time she presented with jaw pain.  In 2017 she was in the hospital with chest pain.   This was atypical and not felt to be anginal.  She had an echo which showed dynamic outflow obstruction with Valsalva with a peak gradient of 13 mm Hg.   I sent her for a POET (Plain Old Exercise Treadmill) which was negative.  She returns for follow up. She did have chest pain earlier this year after getting some injections in her knee.    She is had a couple of episodes of chest discomfort.  One was when she was eating and this was a sharp discomfort that went through to her back to the past quickly.  She had a couple of episodes of feeling somewhat tired after she got back from her long driveway getting the mail.  There might of been some discomfort.  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  She does some exercises on a machine at home.  She can walk up an incline without getting this discomfort.  These have been sporadic episodes and rare.  She typically feels very good.  She has had none of the jaw pain that was her previous angina.  She has not had anything when she thought she needed to take nitroglycerin.  Allergies  Allergen Reactions  . Estrogens     Breast pain  . Pneumococcal Vaccines     rash  . Bee Venom Rash  . Lexapro [Escitalopram Oxalate] Rash    edema  . Sulfa Antibiotics Rash    Current Outpatient Medications  Medication Sig Dispense Refill  . ALPRAZolam (XANAX) 0.5 MG tablet Take 0.5 mg by mouth daily.     . Ascorbic Acid (VITAMIN C) 1000 MG tablet Take 1,000 mg by mouth daily.    [name] aspirin 81 MG tablet Take 81 mg by mouth daily.    [name] atorvastatin (LIPITOR) 80 MG tablet Take 1 tablet (80 mg total) by mouth daily. 90 tablet 3  . cholecalciferol (VITAMIN D) 1000 UNITS tablet Take 1,000 Units by mouth daily.     . Cranberry (ELLURA PO) Take 1 tablet by mouth daily.      [name] denosumab (PROLIA) 60 MG/ML SOSY injection Inject 60 mg into the skin every 6 (six) months.    [name] lisinopril (PRINIVIL,ZESTRIL) 5 MG tablet Take 1 tablet (5 mg total) by mouth every morning. 90 tablet 3  . metoprolol tartrate (LOPRESSOR) 25 MG tablet Take 1 tablet (25 mg total) by mouth 2 (two) times daily. 180 tablet 3  . Multiple Vitamins-Minerals (ICAPS) CAPS Take 1 capsule by mouth every morning.    . mupirocin ointment (BACTROBAN) 2 % Apply 1 application topically daily.    [name] NITROSTAT 0.4 MG SL tablet 1 TABLET UNDER THE TONGUE EVERY 5 MINUTES FOR UP TO 3 DOSES AS NEEDEDFOR CHEST PAIN. 25 tablet 1  . Omega-3 Fatty Acids (FISH OIL) 1000 MG CAPS Take 1 capsule by mouth daily.     No current facility-administered medications for this visit.     Past Medical History:  Diagnosis Date  . Anxiety   . Bilateral bunions   . CAD in native artery    a. NSTEMI 2/14 => LHC 03/08/12: Mid LAD 95% (hazy-suggestive of a ruptured plaque), EF 40%, distal anterior, apical and distal inferior HK =>  PCI: Xience DES  to the mid LAD.  [name] Cardiomyopathy ([place])    LVEF 45% on cath/echo 03/2012, suspected to be ischemic vs stress-induced  . Depression   . Diverticulosis   . Fracture 2013   Right foot fracture- did not require surgery  . Hyperlipidemia   . Hypertension   . Macular degeneration   . Macular degeneration   . Mild mitral regurgitation    a. Echo 03/09/12: Mild focal basal hypertrophy the septum, EF 45-50%, distal anteroseptal, anterolateral, inferolateral, inferoseptal and apical HK, grade 2 diastolic dysfunction, mild MR, PASP 52  . Osteoporosis   . Right knee meniscal tear 07/2016  . Self-catheterizes urinary bladder   . UTI (lower urinary tract infection)     Past Surgical History:  Procedure Laterality Date  . ABDOMINAL HYSTERECTOMY  1980  . BACK SURGERY  1986  . CARPAL TUNNEL RELEASE Right MAy 2015  . CORONARY ANGIOPLASTY WITH STENT PLACEMENT  03/11/12   95% mid LAD stenosis s/p DES,  minimal nonobs dz elsewhere; LVEF 40%, moderate hypokinesis of distal anterior, apical and distal inferior walls, mildly elevated filling pressures, mild pulmonary HTN  . LEFT HEART CATHETERIZATION WITH CORONARY ANGIOGRAM N/A 03/11/2012   Procedure: LEFT HEART CATHETERIZATION WITH CORONARY ANGIOGRAM;  Surgeon: Wellington Hampshire, MD;  Location: [facility];  Service: Cardiovascular;  Laterality: N/A;  . reclast  2013    ROS:   As stated in the HPI and negative for all other systems.  PHYSICAL EXAM BP 120/86   Pulse 64   Ht 5\' 6"  (1.676 m)   Wt 154 lb (69.9 kg)   SpO2 97%   BMI 24.86 kg/m   GENERAL:  Well appearing NECK:  No jugular venous distention, waveform within normal limits, carotid upstroke brisk and symmetric, no bruits, no thyromegaly LUNGS:  Clear to auscultation bilaterally CHEST:  Unremarkable HEART:  PMI not displaced or sustained,S1 and S2 within normal limits, no S3, no S4, no clicks, no rubs, 2 out of 6 apical systolic murmur radiating slightly at the aortic outflow tract no increased with the strain phase of Valsalva, no diastolic murmurs ABD:  Flat, positive bowel sounds normal in frequency in pitch, no bruits, no rebound, no guarding, no midline pulsatile mass, no hepatomegaly, no splenomegaly EXT:  2 plus pulses throughout, no edema, no cyanosis no clubbing   EKG: Sinus rhythm, rate 64, axis within normal limits, intervals within normal limits, no acute ST-T wave changes.  11/09/2017   ASSESSMENT AND PLAN  CHEST PAIN:  She had a negative POET (Plain Old Exercise Treadmill) in June of 2017.  She has some atypical rare chest discomfort.  However, this is not like her previous angina.  I do not think that further testing is indicated.  CAD:  As above.   HTN:  The blood pressure is at target.  No change in therapy. ed.   HYPERLIPIDEMIA:      Her LDL in September was 34 with HDL of 57.  She will continue on the meds as listed.  MURMUR: She had a slight subvalvular  gradient in the past but no symptoms.  She is not having any symptoms.  I do not think her exam has changed.  Therefore, based on this I will think further testing is indicated this year although I might consider an echo next year.

## 2017-11-09 ENCOUNTER — Encounter: Payer: Self-pay | Admitting: Cardiology

## 2017-11-09 ENCOUNTER — Ambulatory Visit (INDEPENDENT_AMBULATORY_CARE_PROVIDER_SITE_OTHER): Payer: PPO | Admitting: Cardiology

## 2017-11-09 VITALS — BP 120/86 | HR 64 | Ht 66.0 in | Wt 154.0 lb

## 2017-11-09 DIAGNOSIS — E785 Hyperlipidemia, unspecified: Secondary | ICD-10-CM | POA: Diagnosis not present

## 2017-11-09 DIAGNOSIS — I10 Essential (primary) hypertension: Secondary | ICD-10-CM | POA: Diagnosis not present

## 2017-11-09 DIAGNOSIS — R011 Cardiac murmur, unspecified: Secondary | ICD-10-CM

## 2017-11-09 DIAGNOSIS — R079 Chest pain, unspecified: Secondary | ICD-10-CM | POA: Diagnosis not present

## 2017-11-09 DIAGNOSIS — I2510 Atherosclerotic heart disease of native coronary artery without angina pectoris: Secondary | ICD-10-CM | POA: Diagnosis not present

## 2017-11-09 MED ORDER — METOPROLOL TARTRATE 25 MG PO TABS: 25.0000 mg | ORAL_TABLET | Freq: Two times a day (BID) | ORAL | 3 refills | Status: DC

## 2017-11-09 MED ORDER — ATORVASTATIN CALCIUM 80 MG PO TABS: 80.0000 mg | ORAL_TABLET | Freq: Every day | ORAL | 3 refills | Status: DC

## 2017-11-09 MED ORDER — LISINOPRIL 5 MG PO TABS: 5.0000 mg | ORAL_TABLET | Freq: Every morning | ORAL | 3 refills | Status: DC

## 2017-11-09 NOTE — Patient Instructions (Signed)
Medication Instructions:  Continue current medications  If you need a refill on your cardiac medications before your next appointment, please call your pharmacy.  Labwork: None Ordered  If you have labs (blood work) drawn today and your tests are completely normal, you will receive your results only by: [name] MyChart Message (if you have MyChart) OR . A paper copy in the mail If you have any lab test that is abnormal or we need to change your treatment, we will call you to review the results.  Testing/Procedures: None Ordered  Follow-Up: You will need a follow up appointment in 1 Year.  Please call our office 2 months in advance([phone]) to schedule the appointment.  You may see  DR Percival Spanish or one of the following Advanced Practice Providers on your designated Care Team:   . Rosaria Ferries, PA-C . Jory Sims, DNP, ANP  At [facility], you and your health needs are our priority.  As part of our continuing mission to provide you with exceptional heart care, we have created designated Provider Care Teams.  These Care Teams include your primary Cardiologist (physician) and Advanced Practice Providers (APPs -  Physician Assistants and Nurse Practitioners) who all work together to provide you with the care you need, when you need it.   Thank you for choosing [facility] at [facility]!!

## 2017-11-14 DIAGNOSIS — M1811 Unilateral primary osteoarthritis of first carpometacarpal joint, right hand: Secondary | ICD-10-CM | POA: Diagnosis not present

## 2017-11-26 DIAGNOSIS — R339 Retention of urine, unspecified: Secondary | ICD-10-CM | POA: Diagnosis not present

## 2017-11-27 DIAGNOSIS — N312 Flaccid neuropathic bladder, not elsewhere classified: Secondary | ICD-10-CM | POA: Diagnosis not present

## 2017-11-27 DIAGNOSIS — N3020 Other chronic cystitis without hematuria: Secondary | ICD-10-CM | POA: Diagnosis not present

## 2017-12-12 DIAGNOSIS — M1811 Unilateral primary osteoarthritis of first carpometacarpal joint, right hand: Secondary | ICD-10-CM | POA: Diagnosis not present

## 2018-01-01 DIAGNOSIS — R339 Retention of urine, unspecified: Secondary | ICD-10-CM | POA: Diagnosis not present

## 2018-01-15 DIAGNOSIS — H353132 Nonexudative age-related macular degeneration, bilateral, intermediate dry stage: Secondary | ICD-10-CM | POA: Diagnosis not present

## 2018-01-15 DIAGNOSIS — H353222 Exudative age-related macular degeneration, left eye, with inactive choroidal neovascularization: Secondary | ICD-10-CM | POA: Diagnosis not present

## 2018-01-15 DIAGNOSIS — H35722 Serous detachment of retinal pigment epithelium, left eye: Secondary | ICD-10-CM | POA: Diagnosis not present

## 2018-01-31 DIAGNOSIS — R339 Retention of urine, unspecified: Secondary | ICD-10-CM | POA: Diagnosis not present

## 2018-03-04 DIAGNOSIS — H35052 Retinal neovascularization, unspecified, left eye: Secondary | ICD-10-CM | POA: Diagnosis not present

## 2018-03-04 DIAGNOSIS — H353132 Nonexudative age-related macular degeneration, bilateral, intermediate dry stage: Secondary | ICD-10-CM | POA: Diagnosis not present

## 2018-03-04 DIAGNOSIS — H353222 Exudative age-related macular degeneration, left eye, with inactive choroidal neovascularization: Secondary | ICD-10-CM | POA: Diagnosis not present

## 2018-03-04 DIAGNOSIS — H35722 Serous detachment of retinal pigment epithelium, left eye: Secondary | ICD-10-CM | POA: Diagnosis not present

## 2018-03-06 DIAGNOSIS — R339 Retention of urine, unspecified: Secondary | ICD-10-CM | POA: Diagnosis not present

## 2018-03-25 DIAGNOSIS — N819 Female genital prolapse, unspecified: Secondary | ICD-10-CM | POA: Diagnosis not present

## 2018-03-25 DIAGNOSIS — N312 Flaccid neuropathic bladder, not elsewhere classified: Secondary | ICD-10-CM | POA: Diagnosis not present

## 2018-03-25 DIAGNOSIS — N3020 Other chronic cystitis without hematuria: Secondary | ICD-10-CM | POA: Diagnosis not present

## 2018-04-25 DIAGNOSIS — M810 Age-related osteoporosis without current pathological fracture: Secondary | ICD-10-CM | POA: Diagnosis not present

## 2018-04-30 DIAGNOSIS — M810 Age-related osteoporosis without current pathological fracture: Secondary | ICD-10-CM | POA: Diagnosis not present

## 2018-04-30 DIAGNOSIS — Z0000 Encounter for general adult medical examination without abnormal findings: Secondary | ICD-10-CM | POA: Diagnosis not present

## 2018-04-30 DIAGNOSIS — K219 Gastro-esophageal reflux disease without esophagitis: Secondary | ICD-10-CM | POA: Diagnosis not present

## 2018-04-30 DIAGNOSIS — I10 Essential (primary) hypertension: Secondary | ICD-10-CM | POA: Diagnosis not present

## 2018-04-30 DIAGNOSIS — E785 Hyperlipidemia, unspecified: Secondary | ICD-10-CM | POA: Diagnosis not present

## 2018-04-30 DIAGNOSIS — F419 Anxiety disorder, unspecified: Secondary | ICD-10-CM | POA: Diagnosis not present

## 2018-04-30 DIAGNOSIS — F5101 Primary insomnia: Secondary | ICD-10-CM | POA: Diagnosis not present

## 2018-05-02 DIAGNOSIS — R339 Retention of urine, unspecified: Secondary | ICD-10-CM | POA: Diagnosis not present

## 2018-05-31 DIAGNOSIS — R339 Retention of urine, unspecified: Secondary | ICD-10-CM | POA: Diagnosis not present

## 2018-07-01 DIAGNOSIS — R339 Retention of urine, unspecified: Secondary | ICD-10-CM | POA: Diagnosis not present

## 2018-07-08 DIAGNOSIS — H353222 Exudative age-related macular degeneration, left eye, with inactive choroidal neovascularization: Secondary | ICD-10-CM | POA: Diagnosis not present

## 2018-07-08 DIAGNOSIS — H35363 Drusen (degenerative) of macula, bilateral: Secondary | ICD-10-CM | POA: Diagnosis not present

## 2018-07-08 DIAGNOSIS — H35722 Serous detachment of retinal pigment epithelium, left eye: Secondary | ICD-10-CM | POA: Diagnosis not present

## 2018-07-08 DIAGNOSIS — H353132 Nonexudative age-related macular degeneration, bilateral, intermediate dry stage: Secondary | ICD-10-CM | POA: Diagnosis not present

## 2018-07-31 DIAGNOSIS — R339 Retention of urine, unspecified: Secondary | ICD-10-CM | POA: Diagnosis not present

## 2018-08-09 DIAGNOSIS — L821 Other seborrheic keratosis: Secondary | ICD-10-CM | POA: Diagnosis not present

## 2018-08-09 DIAGNOSIS — L570 Actinic keratosis: Secondary | ICD-10-CM | POA: Diagnosis not present

## 2018-08-09 DIAGNOSIS — Z85828 Personal history of other malignant neoplasm of skin: Secondary | ICD-10-CM | POA: Diagnosis not present

## 2018-08-09 DIAGNOSIS — D1801 Hemangioma of skin and subcutaneous tissue: Secondary | ICD-10-CM | POA: Diagnosis not present

## 2018-09-02 DIAGNOSIS — R339 Retention of urine, unspecified: Secondary | ICD-10-CM | POA: Diagnosis not present

## 2018-09-09 DIAGNOSIS — N3020 Other chronic cystitis without hematuria: Secondary | ICD-10-CM | POA: Diagnosis not present

## 2018-09-09 DIAGNOSIS — N819 Female genital prolapse, unspecified: Secondary | ICD-10-CM | POA: Diagnosis not present

## 2018-09-10 DIAGNOSIS — Z803 Family history of malignant neoplasm of breast: Secondary | ICD-10-CM | POA: Diagnosis not present

## 2018-09-10 DIAGNOSIS — Z1231 Encounter for screening mammogram for malignant neoplasm of breast: Secondary | ICD-10-CM | POA: Diagnosis not present

## 2018-10-02 DIAGNOSIS — R339 Retention of urine, unspecified: Secondary | ICD-10-CM | POA: Diagnosis not present

## 2018-10-14 DIAGNOSIS — H353222 Exudative age-related macular degeneration, left eye, with inactive choroidal neovascularization: Secondary | ICD-10-CM | POA: Diagnosis not present

## 2018-10-14 DIAGNOSIS — H353132 Nonexudative age-related macular degeneration, bilateral, intermediate dry stage: Secondary | ICD-10-CM | POA: Diagnosis not present

## 2018-10-14 DIAGNOSIS — H35722 Serous detachment of retinal pigment epithelium, left eye: Secondary | ICD-10-CM | POA: Diagnosis not present

## 2018-10-14 DIAGNOSIS — H43811 Vitreous degeneration, right eye: Secondary | ICD-10-CM | POA: Diagnosis not present

## 2018-10-28 DIAGNOSIS — M810 Age-related osteoporosis without current pathological fracture: Secondary | ICD-10-CM | POA: Diagnosis not present

## 2018-10-28 DIAGNOSIS — Z23 Encounter for immunization: Secondary | ICD-10-CM | POA: Diagnosis not present

## 2018-10-31 DIAGNOSIS — R339 Retention of urine, unspecified: Secondary | ICD-10-CM | POA: Diagnosis not present

## 2018-11-10 DIAGNOSIS — I422 Other hypertrophic cardiomyopathy: Secondary | ICD-10-CM | POA: Insufficient documentation

## 2018-11-10 DIAGNOSIS — I517 Cardiomegaly: Secondary | ICD-10-CM | POA: Insufficient documentation

## 2018-11-10 DIAGNOSIS — R072 Precordial pain: Secondary | ICD-10-CM | POA: Insufficient documentation

## 2018-11-10 NOTE — Progress Notes (Signed)
HPI The patient presents for evaluation of CAD.  She had PCI of an LAD ruptured plaque in 2014. At that time she presented with jaw pain.  In 2017 she was in the hospital with chest pain.   This was atypical and not felt to be anginal.  She had an echo which showed dynamic outflow obstruction with Valsalva with a peak gradient of 13 mm Hg.   I sent her for a POET (Plain Old Exercise Treadmill) which was negative.  She returns for follow up.   Since I last saw her she has done well.  The patient denies any new symptoms such as chest discomfort, neck or arm discomfort. There has been no new shortness of breath, PND or orthopnea. There have been no reported palpitations, presyncope or syncope.   Allergies  Allergen Reactions  . Estrogens     Breast pain  . Pneumococcal Vaccines     rash  . Bee Venom Rash  . Lexapro [Escitalopram Oxalate] Rash    edema  . Sulfa Antibiotics Rash    Current Outpatient Medications  Medication Sig Dispense Refill  . ALPRAZolam (XANAX) 0.5 MG tablet Take 0.5 mg by mouth daily.     . Ascorbic Acid (VITAMIN C) 1000 MG tablet Take 1,000 mg by mouth daily.    [name] aspirin 81 MG tablet Take 81 mg by mouth daily.    [name] atorvastatin (LIPITOR) 80 MG tablet Take 1 tablet (80 mg total) by mouth daily. 90 tablet 3  . cholecalciferol (VITAMIN D) 1000 UNITS tablet Take 1,000 Units by mouth daily.     . Cranberry (ELLURA PO) Take 1 tablet by mouth daily.     [name] denosumab (PROLIA) 60 MG/ML SOSY injection Inject 60 mg into the skin every 6 (six) months.    [name] lisinopril (ZESTRIL) 5 MG tablet Take 1 tablet (5 mg total) by mouth every morning. 90 tablet 3  . metoprolol tartrate (LOPRESSOR) 25 MG tablet Take 1 tablet (25 mg total) by mouth 2 (two) times daily. 180 tablet 3  . Multiple Vitamins-Minerals (ICAPS) CAPS Take 1 capsule by mouth every morning.    . nitroGLYCERIN (NITROSTAT) 0.4 MG SL tablet 1 TABLET UNDER THE TONGUE EVERY 5 MINUTES FOR UP TO 3 DOSES AS NEEDEDFOR CHEST  PAIN. 25 tablet 3  . Omega-3 Fatty Acids (FISH OIL) 1000 MG CAPS Take 1 capsule by mouth daily.     No current facility-administered medications for this visit.     Past Medical History:  Diagnosis Date  . Anxiety   . Bilateral bunions   . CAD in native artery    a. NSTEMI 2/14 => LHC 03/08/12: Mid LAD 95% (hazy-suggestive of a ruptured plaque), EF 40%, distal anterior, apical and distal inferior HK =>  PCI: Xience DES to the mid LAD.  [name] Cardiomyopathy ([place])    LVEF 45% on cath/echo 03/2012, suspected to be ischemic vs stress-induced  . Depression   . Diverticulosis   . Fracture 2013   Right foot fracture- did not require surgery  . Hyperlipidemia   . Hypertension   . Macular degeneration   . Macular degeneration   . Mild mitral regurgitation    a. Echo 03/09/12: Mild focal basal hypertrophy the septum, EF 45-50%, distal anteroseptal, anterolateral, inferolateral, inferoseptal and apical HK, grade 2 diastolic dysfunction, mild MR, PASP 52  . Osteoporosis   . Right knee meniscal tear 07/2016  . Self-catheterizes urinary bladder   . UTI (lower urinary tract  infection)     Past Surgical History:  Procedure Laterality Date  . ABDOMINAL HYSTERECTOMY  1980  . BACK SURGERY  1986  . CARPAL TUNNEL RELEASE Right MAy 2015  . CORONARY ANGIOPLASTY WITH STENT PLACEMENT  03/11/12   95% mid LAD stenosis s/p DES, minimal nonobs dz elsewhere; LVEF 40%, moderate hypokinesis of distal anterior, apical and distal inferior walls, mildly elevated filling pressures, mild pulmonary HTN  . LEFT HEART CATHETERIZATION WITH CORONARY ANGIOGRAM N/A 03/11/2012   Procedure: LEFT HEART CATHETERIZATION WITH CORONARY ANGIOGRAM;  Surgeon: Wellington Hampshire, MD;  Location: [facility] CATH LAB;  Service: Cardiovascular;  Laterality: N/A;  . reclast  2013    ROS:  As stated in the HPI and negative for all other systems.  PHYSICAL EXAM BP 135/83   Pulse 67   Temp (!) 96.6 F (35.9 C)   Ht 5\' 6"  (1.676 m)   Wt 162 lb 6.4 oz  (73.7 kg)   SpO2 95%   BMI 26.21 kg/m   GENERAL:  Well appearing NECK:  No jugular venous distention, waveform within normal limits, carotid upstroke brisk and symmetric, no bruits, no thyromegaly LUNGS:  Clear to auscultation bilaterally CHEST:  Unremarkable HEART:  PMI not displaced or sustained,S1 and S2 within normal limits, no S3, no S4, no clicks, no rubs, no murmurs ABD:  Flat, positive bowel sounds normal in frequency in pitch, no bruits, no rebound, no guarding, no midline pulsatile mass, no hepatomegaly, no splenomegaly EXT:  2 plus pulses throughout, no edema, no cyanosis no clubbing   EKG: Sinus rhythm, rate 63, axis within normal limits, intervals within normal limits, no acute ST-T wave changes.  11/11/2018   ASSESSMENT AND PLAN  CAD:  She had a negative POET (Plain Old Exercise Treadmill) in June of 2017.    The patient has no new sypmtoms.  No further cardiovascular testing is indicated.  We will continue with aggressive risk reduction and meds as listed.  HTN:  The blood pressure is at target.  No change in therapy.   HYPERLIPIDEMIA:      Her LDL in 63.  I will repeat this today.    MURMUR: She had a slight subvalvular gradient in the past but no symptoms.  No further imaging is indicated.

## 2018-11-11 ENCOUNTER — Ambulatory Visit: Payer: PPO | Admitting: Cardiology

## 2018-11-11 ENCOUNTER — Encounter: Payer: Self-pay | Admitting: Cardiology

## 2018-11-11 ENCOUNTER — Other Ambulatory Visit: Payer: Self-pay

## 2018-11-11 VITALS — BP 135/83 | HR 67 | Temp 96.6°F | Ht 66.0 in | Wt 162.4 lb

## 2018-11-11 DIAGNOSIS — E785 Hyperlipidemia, unspecified: Secondary | ICD-10-CM | POA: Diagnosis not present

## 2018-11-11 DIAGNOSIS — R072 Precordial pain: Secondary | ICD-10-CM | POA: Diagnosis not present

## 2018-11-11 DIAGNOSIS — I422 Other hypertrophic cardiomyopathy: Secondary | ICD-10-CM

## 2018-11-11 DIAGNOSIS — I10 Essential (primary) hypertension: Secondary | ICD-10-CM

## 2018-11-11 MED ORDER — METOPROLOL TARTRATE 25 MG PO TABS: 25.0000 mg | ORAL_TABLET | Freq: Two times a day (BID) | ORAL | 3 refills | Status: DC

## 2018-11-11 MED ORDER — LISINOPRIL 5 MG PO TABS: 5.0000 mg | ORAL_TABLET | Freq: Every morning | ORAL | 3 refills | Status: DC

## 2018-11-11 MED ORDER — NITROGLYCERIN 0.4 MG SL SUBL: SUBLINGUAL_TABLET | SUBLINGUAL | 3 refills | Status: DC

## 2018-11-11 NOTE — Patient Instructions (Signed)
Medication Instructions:  Your physician recommends that you continue on your current medications as directed. Please refer to the Current Medication list given to you today.  If you need a refill on your cardiac medications before your next appointment, please call your pharmacy.   Lab work: CMET, CBC & Lipids If you have labs (blood work) drawn today and your tests are completely normal, you will receive your results only by: [place] (if you have MyChart) OR A paper copy in the mail If you have any lab test that is abnormal or we need to change your treatment, we will call you to review the results.  Testing/Procedures: NONE  Follow-Up: At [facility], you and your health needs are our priority.  As part of our continuing mission to provide you with exceptional heart care, we have created designated Provider Care Teams.  These Care Teams include your primary Cardiologist (physician) and Advanced Practice Providers (APPs -  Physician Assistants and Nurse Practitioners) who all work together to provide you with the care you need, when you need it. You will need a follow up appointment in 12 months.  Please call our office 2 months in advance to schedule this appointment.  You may see Dr. Percival Spanish or one of the following Advanced Practice Providers on your designated Care Team:   Rosaria Ferries, PA-C Jory Sims, DNP, ANP

## 2018-11-16 ENCOUNTER — Other Ambulatory Visit: Payer: Self-pay | Admitting: Cardiology

## 2018-11-18 DIAGNOSIS — I422 Other hypertrophic cardiomyopathy: Secondary | ICD-10-CM | POA: Diagnosis not present

## 2018-11-18 DIAGNOSIS — I10 Essential (primary) hypertension: Secondary | ICD-10-CM | POA: Diagnosis not present

## 2018-11-18 DIAGNOSIS — R072 Precordial pain: Secondary | ICD-10-CM | POA: Diagnosis not present

## 2018-11-18 DIAGNOSIS — E785 Hyperlipidemia, unspecified: Secondary | ICD-10-CM | POA: Diagnosis not present

## 2018-11-18 LAB — COMPREHENSIVE METABOLIC PANEL
ALT: 20 IU/L (ref 0–32)
AST: 24 IU/L (ref 0–40)
Albumin/Globulin Ratio: 1.5 (ref 1.2–2.2)
Albumin: 4.3 g/dL (ref 3.6–4.6)
Alkaline Phosphatase: 60 IU/L (ref 39–117)
BUN/Creatinine Ratio: 21 (ref 12–28)
BUN: 22 mg/dL (ref 8–27)
Bilirubin Total: 0.4 mg/dL (ref 0.0–1.2)
CO2: 23 mmol/L (ref 20–29)
Calcium: 10.1 mg/dL (ref 8.7–10.3)
Chloride: 101 mmol/L (ref 96–106)
Creatinine, Ser: 1.03 mg/dL — ABNORMAL HIGH (ref 0.57–1.00)
GFR calc Af Amer: 58 mL/min/{1.73_m2} — ABNORMAL LOW (ref >59)
GFR calc non Af Amer: 50 mL/min/{1.73_m2} — ABNORMAL LOW (ref >59)
Globulin, Total: 2.9 g/dL (ref 1.5–4.5)
Glucose: 88 mg/dL (ref 65–99)
Potassium: 4.5 mmol/L (ref 3.5–5.2)
Sodium: 139 mmol/L (ref 134–144)
Total Protein: 7.2 g/dL (ref 6.0–8.5)

## 2018-11-18 LAB — CBC
Hematocrit: 40.9 % (ref 34.0–46.6)
Hemoglobin: 14.2 g/dL (ref 11.1–15.9)
MCH: 32.9 pg (ref 26.6–33.0)
MCHC: 34.7 g/dL (ref 31.5–35.7)
MCV: 95 fL (ref 79–97)
Platelets: 179 10*3/uL (ref 150–450)
RBC: 4.31 x10E6/uL (ref 3.77–5.28)
RDW: 12.2 % (ref 11.7–15.4)
WBC: 6.8 10*3/uL (ref 3.4–10.8)

## 2018-11-18 LAB — LIPID PANEL
Chol/HDL Ratio: 2.2 ratio (ref 0.0–4.4)
Cholesterol, Total: 123 mg/dL (ref 100–199)
HDL: 56 mg/dL (ref >39)
LDL Chol Calc (NIH): 47 mg/dL (ref 0–99)
Triglycerides: 108 mg/dL (ref 0–149)
VLDL Cholesterol Cal: 20 mg/dL (ref 5–40)

## 2018-11-22 DIAGNOSIS — N3020 Other chronic cystitis without hematuria: Secondary | ICD-10-CM | POA: Diagnosis not present

## 2018-12-03 DIAGNOSIS — R339 Retention of urine, unspecified: Secondary | ICD-10-CM | POA: Diagnosis not present

## 2018-12-16 DIAGNOSIS — Z23 Encounter for immunization: Secondary | ICD-10-CM | POA: Diagnosis not present

## 2018-12-31 DIAGNOSIS — R339 Retention of urine, unspecified: Secondary | ICD-10-CM | POA: Diagnosis not present

## 2019-01-13 DIAGNOSIS — H353222 Exudative age-related macular degeneration, left eye, with inactive choroidal neovascularization: Secondary | ICD-10-CM | POA: Diagnosis not present

## 2019-01-13 DIAGNOSIS — H353132 Nonexudative age-related macular degeneration, bilateral, intermediate dry stage: Secondary | ICD-10-CM | POA: Diagnosis not present

## 2019-01-13 DIAGNOSIS — H35052 Retinal neovascularization, unspecified, left eye: Secondary | ICD-10-CM | POA: Diagnosis not present

## 2019-01-13 DIAGNOSIS — H35722 Serous detachment of retinal pigment epithelium, left eye: Secondary | ICD-10-CM | POA: Diagnosis not present

## 2019-02-03 DIAGNOSIS — R339 Retention of urine, unspecified: Secondary | ICD-10-CM | POA: Diagnosis not present

## 2019-03-01 ENCOUNTER — Other Ambulatory Visit: Payer: Self-pay | Admitting: Cardiology

## 2019-03-06 DIAGNOSIS — R339 Retention of urine, unspecified: Secondary | ICD-10-CM | POA: Diagnosis not present

## 2019-04-02 DIAGNOSIS — R339 Retention of urine, unspecified: Secondary | ICD-10-CM | POA: Diagnosis not present

## 2019-04-28 DIAGNOSIS — M810 Age-related osteoporosis without current pathological fracture: Secondary | ICD-10-CM | POA: Diagnosis not present

## 2019-05-05 DIAGNOSIS — R339 Retention of urine, unspecified: Secondary | ICD-10-CM | POA: Diagnosis not present

## 2019-05-15 ENCOUNTER — Encounter (INDEPENDENT_AMBULATORY_CARE_PROVIDER_SITE_OTHER): Payer: Self-pay | Admitting: Ophthalmology

## 2019-05-15 ENCOUNTER — Other Ambulatory Visit: Payer: Self-pay

## 2019-05-15 ENCOUNTER — Ambulatory Visit (INDEPENDENT_AMBULATORY_CARE_PROVIDER_SITE_OTHER): Payer: PPO | Admitting: Ophthalmology

## 2019-05-15 DIAGNOSIS — H353222 Exudative age-related macular degeneration, left eye, with inactive choroidal neovascularization: Secondary | ICD-10-CM | POA: Diagnosis not present

## 2019-05-15 DIAGNOSIS — H35722 Serous detachment of retinal pigment epithelium, left eye: Secondary | ICD-10-CM | POA: Diagnosis not present

## 2019-05-15 DIAGNOSIS — H353132 Nonexudative age-related macular degeneration, bilateral, intermediate dry stage: Secondary | ICD-10-CM | POA: Diagnosis not present

## 2019-05-15 NOTE — Assessment & Plan Note (Signed)

## 2019-05-15 NOTE — Progress Notes (Signed)
05/15/2019     CHIEF COMPLAINT Patient presents for Retina Follow Up   HISTORY OF PRESENT ILLNESS: Erin Roth is a 86 y.o. female who presents to the clinic today for:   HPI    Retina Follow Up    Patient presents with  Dry AMD.  In both eyes.  Severity is moderate.  Since onset it is stable.  I, the attending physician,  performed the HPI with the patient and updated documentation appropriately.          Comments    4 Month Dry AMD f\u OU. OCT and history of wet ARMD in the past OS, resolved status post combo therapy.  Pt states vision is stable per symptoms. Denies FOL and floaters.       Last edited by Hurman Horn, MD on 05/15/2019  1:58 PM. (History)      Referring physician: [name], MD [place],  [place] [id number]  HISTORICAL INFORMATION:   Selected notes from the [place]: No current outpatient medications on file. (Ophthalmic Drugs)   No current facility-administered medications for this visit. (Ophthalmic Drugs)   Current Outpatient Medications (Other)  Medication Sig  . Ascorbic Acid (VITAMIN C) 1000 MG tablet Take 1,000 mg by mouth daily.  [name] aspirin 81 MG tablet Take 81 mg by mouth daily.  [name] atorvastatin (LIPITOR) 80 MG tablet take 1 tablet by mouth once daily  . cholecalciferol (VITAMIN D) 1000 UNITS tablet Take 1,000 Units by mouth daily.   [name] denosumab (PROLIA) 60 MG/ML SOSY injection Inject 60 mg into the skin every 6 (six) months.  [name] lisinopril (ZESTRIL) 5 MG tablet Take 1 tablet (5 mg total) by mouth every morning.  . metoprolol tartrate (LOPRESSOR) 25 MG tablet Take 1 tablet (25 mg total) by mouth 2 (two) times daily.  . Multiple Vitamins-Minerals (ICAPS) CAPS Take 1 capsule by mouth every morning.  [name] SHINGRIX injection   . ALPRAZolam (XANAX) 0.5 MG tablet Take 0.5 mg by mouth daily.   . Cranberry (ELLURA PO) Take 1 tablet by mouth daily.   . nitroGLYCERIN (NITROSTAT)  0.4 MG SL tablet 1 TABLET UNDER THE TONGUE EVERY 5 MINUTES FOR UP TO 3 DOSES AS NEEDEDFOR CHEST PAIN.  [name] Omega-3 Fatty Acids (FISH OIL) 1000 MG CAPS Take 1 capsule by mouth daily.   No current facility-administered medications for this visit. (Other)      REVIEW OF SYSTEMS:    ALLERGIES Allergies  Allergen Reactions  . Estrogens     Breast pain  . Pneumococcal Vaccines     rash  . Bee Venom Rash  . Lexapro [Escitalopram Oxalate] Rash    edema  . Sulfa Antibiotics Rash    PAST MEDICAL HISTORY Past Medical History:  Diagnosis Date  . Anxiety   . Bilateral bunions   . CAD in native artery    a. NSTEMI 2/14 => LHC 03/08/12: Mid LAD 95% (hazy-suggestive of a ruptured plaque), EF 40%, distal anterior, apical and distal inferior HK =>  PCI: Xience DES to the mid LAD.  [name] Cardiomyopathy ([place])    LVEF 45% on cath/echo 03/2012, suspected to be ischemic vs stress-induced  . Depression   . Diverticulosis   . Fracture 2013   Right foot fracture- did not require surgery  . Hyperlipidemia   . Hypertension   . Macular degeneration   . Macular degeneration   . Mild  mitral regurgitation    a. Echo 03/09/12: Mild focal basal hypertrophy the septum, EF 45-50%, distal anteroseptal, anterolateral, inferolateral, inferoseptal and apical HK, grade 2 diastolic dysfunction, mild MR, PASP 52  . Osteoporosis   . Right knee meniscal tear 07/2016  . Self-catheterizes urinary bladder   . UTI (lower urinary tract infection)    Past Surgical History:  Procedure Laterality Date  . ABDOMINAL HYSTERECTOMY  1980  . BACK SURGERY  1986  . CARPAL TUNNEL RELEASE Right MAy 2015  . CATARACT EXTRACTION W/PHACO Left 12/15/2014   Dr. Kathrin Penner  . CATARACT EXTRACTION W/PHACO Right 02/14/2015   Dr. Kathrin Penner  . CORONARY ANGIOPLASTY WITH STENT PLACEMENT  03/11/12   95% mid LAD stenosis s/p DES, minimal nonobs dz elsewhere; LVEF 40%, moderate hypokinesis of distal anterior, apical and distal inferior walls,  mildly elevated filling pressures, mild pulmonary HTN  . LEFT HEART CATHETERIZATION WITH CORONARY ANGIOGRAM N/A 03/11/2012   Procedure: LEFT HEART CATHETERIZATION WITH CORONARY ANGIOGRAM;  Surgeon: Wellington Hampshire, MD;  Location: [facility] CATH LAB;  Service: Cardiovascular;  Laterality: N/A;  . reclast  2013    FAMILY HISTORY Family History  Problem Relation Age of Onset  . Colon cancer Father   . Hypertension Mother   . CAD Mother   . Heart attack Mother   . CAD Brother 68       CABG  . Cancer - Lung Brother   . Cancer - Lung Sister   . Hypertension Paternal Grandmother   . Breast cancer Paternal Grandfather     SOCIAL HISTORY Social History   Tobacco Use  . Smoking status: Never Smoker  . Smokeless tobacco: Never Used  Substance Use Topics  . Alcohol use: Yes    Comment: 4 glasses of wine per week  . Drug use: No         OPHTHALMIC EXAM:  Base Eye Exam    Visual Acuity (Snellen - Linear)      Right Left   Dist cc 20/20 20/30 -2   Dist ph cc  20/25 -2   Correction: Glasses       Tonometry (Tonopen, 1:26 PM)      Right Left   Pressure 9 9       Pupils      Pupils Dark Light Shape React APD   Right PERRL 3 2 Round Brisk None   Left PERRL 3 2 Round Slow None       Visual Fields (Counting fingers)      Left Right    Full Full       Neuro/Psych    Oriented x3: Yes   Mood/Affect: Normal       Dilation    Both eyes: 1.0% Mydriacyl, 2.5% Phenylephrine @ 1:27 PM        Slit Lamp and Fundus Exam    External Exam      Right Left   External Normal Normal       Slit Lamp Exam      Right Left   Lids/Lashes Normal Normal   Conjunctiva/Sclera White and quiet White and quiet   Cornea Clear Clear   Anterior Chamber Deep and quiet Deep and quiet   Iris Round and reactive Round and reactive   Lens Posterior chamber intraocular lens Posterior chamber intraocular lens   Anterior Vitreous Normal Normal       Fundus Exam      Right Left   Posterior  Vitreous Central vitreous floaters, Posterior vitreous  detachment Central vitreous floaters, Posterior vitreous detachment   Disc Normal Normal   C/D Ratio 0.45 0.4   Macula Soft drusen, no macular thickening, no exudates Soft drusen, no macular thickening, no exudates   Vessels Normal Normal   Periphery Normal Normal          IMAGING AND PROCEDURES  Imaging and Procedures for @TODAY @  OCT, Retina - OU - Both Eyes       Right Eye Quality was good. Scan locations included subfoveal. Progression has been stable. Findings include no SRF, no IRF, normal observations, retinal drusen .   Left Eye Quality was good. Scan locations included subfoveal. Progression has been stable. Findings include subretinal fluid, retinal drusen .   Notes   OD, dry age-related macular degeneration with no evidence of CN VM  OS with overall dry age-related macular degeneration, chronic subfoveal fluid, no interval change from December 2020                    ASSESSMENT/PLAN:  No problem-specific Assessment & Plan notes found for this encounter.      ICD-10-CM   1. Exudative age-related macular degeneration of left eye with inactive choroidal neovascularization (HCC)  H35.3222 OCT, Retina - OU - Both Eyes  2. Serous detachment of retinal pigment epithelium of left eye  H35.722 OCT, Retina - OU - Both Eyes  3. Intermediate stage nonexudative age-related macular degeneration of both eyes  H35.3132     1.  CN VM in the left eye is now inactive for some time.  2.  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  Stay on oral AREDS 2 supplements  Ophthalmic Meds Ordered this visit:  No orders of the defined types were placed in this encounter.      No follow-ups on file.  There are no Patient Instructions on file for this visit.   Explained the diagnoses, plan, and follow up with the patient and they expressed understanding.  Patient expressed  understanding of the importance of proper follow up care.   Clent Demark Kammy Klett M.D. Diseases & Surgery of the Retina and Vitreous Retina & Diabetic [place] @TODAY @     Abbreviations: M myopia (nearsighted); A astigmatism; H hyperopia (farsighted); P presbyopia; Mrx spectacle prescription;  CTL contact lenses; OD right eye; OS left eye; OU both eyes  XT exotropia; ET esotropia; PEK punctate epithelial keratitis; PEE punctate epithelial erosions; DES dry eye syndrome; MGD meibomian gland dysfunction; ATs artificial tears; PFAT's preservative free artificial tears; [place] nuclear sclerotic cataract; PSC posterior subcapsular cataract; ERM epi-retinal membrane; PVD posterior vitreous detachment; RD retinal detachment; DM diabetes mellitus; DR diabetic retinopathy; NPDR non-proliferative diabetic retinopathy; PDR proliferative diabetic retinopathy; CSME clinically significant macular edema; DME diabetic macular edema; dbh dot blot hemorrhages; CWS cotton wool spot; POAG primary open angle glaucoma; C/D cup-to-disc ratio; HVF humphrey visual field; GVF goldmann visual field; OCT optical coherence tomography; IOP intraocular pressure; BRVO Branch retinal vein occlusion; CRVO central retinal vein occlusion; CRAO central retinal artery occlusion; BRAO branch retinal artery occlusion; RT retinal tear; SB scleral buckle; PPV pars plana vitrectomy; VH Vitreous hemorrhage; PRP panretinal laser photocoagulation; IVK intravitreal kenalog; VMT vitreomacular traction; MH Macular hole;  NVD neovascularization of the disc; NVE neovascularization elsewhere; AREDS age related eye disease study; ARMD age related macular degeneration; POAG primary open angle glaucoma; EBMD epithelial/anterior basement membrane dystrophy; ACIOL anterior chamber intraocular lens; IOL intraocular lens; PCIOL posterior chamber  intraocular lens; Phaco/IOL phacoemulsification with intraocular lens placement; [place] photorefractive keratectomy; LASIK laser  assisted in situ keratomileusis; HTN hypertension; DM diabetes mellitus; COPD chronic obstructive pulmonary disease

## 2019-06-04 DIAGNOSIS — R339 Retention of urine, unspecified: Secondary | ICD-10-CM | POA: Diagnosis not present

## 2019-06-26 DIAGNOSIS — S1096XA Insect bite of unspecified part of neck, initial encounter: Secondary | ICD-10-CM | POA: Diagnosis not present

## 2019-06-26 DIAGNOSIS — F5101 Primary insomnia: Secondary | ICD-10-CM | POA: Diagnosis not present

## 2019-06-26 DIAGNOSIS — W57XXXA Bitten or stung by nonvenomous insect and other nonvenomous arthropods, initial encounter: Secondary | ICD-10-CM | POA: Diagnosis not present

## 2019-07-07 DIAGNOSIS — R339 Retention of urine, unspecified: Secondary | ICD-10-CM | POA: Diagnosis not present

## 2019-08-06 DIAGNOSIS — R339 Retention of urine, unspecified: Secondary | ICD-10-CM | POA: Diagnosis not present

## 2019-08-06 DIAGNOSIS — Z85828 Personal history of other malignant neoplasm of skin: Secondary | ICD-10-CM | POA: Diagnosis not present

## 2019-08-06 DIAGNOSIS — L570 Actinic keratosis: Secondary | ICD-10-CM | POA: Diagnosis not present

## 2019-08-06 DIAGNOSIS — L821 Other seborrheic keratosis: Secondary | ICD-10-CM | POA: Diagnosis not present

## 2019-08-06 DIAGNOSIS — L820 Inflamed seborrheic keratosis: Secondary | ICD-10-CM | POA: Diagnosis not present

## 2019-08-20 DIAGNOSIS — G479 Sleep disorder, unspecified: Secondary | ICD-10-CM | POA: Diagnosis not present

## 2019-08-20 DIAGNOSIS — I952 Hypotension due to drugs: Secondary | ICD-10-CM | POA: Diagnosis not present

## 2019-08-20 DIAGNOSIS — I10 Essential (primary) hypertension: Secondary | ICD-10-CM | POA: Diagnosis not present

## 2019-08-26 DIAGNOSIS — I951 Orthostatic hypotension: Secondary | ICD-10-CM | POA: Diagnosis not present

## 2019-08-26 DIAGNOSIS — I10 Essential (primary) hypertension: Secondary | ICD-10-CM | POA: Diagnosis not present

## 2019-08-27 DIAGNOSIS — R300 Dysuria: Secondary | ICD-10-CM | POA: Diagnosis not present

## 2019-09-05 DIAGNOSIS — R339 Retention of urine, unspecified: Secondary | ICD-10-CM | POA: Diagnosis not present

## 2019-09-09 DIAGNOSIS — I951 Orthostatic hypotension: Secondary | ICD-10-CM | POA: Diagnosis not present

## 2019-09-15 ENCOUNTER — Ambulatory Visit (INDEPENDENT_AMBULATORY_CARE_PROVIDER_SITE_OTHER): Payer: PPO | Admitting: Ophthalmology

## 2019-09-15 ENCOUNTER — Encounter (INDEPENDENT_AMBULATORY_CARE_PROVIDER_SITE_OTHER): Payer: Self-pay | Admitting: Ophthalmology

## 2019-09-15 ENCOUNTER — Other Ambulatory Visit: Payer: Self-pay

## 2019-09-15 DIAGNOSIS — H353222 Exudative age-related macular degeneration, left eye, with inactive choroidal neovascularization: Secondary | ICD-10-CM | POA: Diagnosis not present

## 2019-09-15 DIAGNOSIS — H35722 Serous detachment of retinal pigment epithelium, left eye: Secondary | ICD-10-CM

## 2019-09-15 DIAGNOSIS — H353132 Nonexudative age-related macular degeneration, bilateral, intermediate dry stage: Secondary | ICD-10-CM

## 2019-09-15 NOTE — Progress Notes (Signed)
09/15/2019     CHIEF COMPLAINT Patient presents for Retina Follow Up   HISTORY OF PRESENT ILLNESS: Erin Roth is a 86 y.o. female who presents to the clinic today for:   HPI    Retina Follow Up    Patient presents with  Wet AMD.  In both eyes.  Severity is moderate.  Duration of 4 months.  Since onset it is stable.  I, the attending physician,  performed the HPI with the patient and updated documentation appropriately.          Comments    4 Month Wet AMD f\u OU. OCT  Pt states no changes in vision. Denies any complaints.       Last edited by Tilda Franco on 09/15/2019 12:51 PM. (History)      Referring physician: [name], MD [address],  [place] [address]  HISTORICAL INFORMATION:   Selected notes from the [place]: No current outpatient medications on file. (Ophthalmic Drugs)   No current facility-administered medications for this visit. (Ophthalmic Drugs)   Current Outpatient Medications (Other)  Medication Sig  . ALPRAZolam (XANAX) 0.5 MG tablet Take 0.5 mg by mouth daily.   . Ascorbic Acid (VITAMIN C) 1000 MG tablet Take 1,000 mg by mouth daily.  [name] aspirin 81 MG tablet Take 81 mg by mouth daily.  [name] atorvastatin (LIPITOR) 80 MG tablet take 1 tablet by mouth once daily  . cholecalciferol (VITAMIN D) 1000 UNITS tablet Take 1,000 Units by mouth daily.   . Cranberry (ELLURA PO) Take 1 tablet by mouth daily.   [name] denosumab (PROLIA) 60 MG/ML SOSY injection Inject 60 mg into the skin every 6 (six) months.  [name] lisinopril (ZESTRIL) 5 MG tablet Take 1 tablet (5 mg total) by mouth every morning.  . metoprolol tartrate (LOPRESSOR) 25 MG tablet Take 1 tablet (25 mg total) by mouth 2 (two) times daily.  . Multiple Vitamins-Minerals (ICAPS) CAPS Take 1 capsule by mouth every morning.  . nitroGLYCERIN (NITROSTAT) 0.4 MG SL tablet 1 TABLET UNDER THE TONGUE EVERY 5 MINUTES FOR UP TO 3 DOSES AS NEEDEDFOR  CHEST PAIN.  [name] Omega-3 Fatty Acids (FISH OIL) 1000 MG CAPS Take 1 capsule by mouth daily.  [name] SHINGRIX injection    No current facility-administered medications for this visit. (Other)      REVIEW OF SYSTEMS:    ALLERGIES Allergies  Allergen Reactions  . Estrogens     Breast pain  . Pneumococcal Vaccines     rash  . Bee Venom Rash  . Lexapro [Escitalopram Oxalate] Rash    edema  . Sulfa Antibiotics Rash    PAST MEDICAL HISTORY Past Medical History:  Diagnosis Date  . Anxiety   . Bilateral bunions   . CAD in native artery    a. NSTEMI 2/14 => LHC 03/08/12: Mid LAD 95% (hazy-suggestive of a ruptured plaque), EF 40%, distal anterior, apical and distal inferior HK =>  PCI: Xience DES to the mid LAD.  [name] Cardiomyopathy ([place])    LVEF 45% on cath/echo 03/2012, suspected to be ischemic vs stress-induced  . Depression   . Diverticulosis   . Fracture 2013   Right foot fracture- did not require surgery  . Hyperlipidemia   . Hypertension   . Macular degeneration   . Macular degeneration   . Mild mitral regurgitation    a. Echo 03/09/12: Mild focal basal hypertrophy the  septum, EF 45-50%, distal anteroseptal, anterolateral, inferolateral, inferoseptal and apical HK, grade 2 diastolic dysfunction, mild MR, PASP 52  . Osteoporosis   . Right knee meniscal tear 07/2016  . Self-catheterizes urinary bladder   . UTI (lower urinary tract infection)    Past Surgical History:  Procedure Laterality Date  . ABDOMINAL HYSTERECTOMY  1980  . BACK SURGERY  1986  . CARPAL TUNNEL RELEASE Right MAy 2015  . CATARACT EXTRACTION W/PHACO Left 12/15/2014   Dr. Kathrin Penner  . CATARACT EXTRACTION W/PHACO Right 02/14/2015   Dr. Kathrin Penner  . CORONARY ANGIOPLASTY WITH STENT PLACEMENT  03/11/12   95% mid LAD stenosis s/p DES, minimal nonobs dz elsewhere; LVEF 40%, moderate hypokinesis of distal anterior, apical and distal inferior walls, mildly elevated filling pressures, mild pulmonary HTN  . LEFT HEART  CATHETERIZATION WITH CORONARY ANGIOGRAM N/A 03/11/2012   Procedure: LEFT HEART CATHETERIZATION WITH CORONARY ANGIOGRAM;  Surgeon: Wellington Hampshire, MD;  Location: [facility] CATH [facility];  Service: Cardiovascular;  Laterality: N/A;  . reclast  2013    FAMILY HISTORY Family History  Problem Relation Age of Onset  . Colon cancer Father   . Hypertension Mother   . CAD Mother   . Heart attack Mother   . CAD Brother 64       CABG  . Cancer - Lung Brother   . Cancer - Lung Sister   . Hypertension Paternal Grandmother   . Breast cancer Paternal Grandfather     SOCIAL HISTORY Social History   Tobacco Use  . Smoking status: Never Smoker  . Smokeless tobacco: Never Used  Vaping Use  . Vaping Use: Never used  Substance Use Topics  . Alcohol use: Yes    Comment: 4 glasses of wine per week  . Drug use: No         OPHTHALMIC EXAM:  Base Eye Exam    Visual Acuity (Snellen - Linear)      Right Left   Dist cc 20/25 -2 20/25   Correction: Glasses       Tonometry (Tonopen, 12:54 PM)      Right Left   Pressure 12 10       Pupils      Pupils Dark Light Shape React APD   Right PERRL 3 2 Round Brisk None   Left PERRL 3 2 Round Slow None       Visual Fields (Counting fingers)      Left Right    Full Full       Neuro/Psych    Oriented x3: Yes   Mood/Affect: Normal       Dilation    Both eyes: 1.0% Mydriacyl, 2.5% Phenylephrine @ 12:54 PM        Slit Lamp and Fundus Exam    External Exam      Right Left   External Normal Normal       Slit Lamp Exam      Right Left   Lids/Lashes Normal Normal   Conjunctiva/Sclera White and quiet White and quiet   Cornea Clear Clear   Anterior Chamber Deep and quiet Deep and quiet   Iris Round and reactive Round and reactive   Lens Posterior chamber intraocular lens Posterior chamber intraocular lens   Anterior Vitreous Normal Normal       Fundus Exam      Right Left   Posterior Vitreous Central vitreous floaters, Posterior vitreous  detachment Central vitreous floaters, Posterior vitreous detachment   Disc Normal Normal  C/D Ratio 0.45 0.3   Macula Soft drusen, no macular thickening, no exudates Soft drusen, no macular thickening, no exudates   Vessels Normal Normal   Periphery Normal Normal          IMAGING AND PROCEDURES  Imaging and Procedures for 09/15/19  OCT, Retina - OU - Both Eyes       Right Eye Quality was good. Scan locations included subfoveal. Central Foveal Thickness: 278. Progression has been stable. Findings include retinal drusen , no SRF.   Left Eye Quality was good. Central Foveal Thickness: 247. Progression has improved. Findings include no SRF, retinal drusen .   Notes Small subfoveal serous retinal detachment left eye has diminished as compared to December 2020, no signs of CNVM OU                ASSESSMENT/PLAN:  Serous detachment of retinal pigment epithelium of left eye This condition present last in December 2020, has resolved again on no therapy and observation alone      ICD-10-CM   1. Exudative age-related macular degeneration of left eye with inactive choroidal neovascularization (HCC)  H35.3222 OCT, Retina - OU - Both Eyes  2. Serous detachment of retinal pigment epithelium of left eye  H35.722     1.  2.  3.  Ophthalmic Meds Ordered this visit:  No orders of the defined types were placed in this encounter.      Return in about 6 months (around 03/17/2020) for DILATE OU, OCT.  Patient Instructions  Patient to notify the office promptly if new onset visual acuity distortion or decline in acuity develops    Explained the diagnoses, plan, and follow up with the patient and they expressed understanding.  Patient expressed understanding of the importance of proper follow up care.   Clent Demark Jolly Carlini M.D. Diseases & Surgery of the Retina and Vitreous [facility] 09/15/19     Abbreviations: M myopia (nearsighted); A astigmatism; H  hyperopia (farsighted); P presbyopia; Mrx spectacle prescription;  CTL contact lenses; OD right eye; OS left eye; OU both eyes  XT exotropia; ET esotropia; PEK punctate epithelial keratitis; PEE punctate epithelial erosions; DES dry eye syndrome; MGD meibomian gland dysfunction; ATs artificial tears; PFAT's preservative free artificial tears; [place] nuclear sclerotic cataract; PSC posterior subcapsular cataract; ERM epi-retinal membrane; PVD posterior vitreous detachment; RD retinal detachment; DM diabetes mellitus; DR diabetic retinopathy; NPDR non-proliferative diabetic retinopathy; PDR proliferative diabetic retinopathy; CSME clinically significant macular edema; DME diabetic macular edema; dbh dot blot hemorrhages; CWS cotton wool spot; POAG primary open angle glaucoma; C/D cup-to-disc ratio; HVF humphrey visual field; GVF goldmann visual field; OCT optical coherence tomography; IOP intraocular pressure; BRVO Branch retinal vein occlusion; CRVO central retinal vein occlusion; CRAO central retinal artery occlusion; BRAO branch retinal artery occlusion; RT retinal tear; SB scleral buckle; PPV pars plana vitrectomy; VH Vitreous hemorrhage; PRP panretinal laser photocoagulation; IVK intravitreal kenalog; VMT vitreomacular traction; MH Macular hole;  NVD neovascularization of the disc; NVE neovascularization elsewhere; AREDS age related eye disease study; ARMD age related macular degeneration; POAG primary open angle glaucoma; EBMD epithelial/anterior basement membrane dystrophy; ACIOL anterior chamber intraocular lens; IOL intraocular lens; PCIOL posterior chamber intraocular lens; Phaco/IOL phacoemulsification with intraocular lens placement; [place] photorefractive keratectomy; LASIK laser assisted in situ keratomileusis; HTN hypertension; DM diabetes mellitus; COPD chronic obstructive pulmonary disease

## 2019-09-15 NOTE — Assessment & Plan Note (Signed)
This condition present last in December 2020, has resolved again on no therapy and observation alone

## 2019-09-15 NOTE — Patient Instructions (Signed)
Patient to notify the office promptly if new onset visual acuity distortion or decline in acuity develops

## 2019-09-15 NOTE — Assessment & Plan Note (Signed)

## 2019-09-19 DIAGNOSIS — G4700 Insomnia, unspecified: Secondary | ICD-10-CM | POA: Diagnosis not present

## 2019-09-19 DIAGNOSIS — Z87440 Personal history of urinary (tract) infections: Secondary | ICD-10-CM | POA: Diagnosis not present

## 2019-09-19 DIAGNOSIS — I2510 Atherosclerotic heart disease of native coronary artery without angina pectoris: Secondary | ICD-10-CM | POA: Diagnosis not present

## 2019-09-19 DIAGNOSIS — R0600 Dyspnea, unspecified: Secondary | ICD-10-CM | POA: Diagnosis not present

## 2019-09-21 DIAGNOSIS — Z7189 Other specified counseling: Secondary | ICD-10-CM | POA: Insufficient documentation

## 2019-09-21 NOTE — Progress Notes (Signed)
Cardiology Office Note   Date:  09/22/2019   ID:  Erin Roth, DOB November 12, 1933, MRN [id number]  PCP:  [name], MD  Cardiologist:   No primary care provider on file.   Chief Complaint  Patient presents with  . Shortness of Breath      History of Present Illness: Erin Roth is a 86 y.o. female who presents for evaluation of CAD.  She had PCI of an LAD ruptured plaque in 2014. At that time she presented with jaw pain.  In 2017 she was in the hospital with chest pain.   This was atypical and not felt to be anginal.  She had an echo which showed dynamic outflow obstruction with Valsalva with a peak gradient of 13 mm Hg.   I sent her for a POET (Plain Old Exercise Treadmill) which was negative.   Since I last saw her she saw her PCP and was hypotensive.  Her lisinopril was stopped.  She had her beta blocker reduced.  She was still hypotensive and had it changed to QOD.  She subsequently had burred vision and thought this was related to reducing the beta blocker.  She restarted half dose of her previous beta blocker daily.  She is still hypotensive.  She is alos SOB.  She notes this in the morning.  She is able to do some chair exercises and does not bring on the dyspnea.  She is not having PND or orthopnea.  She has no weight gain or edema.     Past Medical History:  Diagnosis Date  . Anxiety   . Bilateral bunions   . CAD in native artery    a. NSTEMI 2/14 => LHC 03/08/12: Mid LAD 95% (hazy-suggestive of a ruptured plaque), EF 40%, distal anterior, apical and distal inferior HK =>  PCI: Xience DES to the mid LAD.  [name] Cardiomyopathy ([place])    LVEF 45% on cath/echo 03/2012, suspected to be ischemic vs stress-induced  . Depression   . Diverticulosis   . Fracture 2013   Right foot fracture- did not require surgery  . Hyperlipidemia   . Hypertension   . Macular degeneration   . Macular degeneration   . Mild mitral regurgitation    a. Echo 03/09/12: Mild focal basal hypertrophy  the septum, EF 45-50%, distal anteroseptal, anterolateral, inferolateral, inferoseptal and apical HK, grade 2 diastolic dysfunction, mild MR, PASP 52  . Osteoporosis   . Right knee meniscal tear 07/2016  . Self-catheterizes urinary bladder   . UTI (lower urinary tract infection)     Past Surgical History:  Procedure Laterality Date  . ABDOMINAL HYSTERECTOMY  1980  . BACK SURGERY  1986  . CARPAL TUNNEL RELEASE Right MAy 2015  . CATARACT EXTRACTION W/PHACO Left 12/15/2014   Dr. Kathrin Penner  . CATARACT EXTRACTION W/PHACO Right 02/14/2015   Dr. Kathrin Penner  . CORONARY ANGIOPLASTY WITH STENT PLACEMENT  03/11/12   95% mid LAD stenosis s/p DES, minimal nonobs dz elsewhere; LVEF 40%, moderate hypokinesis of distal anterior, apical and distal inferior walls, mildly elevated filling pressures, mild pulmonary HTN  . LEFT HEART CATHETERIZATION WITH CORONARY ANGIOGRAM N/A 03/11/2012   Procedure: LEFT HEART CATHETERIZATION WITH CORONARY ANGIOGRAM;  Surgeon: Wellington Hampshire, MD;  Location: [facility]ATH [facility];  Service: Cardiovascular;  Laterality: N/A;  . reclast  2013     Current Outpatient Medications  Medication Sig Dispense Refill  . ALPRAZolam (XANAX) 0.5 MG tablet Take 0.5 mg by mouth daily.     [name]  Ascorbic Acid (VITAMIN C) 1000 MG tablet Take 1,000 mg by mouth daily.    [name] aspirin 81 MG tablet Take 81 mg by mouth daily.    [name] atorvastatin (LIPITOR) 80 MG tablet take 1 tablet by mouth once daily 90 tablet 2  . cholecalciferol (VITAMIN D) 1000 UNITS tablet Take 1,000 Units by mouth daily.     . Cranberry (ELLURA PO) Take 1 tablet by mouth daily.     [name] denosumab (PROLIA) 60 MG/ML SOSY injection Inject 60 mg into the skin every 6 (six) months.    . metoprolol tartrate (LOPRESSOR) 25 MG tablet Take 0.5 tablets (12.5 mg total) by mouth daily. 45 tablet 3  . Multiple Vitamins-Minerals (ICAPS) CAPS Take 1 capsule by mouth every morning.    . nitroGLYCERIN (NITROSTAT) 0.4 MG SL tablet 1 TABLET UNDER THE  TONGUE EVERY 5 MINUTES FOR UP TO 3 DOSES AS NEEDEDFOR CHEST PAIN. 25 tablet 3  . Omega-3 Fatty Acids (FISH OIL) 1000 MG CAPS Take 1 capsule by mouth daily.    [name] SHINGRIX injection      No current facility-administered medications for this visit.    Allergies:   Estrogens, Pneumococcal vaccines, Bee venom, Lexapro [escitalopram oxalate], and Sulfa antibiotics    ROS:  Please see the history of present illness.   Otherwise, review of systems are positive for none.   All other systems are reviewed and negative.    PHYSICAL EXAM: VS:  BP (!) 134/100 (BP Location: Left Arm, Patient Position: Sitting, Cuff Size: Normal)   Pulse 73   Ht 5\' 6"  (1.676 m)   Wt 147 lb (66.7 kg)   BMI 23.73 kg/m  , BMI Body mass index is 23.73 kg/m. GENERAL:  Well appearing NECK:  No jugular venous distention, waveform within normal limits, carotid upstroke brisk and symmetric, no bruits, no thyromegaly LUNGS:  Clear to auscultation bilaterally CHEST:  Unremarkable HEART:  PMI not displaced or sustained,S1 and S2 within normal limits, no S3, no S4, no clicks, no rubs, 2/6 apical systolic murmur no change with Valsalva, no diastolic  murmurs ABD:  Flat, positive bowel sounds normal in frequency in pitch, no bruits, no rebound, no guarding, no midline pulsatile mass, no hepatomegaly, no splenomegaly EXT:  2 plus pulses throughout, no edema, no cyanosis no clubbing    EKG:  EKG is ordered today. The ekg ordered demonstrates Sinus rhythm, rate 73, axis within normal limits, intervals within normal limits, no acute ST-T wave changes.   Recent Labs: 11/18/2018: ALT 20; BUN 22; Creatinine, Ser 1.03; Hemoglobin 14.2; Platelets 179; Potassium 4.5; Sodium 139    Lipid Panel    Component Value Date/Time   CHOL 123 11/18/2018 0956   TRIG 108 11/18/2018 0956   HDL 56 11/18/2018 0956   CHOLHDL 2.2 11/18/2018 0956   CHOLHDL 2.3 12/02/2013 0902   VLDL 24 12/02/2013 0902   LDLCALC 47 11/18/2018 0956      Wt  Readings from Last 3 Encounters:  09/22/19 147 lb (66.7 kg)  11/11/18 162 lb 6.4 oz (73.7 kg)  11/09/17 154 lb (69.9 kg)      Other studies Reviewed: Additional studies/ records that were reviewed today include: Primary care notes. Review of the above records demonstrates:  Please see elsewhere in the note.     ASSESSMENT AND PLAN:  CAD:   I am not suspecting that any symptoms are related to obstructive CAD.  No ischemia work up is planned at this time.  She had a negative POET (Plain Old Exercise Treadmill) in June of 2017.      HTN:  The blood pressure is low and I will reduce the IR metoprolol to 12.5 mg daily and stop the PM dose.   (Of note the BP was aberrantly elevated today.)   HYPERLIPIDEMIA:      Her LDL is 110 and HDL is 49.  She will continue on Lipitor.  She needs repeat lipids and switched to Crestor if LDL is not less than 70.  SOB:   I will check an echo.  She had a slight subvalvular gradient in the past.  COVID EDUCATION:  She has been vaccinated.      Current medicines are reviewed at length with the patient today.  The patient does not have concerns regarding medicines.  The following changes have been made:  no change  Labs/ tests ordered today include:   Orders Placed This Encounter  Procedures  . EKG 12-Lead  . ECHOCARDIOGRAM COMPLETE     Disposition:   FU with me in 3 months.      Signed, Minus Breeding, MD  09/22/2019 9:26 PM    [facility]

## 2019-09-22 ENCOUNTER — Other Ambulatory Visit: Payer: Self-pay

## 2019-09-22 ENCOUNTER — Encounter: Payer: Self-pay | Admitting: Cardiology

## 2019-09-22 ENCOUNTER — Ambulatory Visit: Payer: PPO | Admitting: Cardiology

## 2019-09-22 VITALS — BP 134/100 | HR 73 | Ht 66.0 in | Wt 147.0 lb

## 2019-09-22 DIAGNOSIS — R011 Cardiac murmur, unspecified: Secondary | ICD-10-CM | POA: Diagnosis not present

## 2019-09-22 DIAGNOSIS — E785 Hyperlipidemia, unspecified: Secondary | ICD-10-CM | POA: Diagnosis not present

## 2019-09-22 DIAGNOSIS — I429 Cardiomyopathy, unspecified: Secondary | ICD-10-CM | POA: Diagnosis not present

## 2019-09-22 DIAGNOSIS — R0602 Shortness of breath: Secondary | ICD-10-CM

## 2019-09-22 DIAGNOSIS — Z7189 Other specified counseling: Secondary | ICD-10-CM | POA: Diagnosis not present

## 2019-09-22 DIAGNOSIS — I2510 Atherosclerotic heart disease of native coronary artery without angina pectoris: Secondary | ICD-10-CM | POA: Diagnosis not present

## 2019-09-22 DIAGNOSIS — I10 Essential (primary) hypertension: Secondary | ICD-10-CM | POA: Diagnosis not present

## 2019-09-22 MED ORDER — METOPROLOL TARTRATE 25 MG PO TABS: 12.5000 mg | ORAL_TABLET | Freq: Every day | ORAL | 3 refills | Status: DC

## 2019-09-22 NOTE — Patient Instructions (Signed)
Medication Instructions:  Decrease Metoprolol 12.5 mg daily  *If you need a refill on your cardiac medications before your next appointment, please call your pharmacy*   Lab Work: None   Testing/Procedures: Your physician has requested that you have an echocardiogram. Echocardiography is a painless test that uses sound waves to create images of your heart. It provides your doctor with information about the size and shape of your heart and how well your heart's chambers and valves are working. This procedure takes approximately one hour. There are no restrictions for this procedure. [place] [address]    Follow-Up: At [facility], you and your health needs are our priority.  As part of our continuing mission to provide you with exceptional heart care, we have created designated Provider Care Teams.  These Care Teams include your primary Cardiologist (physician) and Advanced Practice Providers (APPs -  Physician Assistants and Nurse Practitioners) who all work together to provide you with the care you need, when you need it.  We recommend signing up for the patient portal called "MyChart".  Sign up information is provided on this After Visit Summary.  MyChart is used to connect with patients for Virtual Visits (Telemedicine).  Patients are able to view lab/test results, encounter notes, upcoming appointments, etc.  Non-urgent messages can be sent to your provider as well.   To learn more about what you can do with MyChart, go to [email or web address].    Your next appointment:   2 month(s)  The format for your next appointment:   In Person  Provider:   Minus Breeding, MD

## 2019-09-26 DIAGNOSIS — Z1231 Encounter for screening mammogram for malignant neoplasm of breast: Secondary | ICD-10-CM | POA: Diagnosis not present

## 2019-09-26 DIAGNOSIS — M85852 Other specified disorders of bone density and structure, left thigh: Secondary | ICD-10-CM | POA: Diagnosis not present

## 2019-09-26 DIAGNOSIS — M810 Age-related osteoporosis without current pathological fracture: Secondary | ICD-10-CM | POA: Diagnosis not present

## 2019-10-07 DIAGNOSIS — R339 Retention of urine, unspecified: Secondary | ICD-10-CM | POA: Diagnosis not present

## 2019-10-30 DIAGNOSIS — M810 Age-related osteoporosis without current pathological fracture: Secondary | ICD-10-CM | POA: Diagnosis not present

## 2019-11-04 ENCOUNTER — Ambulatory Visit (HOSPITAL_COMMUNITY): Payer: PPO | Attending: Cardiology

## 2019-11-04 ENCOUNTER — Other Ambulatory Visit: Payer: Self-pay

## 2019-11-04 DIAGNOSIS — I429 Cardiomyopathy, unspecified: Secondary | ICD-10-CM | POA: Insufficient documentation

## 2019-11-04 DIAGNOSIS — I10 Essential (primary) hypertension: Secondary | ICD-10-CM | POA: Insufficient documentation

## 2019-11-04 DIAGNOSIS — I2510 Atherosclerotic heart disease of native coronary artery without angina pectoris: Secondary | ICD-10-CM | POA: Insufficient documentation

## 2019-11-04 DIAGNOSIS — E785 Hyperlipidemia, unspecified: Secondary | ICD-10-CM | POA: Insufficient documentation

## 2019-11-04 LAB — ECHOCARDIOGRAM COMPLETE
AV Mean grad: 10.5 mmHg
AV Peak grad: 18.4 mmHg
Ao pk vel: 2.15 m/s
Area-P 1/2: 2.76 cm2
P 1/2 time: 518 msec
S' Lateral: 1.7 cm

## 2019-11-07 DIAGNOSIS — R339 Retention of urine, unspecified: Secondary | ICD-10-CM | POA: Diagnosis not present

## 2019-11-16 DIAGNOSIS — R0602 Shortness of breath: Secondary | ICD-10-CM | POA: Insufficient documentation

## 2019-11-16 NOTE — Progress Notes (Signed)
Cardiology Office Note   Date:  11/18/2019   ID:  Erin Roth, DOB Oct 28, 1933, MRN [id number]  PCP:  [name], MD  Cardiologist:   No primary care provider on file.   Chief Complaint  Patient presents with  . Shortness of Breath      History of Present Illness: Erin Roth is a 86 y.o. female who presents for evaluation of CAD.  She had PCI of an LAD ruptured plaque in 2014. At that time she presented with jaw pain.  In 2017 she was in the hospital with chest pain.   This was atypical and not felt to be anginal.  She had an echo which showed dynamic outflow obstruction with Valsalva with a peak gradient of 13 mm Hg.   I sent her for a POET (Plain Old Exercise Treadmill) which was negative. She was SOB and I sent her for an echo.  It was essentially unremarkable but there was no Valsalva to evaluate for possible dynamic outflow obstruction.  At the last visit I reduced the beta blocker because of increased fatigue and low BP.   After I saw her she had lisinopril stopped and interestingly her shortness of breath improved.  She was restarted on the lisinopril at 5 mg and she thinks she is short of breath again.  She is not having any cough fevers or chills.  She is not having any chest pressure, neck or arm discomfort.  She had no weight gain or edema.   Past Medical History:  Diagnosis Date  . Anxiety   . Bilateral bunions   . CAD in native artery    a. NSTEMI 2/14 => LHC 03/08/12: Mid LAD 95% (hazy-suggestive of a ruptured plaque), EF 40%, distal anterior, apical and distal inferior HK =>  PCI: Xience DES to the mid LAD.  [name] Cardiomyopathy ([place])    LVEF 45% on cath/echo 03/2012, suspected to be ischemic vs stress-induced  . Depression   . Diverticulosis   . Fracture 2013   Right foot fracture- did not require surgery  . Hyperlipidemia   . Hypertension   . Macular degeneration   . Macular degeneration   . Mild mitral regurgitation    a. Echo 03/09/12: Mild focal basal  hypertrophy the septum, EF 45-50%, distal anteroseptal, anterolateral, inferolateral, inferoseptal and apical HK, grade 2 diastolic dysfunction, mild MR, PASP 52  . Osteoporosis   . Right knee meniscal tear 07/2016  . Self-catheterizes urinary bladder   . UTI (lower urinary tract infection)     Past Surgical History:  Procedure Laterality Date  . ABDOMINAL HYSTERECTOMY  1980  . BACK SURGERY  1986  . CARPAL TUNNEL RELEASE Right MAy 2015  . CATARACT EXTRACTION W/PHACO Left 12/15/2014   Dr. Kathrin Penner  . CATARACT EXTRACTION W/PHACO Right 02/14/2015   Dr. Kathrin Penner  . CORONARY ANGIOPLASTY WITH STENT PLACEMENT  03/11/12   95% mid LAD stenosis s/p DES, minimal nonobs dz elsewhere; LVEF 40%, moderate hypokinesis of distal anterior, apical and distal inferior walls, mildly elevated filling pressures, mild pulmonary HTN  . LEFT HEART CATHETERIZATION WITH CORONARY ANGIOGRAM N/A 03/11/2012   Procedure: LEFT HEART CATHETERIZATION WITH CORONARY ANGIOGRAM;  Surgeon: Wellington Hampshire, MD;  Location: [facility];  Service: Cardiovascular;  Laterality: N/A;  . reclast  2013     Current Outpatient Medications  Medication Sig Dispense Refill  . Ascorbic Acid (VITAMIN C) 1000 MG tablet Take 1,000 mg by mouth daily.    [name] aspirin  81 MG tablet Take 81 mg by mouth daily.    [name] atorvastatin (LIPITOR) 80 MG tablet take 1 tablet by mouth once daily 90 tablet 2  . cholecalciferol (VITAMIN D) 1000 UNITS tablet Take 1,000 Units by mouth daily.     [name] denosumab (PROLIA) 60 MG/ML SOSY injection Inject 60 mg into the skin every 6 (six) months.    . metoprolol tartrate (LOPRESSOR) 25 MG tablet Take 0.5 tablets (12.5 mg total) by mouth daily. 45 tablet 3  . Multiple Vitamins-Minerals (ICAPS) CAPS Take 1 capsule by mouth every morning.    . nitroGLYCERIN (NITROSTAT) 0.4 MG SL tablet 1 TABLET UNDER THE TONGUE EVERY 5 MINUTES FOR UP TO 3 DOSES AS NEEDEDFOR CHEST PAIN. 25 tablet 3  . Omega-3 Fatty Acids (FISH OIL) 1000 MG  CAPS Take 1 capsule by mouth daily.    [name] SHINGRIX injection     . traZODone (DESYREL) 50 MG tablet Take 50 mg by mouth at bedtime.    [name] losartan (COZAAR) 25 MG tablet Take 1 tablet (25 mg total) by mouth daily. 30 tablet 5   No current facility-administered medications for this visit.    Allergies:   Estrogens, Pneumococcal vaccines, Bee venom, Lexapro [escitalopram oxalate], and Sulfa antibiotics    ROS:  Please see the history of present illness.   Otherwise, review of systems are positive for none.   All other systems are reviewed and negative.    PHYSICAL EXAM: VS:  BP 140/80 (BP Location: Left Arm, Patient Position: Sitting, Cuff Size: Normal)   Pulse 74   Ht 5\' 6"  (1.676 m)   Wt 143 lb (64.9 kg)   BMI 23.08 kg/m  , BMI Body mass index is 23.08 kg/m. GENERAL:  Well appearing NECK:  No jugular venous distention, waveform within normal limits, carotid upstroke brisk and symmetric, no bruits, no thyromegaly LUNGS:  Clear to auscultation bilaterally CHEST:  Unremarkable HEART:  PMI not displaced or sustained,S1 and S2 within normal limits, no S3, no S4, no clicks, no rubs, soft apical systolic murmur heard also slightly at the right upper sternal border and not increasing with the strain phase of Valsalva, no diastolic murmurs ABD:  Flat, positive bowel sounds normal in frequency in pitch, no bruits, no rebound, no guarding, no midline pulsatile mass, no hepatomegaly, no splenomegaly EXT:  2 plus pulses throughout, no edema, no cyanosis no clubbing    EKG:  EKG is not ordered today.   Recent Labs: No results found for requested labs within last 8760 hours.    Lipid Panel    Component Value Date/Time   CHOL 123 11/18/2018 0956   TRIG 108 11/18/2018 0956   HDL 56 11/18/2018 0956   CHOLHDL 2.2 11/18/2018 0956   CHOLHDL 2.3 12/02/2013 0902   VLDL 24 12/02/2013 0902   LDLCALC 47 11/18/2018 0956      Wt Readings from Last 3 Encounters:  11/18/19 143 lb (64.9 kg)   09/22/19 147 lb (66.7 kg)  11/11/18 162 lb 6.4 oz (73.7 kg)      Other studies Reviewed: Additional studies/ records that were reviewed today include: None Review of the above records demonstrates:  NA   ASSESSMENT AND PLAN:  CAD:   The patient has no new sypmtoms.  No further cardiovascular testing is indicated.  We will continue with aggressive risk reduction and meds as listed.  HTN:  The blood pressure is upper limits of normal.  Because of her shortness of breath I am going  to get rid of the lisinopril and switch this to Cozaar 25 mg daily.  She will keep an eye on her blood pressure.   HYPERLIPIDEMIA:      Her LDL is 47 with an HDL of 56.  No change in therapy.   SOB:   This will be handled as above.  If she has persistent shortness of breath I will have to get Valsalva with an echo.  I am not strongly however suspecting that her septal hypertrophy is the etiology of her dyspnea.    Current medicines are reviewed at length with the patient today.  The patient does not have concerns regarding medicines.  The following changes have been made:  no change  Labs/ tests ordered today include:   No orders of the defined types were placed in this encounter.    Disposition:   FU with me in 3 months.      Signed, Minus Breeding, MD  11/18/2019 3:14 PM    [facility]

## 2019-11-17 DIAGNOSIS — N8111 Cystocele, midline: Secondary | ICD-10-CM | POA: Diagnosis not present

## 2019-11-17 DIAGNOSIS — N3020 Other chronic cystitis without hematuria: Secondary | ICD-10-CM | POA: Diagnosis not present

## 2019-11-17 DIAGNOSIS — N312 Flaccid neuropathic bladder, not elsewhere classified: Secondary | ICD-10-CM | POA: Diagnosis not present

## 2019-11-18 ENCOUNTER — Other Ambulatory Visit: Payer: Self-pay

## 2019-11-18 ENCOUNTER — Encounter: Payer: Self-pay | Admitting: Cardiology

## 2019-11-18 ENCOUNTER — Ambulatory Visit: Payer: PPO | Admitting: Cardiology

## 2019-11-18 ENCOUNTER — Other Ambulatory Visit: Payer: Self-pay | Admitting: Cardiology

## 2019-11-18 VITALS — BP 140/80 | HR 74 | Ht 66.0 in | Wt 143.0 lb

## 2019-11-18 DIAGNOSIS — I2510 Atherosclerotic heart disease of native coronary artery without angina pectoris: Secondary | ICD-10-CM

## 2019-11-18 DIAGNOSIS — I10 Essential (primary) hypertension: Secondary | ICD-10-CM

## 2019-11-18 DIAGNOSIS — R0602 Shortness of breath: Secondary | ICD-10-CM | POA: Diagnosis not present

## 2019-11-18 DIAGNOSIS — E785 Hyperlipidemia, unspecified: Secondary | ICD-10-CM | POA: Diagnosis not present

## 2019-11-18 MED ORDER — LOSARTAN POTASSIUM 25 MG PO TABS: 25.0000 mg | ORAL_TABLET | Freq: Every day | ORAL | 5 refills | Status: DC

## 2019-11-18 NOTE — Patient Instructions (Signed)
Medication Instructions:  STOP the Lisinopril START Losartan (Cozaar) 25 mg once daily  *If you need a refill on your cardiac medications before your next appointment, please call your pharmacy*   Lab Work: None ordered If you have labs (blood work) drawn today and your tests are completely normal, you will receive your results only by: [name] MyChart Message (if you have MyChart) OR . A paper copy in the mail If you have any lab test that is abnormal or we need to change your treatment, we will call you to review the results.   Testing/Procedures: None ordered   Follow-Up: At [facility], you and your health needs are our priority.  As part of our continuing mission to provide you with exceptional heart care, we have created designated Provider Care Teams.  These Care Teams include your primary Cardiologist (physician) and Advanced Practice Providers (APPs -  Physician Assistants and Nurse Practitioners) who all work together to provide you with the care you need, when you need it.  We recommend signing up for the patient portal called "MyChart".  Sign up information is provided on this After Visit Summary.  MyChart is used to connect with patients for Virtual Visits (Telemedicine).  Patients are able to view lab/test results, encounter notes, upcoming appointments, etc.  Non-urgent messages can be sent to your provider as well.   To learn more about what you can do with MyChart, go to [email or web address].    Your next appointment:   6 month(s)  The format for your next appointment:   In Person  Provider:   You may see Dr. Percival Spanish or one of the following Advanced Practice Providers on your designated Care Team:    Rosaria Ferries, PA-C  Jory Sims, DNP, ANP

## 2019-11-18 NOTE — Telephone Encounter (Signed)
Pt called and stated she no longer uses [facility]. She uses [facility].  I have update this in her chart.  She would like to know if the recent refill that was sent to [facility] from Dr Percival Spanish can be resent to [place]?    Best number [phone]

## 2019-11-19 MED ORDER — LOSARTAN POTASSIUM 25 MG PO TABS
25.0000 mg | ORAL_TABLET | Freq: Every day | ORAL | 3 refills | Status: DC
Start: 2019-11-19 — End: 2019-12-15

## 2019-11-28 DIAGNOSIS — Z0000 Encounter for general adult medical examination without abnormal findings: Secondary | ICD-10-CM | POA: Diagnosis not present

## 2019-11-28 DIAGNOSIS — H9193 Unspecified hearing loss, bilateral: Secondary | ICD-10-CM | POA: Diagnosis not present

## 2019-11-28 DIAGNOSIS — E785 Hyperlipidemia, unspecified: Secondary | ICD-10-CM | POA: Diagnosis not present

## 2019-11-28 DIAGNOSIS — Z23 Encounter for immunization: Secondary | ICD-10-CM | POA: Diagnosis not present

## 2019-11-28 DIAGNOSIS — I10 Essential (primary) hypertension: Secondary | ICD-10-CM | POA: Diagnosis not present

## 2019-11-28 DIAGNOSIS — M810 Age-related osteoporosis without current pathological fracture: Secondary | ICD-10-CM | POA: Diagnosis not present

## 2019-11-28 DIAGNOSIS — I2510 Atherosclerotic heart disease of native coronary artery without angina pectoris: Secondary | ICD-10-CM | POA: Diagnosis not present

## 2019-11-28 DIAGNOSIS — F5101 Primary insomnia: Secondary | ICD-10-CM | POA: Diagnosis not present

## 2019-12-08 DIAGNOSIS — R339 Retention of urine, unspecified: Secondary | ICD-10-CM | POA: Diagnosis not present

## 2019-12-13 ENCOUNTER — Ambulatory Visit: Payer: PPO | Attending: Internal Medicine

## 2019-12-13 ENCOUNTER — Other Ambulatory Visit: Payer: Self-pay

## 2019-12-13 DIAGNOSIS — Z23 Encounter for immunization: Secondary | ICD-10-CM

## 2019-12-13 NOTE — Progress Notes (Signed)
   Covid-19 Vaccination Clinic  Name:  Erin Roth    MRN: [id number] DOB: 04-09-1933  12/13/2019  Erin Roth was observed post Covid-19 immunization for 15 minutes without incident. She was provided with Vaccine Information Sheet and instruction to access the V-Safe system.   Erin Roth was instructed to call 911 with any severe reactions post vaccine: [name] Difficulty breathing  . Swelling of face and throat  . A fast heartbeat  . A bad rash all over body  . Dizziness and weakness   Immunizations Administered    Name Date Dose VIS Date Route   Pfizer COVID-19 Vaccine 12/13/2019 11:43 AM 0.3 mL 11/19/2019 Intramuscular   Manufacturer: [place]   Lot: [id number]   [place]: [id number]

## 2019-12-15 ENCOUNTER — Telehealth: Payer: Self-pay | Admitting: Cardiology

## 2019-12-15 ENCOUNTER — Other Ambulatory Visit: Payer: Self-pay | Admitting: Cardiology

## 2019-12-15 MED ORDER — LISINOPRIL 5 MG PO TABS: 5.0000 mg | ORAL_TABLET | Freq: Every day | ORAL | 3 refills | Status: DC

## 2019-12-15 NOTE — Telephone Encounter (Signed)
Spoke with patient. Per Dr. Percival Spanish okay to continue Lisinopril. Losartan discontinued and Lisinopril 5mg  resumed.

## 2019-12-15 NOTE — Telephone Encounter (Signed)
Continue the lisinopril.

## 2019-12-15 NOTE — Telephone Encounter (Signed)
Pt c/o medication issue:  1. Name of Medication: losartan (COZAAR) 25 MG tablet  2. How are you currently taking this medication (dosage and times per day)? 1 tablet (25 mg total) by mouth daily  3. Are you having a reaction (difficulty breathing--STAT)? No  4. What is your medication issue? Patient states this medication was causing lightheadedness, so she stopped taking it and began taking Lisinopril again. However, she will be out of Lisinopril soon. She would like to know if she needs to begin taking Losartan again or if she will need a refill for Lisinopril. Please advise.

## 2019-12-15 NOTE — Telephone Encounter (Signed)
Spoke with patient. Patient reports she filled her Losartan on 11/19/19. She took the medication for 2-3 days and the dizziness was so bad that she stopped the Losartan and resumed her Lisinopril. Patient took the Losartan at breakfast, she did not try the medication at night. Patient reports her shortness of breath is the same on the lisinopril as it was when she was seen on 10/19. It has not worsened. Patient would like to know if she should continue the Lisinopril or if Dr. Percival Spanish would like to try another medication. She does not want to try the Losartan anymore. Will route to MD for review.

## 2019-12-22 DIAGNOSIS — K219 Gastro-esophageal reflux disease without esophagitis: Secondary | ICD-10-CM | POA: Diagnosis not present

## 2019-12-22 DIAGNOSIS — M810 Age-related osteoporosis without current pathological fracture: Secondary | ICD-10-CM | POA: Diagnosis not present

## 2019-12-22 DIAGNOSIS — G4700 Insomnia, unspecified: Secondary | ICD-10-CM | POA: Diagnosis not present

## 2019-12-22 DIAGNOSIS — E785 Hyperlipidemia, unspecified: Secondary | ICD-10-CM | POA: Diagnosis not present

## 2019-12-22 DIAGNOSIS — I10 Essential (primary) hypertension: Secondary | ICD-10-CM | POA: Diagnosis not present

## 2019-12-22 DIAGNOSIS — I2510 Atherosclerotic heart disease of native coronary artery without angina pectoris: Secondary | ICD-10-CM | POA: Diagnosis not present

## 2020-01-13 DIAGNOSIS — R339 Retention of urine, unspecified: Secondary | ICD-10-CM | POA: Diagnosis not present

## 2020-02-06 DIAGNOSIS — Z4689 Encounter for fitting and adjustment of other specified devices: Secondary | ICD-10-CM | POA: Diagnosis not present

## 2020-02-07 DIAGNOSIS — N813 Complete uterovaginal prolapse: Secondary | ICD-10-CM | POA: Diagnosis not present

## 2020-02-13 DIAGNOSIS — D225 Melanocytic nevi of trunk: Secondary | ICD-10-CM | POA: Diagnosis not present

## 2020-02-13 DIAGNOSIS — Z85828 Personal history of other malignant neoplasm of skin: Secondary | ICD-10-CM | POA: Diagnosis not present

## 2020-02-13 DIAGNOSIS — L820 Inflamed seborrheic keratosis: Secondary | ICD-10-CM | POA: Diagnosis not present

## 2020-02-13 DIAGNOSIS — L821 Other seborrheic keratosis: Secondary | ICD-10-CM | POA: Diagnosis not present

## 2020-02-13 DIAGNOSIS — D1801 Hemangioma of skin and subcutaneous tissue: Secondary | ICD-10-CM | POA: Diagnosis not present

## 2020-02-13 DIAGNOSIS — R339 Retention of urine, unspecified: Secondary | ICD-10-CM | POA: Diagnosis not present

## 2020-02-13 DIAGNOSIS — L570 Actinic keratosis: Secondary | ICD-10-CM | POA: Diagnosis not present

## 2020-02-24 ENCOUNTER — Other Ambulatory Visit: Payer: Self-pay | Admitting: Cardiology

## 2020-02-26 ENCOUNTER — Ambulatory Visit (INDEPENDENT_AMBULATORY_CARE_PROVIDER_SITE_OTHER): Payer: PPO | Admitting: Ophthalmology

## 2020-02-26 ENCOUNTER — Encounter (INDEPENDENT_AMBULATORY_CARE_PROVIDER_SITE_OTHER): Payer: PPO | Admitting: Ophthalmology

## 2020-02-26 ENCOUNTER — Encounter (INDEPENDENT_AMBULATORY_CARE_PROVIDER_SITE_OTHER): Payer: Self-pay | Admitting: Ophthalmology

## 2020-02-26 ENCOUNTER — Other Ambulatory Visit: Payer: Self-pay

## 2020-02-26 DIAGNOSIS — H35722 Serous detachment of retinal pigment epithelium, left eye: Secondary | ICD-10-CM | POA: Diagnosis not present

## 2020-02-26 DIAGNOSIS — H26491 Other secondary cataract, right eye: Secondary | ICD-10-CM | POA: Diagnosis not present

## 2020-02-26 DIAGNOSIS — H353132 Nonexudative age-related macular degeneration, bilateral, intermediate dry stage: Secondary | ICD-10-CM

## 2020-02-26 DIAGNOSIS — H353222 Exudative age-related macular degeneration, left eye, with inactive choroidal neovascularization: Secondary | ICD-10-CM

## 2020-02-26 NOTE — Assessment & Plan Note (Signed)
OS, no active disease.  No signs of recurrences.

## 2020-02-26 NOTE — Progress Notes (Signed)
02/26/2020     CHIEF COMPLAINT Patient presents for Blurred Vision (WIP hazy VA and intermittent scratchiness OD //Pt c/o hazy VA intermittently OD x 2 days. Pt c/o scratchy pain around eye OD that comes and goes x 2 days. VA stable OS.)   HISTORY OF PRESENT ILLNESS: Erin Roth is a 87 y.o. female who presents to the clinic today for:   HPI    Blurred Vision    In right eye.  Onset was sudden.  Vision is blurred, hazy and fluctuating.  Severity is mild.  This started 2 days ago.  Occurring intermittently.  It is worse throughout the day and at random times.  Associated symptoms include eye pain.  Treatments tried include no treatments. Additional comments: WIP hazy VA and intermittent scratchiness OD   Pt c/o hazy VA intermittently OD x 2 days. Pt c/o scratchy pain around eye OD that comes and goes x 2 days. VA stable OS.       Last edited by Rockie Neighbours, Whitney on 02/26/2020  3:14 PM. (History)      Referring physician: [name], MD [address],  [place] [address]  HISTORICAL INFORMATION:   Selected notes from the [facility]: No current outpatient medications on file. (Ophthalmic Drugs)   No current facility-administered medications for this visit. (Ophthalmic Drugs)   Current Outpatient Medications (Other)  Medication Sig  . Ascorbic Acid (VITAMIN C) 1000 MG tablet Take 1,000 mg by mouth daily.  [name] aspirin 81 MG tablet Take 81 mg by mouth daily.  [name] atorvastatin (LIPITOR) 80 MG tablet TAKE 1 TABLET BY MOUTH DAILY.  . cholecalciferol (VITAMIN D) 1000 UNITS tablet Take 1,000 Units by mouth daily.   [name] denosumab (PROLIA) 60 MG/ML SOSY injection Inject 60 mg into the skin every 6 (six) months.  [name] lisinopril (ZESTRIL) 5 MG tablet Take 1 tablet (5 mg total) by mouth daily.  . metoprolol tartrate (LOPRESSOR) 25 MG tablet Take 0.5 tablets (12.5 mg total) by mouth daily.  . Multiple Vitamins-Minerals (ICAPS) CAPS  Take 1 capsule by mouth every morning.  . nitroGLYCERIN (NITROSTAT) 0.4 MG SL tablet 1 TABLET UNDER THE TONGUE EVERY 5 MINUTES FOR UP TO 3 DOSES AS NEEDEDFOR CHEST PAIN.  [name] Omega-3 Fatty Acids (FISH OIL) 1000 MG CAPS Take 1 capsule by mouth daily.  [name] SHINGRIX injection   . traZODone (DESYREL) 50 MG tablet Take 50 mg by mouth at bedtime.   No current facility-administered medications for this visit. (Other)      REVIEW OF SYSTEMS:    ALLERGIES Allergies  Allergen Reactions  . Estrogens     Breast pain  . Pneumococcal Vaccines     rash  . Bee Venom Rash  . Lexapro [Escitalopram Oxalate] Rash    edema  . Sulfa Antibiotics Rash    PAST MEDICAL HISTORY Past Medical History:  Diagnosis Date  . Anxiety   . Bilateral bunions   . CAD in native artery    a. NSTEMI 2/14 => LHC 03/08/12: Mid LAD 95% (hazy-suggestive of a ruptured plaque), EF 40%, distal anterior, apical and distal inferior HK =>  PCI: Xience DES to the mid LAD.  [name] Cardiomyopathy ([place])    LVEF 45% on cath/echo 03/2012, suspected to be ischemic vs stress-induced  . Depression   . Diverticulosis   . Fracture 2013   Right foot fracture- did not require surgery  . Hyperlipidemia   .  Hypertension   . Macular degeneration   . Macular degeneration   . Mild mitral regurgitation    a. Echo 03/09/12: Mild focal basal hypertrophy the septum, EF 45-50%, distal anteroseptal, anterolateral, inferolateral, inferoseptal and apical HK, grade 2 diastolic dysfunction, mild MR, PASP 52  . Osteoporosis   . Right knee meniscal tear 07/2016  . Self-catheterizes urinary bladder   . UTI (lower urinary tract infection)    Past Surgical History:  Procedure Laterality Date  . ABDOMINAL HYSTERECTOMY  1980  . BACK SURGERY  1986  . CARPAL TUNNEL RELEASE Right MAy 2015  . CATARACT EXTRACTION W/PHACO Left 12/15/2014   Dr. Kathrin Penner  . CATARACT EXTRACTION W/PHACO Right 02/14/2015   Dr. Kathrin Penner  . CORONARY ANGIOPLASTY WITH STENT  PLACEMENT  03/11/12   95% mid LAD stenosis s/p DES, minimal nonobs dz elsewhere; LVEF 40%, moderate hypokinesis of distal anterior, apical and distal inferior walls, mildly elevated filling pressures, mild pulmonary HTN  . LEFT HEART CATHETERIZATION WITH CORONARY ANGIOGRAM N/A 03/11/2012   Procedure: LEFT HEART CATHETERIZATION WITH CORONARY ANGIOGRAM;  Surgeon: Wellington Hampshire, MD;  Location: [facility];  Service: Cardiovascular;  Laterality: N/A;  . reclast  2013    FAMILY HISTORY Family History  Problem Relation Age of Onset  . Colon cancer Father   . Hypertension Mother   . CAD Mother   . Heart attack Mother   . CAD Brother 41       CABG  . Cancer - Lung Brother   . Cancer - Lung Sister   . Hypertension Paternal Grandmother   . Breast cancer Paternal Grandfather     SOCIAL HISTORY Social History   Tobacco Use  . Smoking status: Never Smoker  . Smokeless tobacco: Never Used  Vaping Use  . Vaping Use: Never used  Substance Use Topics  . Alcohol use: Yes    Comment: 4 glasses of wine per week  . Drug use: No         OPHTHALMIC EXAM: Base Eye Exam    Visual Acuity (ETDRS)      Right Left   Dist cc 20/20 20/25 +2   Correction: Glasses       Tonometry (Tonopen, 3:10 PM)      Right Left   Pressure 10 10       Pupils      Dark Light Shape React APD   Right 3.5 2.5 Round Brisk None   Left 3 2 Round Brisk None       Visual Fields (Counting fingers)      Left Right    Full Full       Extraocular Movement      Right Left    Full Full       Neuro/Psych    Oriented x3: Yes   Mood/Affect: Normal       Dilation    Right eye: 1.0% Mydriacyl, 2.5% Phenylephrine @ 3:18 PM        Slit Lamp and Fundus Exam    External Exam      Right Left   External Normal Normal       Slit Lamp Exam      Right Left   Lids/Lashes Normal Normal   Conjunctiva/Sclera White and quiet White and quiet   Cornea Clear Clear   Anterior Chamber Deep and quiet Deep and  quiet   Iris Round and reactive Round and reactive   Lens Posterior chamber intraocular lens, 2+ Posterior capsular opacification  Posterior chamber intraocular lens   Anterior Vitreous Normal Normal       Fundus Exam      Right Left   Posterior Vitreous Central vitreous floaters, Posterior vitreous detachment    Disc Normal    C/D Ratio 0.45    Macula Soft drusen, no macular thickening, no exudates    Vessels Normal    Periphery Normal           IMAGING AND PROCEDURES  Imaging and Procedures for 02/26/20  OCT, Retina - OU - Both Eyes       Right Eye Quality was good. Scan locations included subfoveal. Central Foveal Thickness: 284. Progression has been stable. Findings include normal foveal contour, retinal drusen .   Left Eye Quality was good. Scan locations included subfoveal. Central Foveal Thickness: 255. Progression has been stable. Findings include abnormal foveal contour, retinal drusen .   Notes OD, no active maculopathy.  There is no active macular disease.  Subtle retinal drusen of early ARMD present.  OS chronic outer retinal/sub-RPE Fluid process which has been present for out now for some years.  Not active not enlarged as has occurred in the past                ASSESSMENT/PLAN:  Exudative age-related macular degeneration of left eye with inactive choroidal neovascularization (HCC) OS, no active disease.  No signs of recurrences.  Posterior capsular opacification, right New capsule opacification none now migrating into the visual axis.    Her cataract surgeon in the past with Dr. Kathrin Penner.  I am sure she knows that Dr. Kathrin Penner has retired.  She should return to [facility] whereupon YAG capsulotomy should be considered for the right eye to maximize her visual potential.  Serous detachment of retinal pigment epithelium of left eye Minor outer retinal RPE detachment stable over years, no active disease OS observe       ICD-10-CM   1. Serous detachment of retinal pigment epithelium of left eye  H35.722 OCT, Retina - OU - Both Eyes  2. Exudative age-related macular degeneration of left eye with inactive choroidal neovascularization ([place])  H35.3222   3. Intermediate stage nonexudative age-related macular degeneration of both eyes  H35.3132 OCT, Retina - OU - Both Eyes  4. Posterior capsular opacification, right  H26.491     1.  Schedule follow-up with [facility] and consideration of YAG capsulotomy right eye  2.  No active ARMD today 3.  Ophthalmic Meds Ordered this visit:  No orders of the defined types were placed in this encounter.      Return in about 6 months (around 08/25/2020).  There are no Patient Instructions on file for this visit.   Explained the diagnoses, plan, and follow up with the patient and they expressed understanding.  Patient expressed understanding of the importance of proper follow up care.   Clent Demark Lizet Kelso M.D. Diseases & Surgery of the Retina and Vitreous [facility] 02/26/20     Abbreviations: M myopia (nearsighted); A astigmatism; H hyperopia (farsighted); P presbyopia; Mrx spectacle prescription;  CTL contact lenses; OD right eye; OS left eye; OU both eyes  XT exotropia; ET esotropia; PEK punctate epithelial keratitis; PEE punctate epithelial erosions; DES dry eye syndrome; MGD meibomian gland dysfunction; ATs artificial tears; PFAT's preservative free artificial tears; [place] nuclear sclerotic cataract; PSC posterior subcapsular cataract; ERM epi-retinal membrane; PVD posterior vitreous detachment; RD retinal detachment; DM diabetes mellitus; DR diabetic retinopathy; NPDR non-proliferative diabetic  retinopathy; PDR proliferative diabetic retinopathy; CSME clinically significant macular edema; DME diabetic macular edema; dbh dot blot hemorrhages; CWS cotton wool spot; POAG primary open angle glaucoma; C/D cup-to-disc ratio; HVF  humphrey visual field; GVF goldmann visual field; OCT optical coherence tomography; IOP intraocular pressure; BRVO Branch retinal vein occlusion; CRVO central retinal vein occlusion; CRAO central retinal artery occlusion; BRAO branch retinal artery occlusion; RT retinal tear; SB scleral buckle; PPV pars plana vitrectomy; VH Vitreous hemorrhage; PRP panretinal laser photocoagulation; IVK intravitreal kenalog; VMT vitreomacular traction; MH Macular hole;  NVD neovascularization of the disc; NVE neovascularization elsewhere; AREDS age related eye disease study; ARMD age related macular degeneration; POAG primary open angle glaucoma; EBMD epithelial/anterior basement membrane dystrophy; ACIOL anterior chamber intraocular lens; IOL intraocular lens; PCIOL posterior chamber intraocular lens; Phaco/IOL phacoemulsification with intraocular lens placement; [place] photorefractive keratectomy; LASIK laser assisted in situ keratomileusis; HTN hypertension; DM diabetes mellitus; COPD chronic obstructive pulmonary disease

## 2020-02-26 NOTE — Assessment & Plan Note (Signed)
Minor outer retinal RPE detachment stable over years, no active disease OS observe

## 2020-02-26 NOTE — Assessment & Plan Note (Signed)
New capsule opacification none now migrating into the visual axis.    Her cataract surgeon in the past with Dr. Kathrin Penner.  I am sure she knows that Dr. Kathrin Penner has retired.  She should return to [facility] whereupon YAG capsulotomy should be considered for the right eye to maximize her visual potential.

## 2020-03-15 DIAGNOSIS — R339 Retention of urine, unspecified: Secondary | ICD-10-CM | POA: Diagnosis not present

## 2020-03-18 ENCOUNTER — Encounter (INDEPENDENT_AMBULATORY_CARE_PROVIDER_SITE_OTHER): Payer: PPO | Admitting: Ophthalmology

## 2020-04-05 ENCOUNTER — Telehealth: Payer: Self-pay | Admitting: Cardiology

## 2020-04-05 NOTE — Telephone Encounter (Signed)
Advised patient, verbalized understanding  

## 2020-04-05 NOTE — Telephone Encounter (Signed)
I would not suggest other changes to her meds until we see her and can do things like orthostatics

## 2020-04-05 NOTE — Telephone Encounter (Signed)
Pt c/o BP issue: STAT if pt c/o blurred vision, one-sided weakness or slurred speech  1. What are your last 5 BP readings?   97/70 104/63, 136/85, 142/87,  146/88, 142/87 138/92/  2. Are you having any other symptoms (ex. Dizziness, headache, blurred vision, passed out)?  lightheaded  3. What is your BP issue? Blood pressure going up and down- pt wanted to be seen- I made an appt for  Friday(04-09-20- please call to evaluate

## 2020-04-05 NOTE — Telephone Encounter (Addendum)
Spoke with patient and she has been experiencing lightheadedness off and on for while. Not worse upon standing and blood pressure tend to be in the 130's when she takes while lightheaded  Blood pressures up and down This am blood pressure 115/77 prior to medications so she did not take Lisinopril At 11:00 am blood pressure 143/91  She did stop her Metoprolol about 4 days ago   Has appointment scheduled 3/11  Will forward to Dr Percival Spanish for review

## 2020-04-07 NOTE — Progress Notes (Signed)
Cardiology Office Note   Date:  04/09/2020   ID:  ZEN FELLING, DOB April 05, 1933, MRN [id number]  PCP:  [name], MD  Cardiologist:   No primary care provider on file.   Chief Complaint  Patient presents with  . Dizziness      History of Present Illness: Erin Roth is a 87 y.o. female who presents for evaluation of CAD.  She had PCI of an LAD ruptured plaque in 2014. At that time she presented with jaw pain.  In 2017 she was in the hospital with chest pain.   This was atypical and not felt to be anginal.  She had an echo which showed dynamic outflow obstruction with Valsalva with a peak gradient of 13 mm Hg.   I sent her for a POET (Plain Old Exercise Treadmill) which was negative. She was SOB and I sent her for an echo.  It was essentially unremarkable but there was no Valsalva to evaluate for possible dynamic outflow obstruction.  She had SOB associated with ACE inhibitor.    She called to be added to my schedule this week.  She was having some lightheadedness off and on for a while.  Her blood pressure has been somewhat labile.  She brings me demonstrates that at times her systolic is in the 57Q other times it is in the 140s she does have a little lightheadedness she reports but today she is minimizing this.  She stopped taking her lisinopril.  She is not taking her beta-blocker.  Overall her blood pressure is well controlled.  She has not had any chest pressure, neck or arm discomfort.  She has not had any new shortness of breath, PND or orthopnea.  She was not orthostatic in the office today.  She has been to [place] and [place] and is planning a trip to [place].    Past Medical History:  Diagnosis Date  . Anxiety   . Bilateral bunions   . CAD in native artery    a. NSTEMI 2/14 => LHC 03/08/12: Mid LAD 95% (hazy-suggestive of a ruptured plaque), EF 40%, distal anterior, apical and distal inferior HK =>  PCI: Xience DES to the mid LAD.  [name] Cardiomyopathy ([place])    LVEF 45%  on cath/echo 03/2012, suspected to be ischemic vs stress-induced  . Depression   . Diverticulosis   . Fracture 2013   Right foot fracture- did not require surgery  . Hyperlipidemia   . Hypertension   . Macular degeneration   . Mild mitral regurgitation    a. Echo 03/09/12: Mild focal basal hypertrophy the septum, EF 45-50%, distal anteroseptal, anterolateral, inferolateral, inferoseptal and apical HK, grade 2 diastolic dysfunction, mild MR, PASP 52  . Osteoporosis   . Right knee meniscal tear 07/2016  . Self-catheterizes urinary bladder   . UTI (lower urinary tract infection)     Past Surgical History:  Procedure Laterality Date  . ABDOMINAL HYSTERECTOMY  1980  . BACK SURGERY  1986  . CARPAL TUNNEL RELEASE Right MAy 2015  . CATARACT EXTRACTION W/PHACO Left 12/15/2014   Dr. Kathrin Penner  . CATARACT EXTRACTION W/PHACO Right 02/14/2015   Dr. Kathrin Penner  . CORONARY ANGIOPLASTY WITH STENT PLACEMENT  03/11/12   95% mid LAD stenosis s/p DES, minimal nonobs dz elsewhere; LVEF 40%, moderate hypokinesis of distal anterior, apical and distal inferior walls, mildly elevated filling pressures, mild pulmonary HTN  . LEFT HEART CATHETERIZATION WITH CORONARY ANGIOGRAM N/A 03/11/2012   Procedure: LEFT HEART CATHETERIZATION  WITH CORONARY ANGIOGRAM;  Surgeon: Wellington Hampshire, MD;  Location: [facility] CATH LAB;  Service: Cardiovascular;  Laterality: N/A;  . reclast  2013     Current Outpatient Medications  Medication Sig Dispense Refill  . Ascorbic Acid (VITAMIN C) 1000 MG tablet Take 1,000 mg by mouth daily.    [name] aspirin 81 MG tablet Take 81 mg by mouth daily.    [name] atorvastatin (LIPITOR) 80 MG tablet TAKE 1 TABLET BY MOUTH DAILY. 90 tablet 2  . cholecalciferol (VITAMIN D) 1000 UNITS tablet Take 1,000 Units by mouth daily.     [name] denosumab (PROLIA) 60 MG/ML SOSY injection Inject 60 mg into the skin every 6 (six) months.    . metoprolol tartrate (LOPRESSOR) 25 MG tablet 1/2 tablet    . Multiple  Vitamins-Minerals (ICAPS) CAPS Take 1 capsule by mouth every morning.    . nitroGLYCERIN (NITROSTAT) 0.4 MG SL tablet 1 TABLET UNDER THE TONGUE EVERY 5 MINUTES FOR UP TO 3 DOSES AS NEEDEDFOR CHEST PAIN. 25 tablet 3  . Omega-3 Fatty Acids (FISH OIL) 1000 MG CAPS Take 1 capsule by mouth daily.    [name] SHINGRIX injection     . traZODone (DESYREL) 50 MG tablet Take 50 mg by mouth at bedtime.     No current facility-administered medications for this visit.    Allergies:   Estrogens, Pneumococcal vaccines, Bee venom, Lexapro [escitalopram oxalate], and Sulfa antibiotics    ROS:  Please see the history of present illness.   Otherwise, review of systems are positive for none.   All other systems are reviewed and negative.    PHYSICAL EXAM: VS:  BP 138/90   Pulse 73   Ht 5\' 6"  (1.676 m)   Wt 140 lb 9.6 oz (63.8 kg)   SpO2 97%   BMI 22.69 kg/m  , BMI Body mass index is 22.69 kg/m. GENERAL:  Well appearing NECK:  No jugular venous distention, waveform within normal limits, carotid upstroke brisk and symmetric, no bruits, no thyromegaly LUNGS:  Clear to auscultation bilaterally CHEST:  Unremarkable HEART:  PMI not displaced or sustained,S1 and S2 within normal limits, no S3, no S4, no clicks, no rubs, soft apical systolic murmur nonradiating, no diastolic murmurs ABD:  Flat, positive bowel sounds normal in frequency in pitch, no bruits, no rebound, no guarding, no midline pulsatile mass, no hepatomegaly, no splenomegaly EXT:  2 plus pulses throughout, no edema, no cyanosis no clubbing  EKG:  EKG is 73 ordered today.   Recent Labs: No results found for requested labs within last 8760 hours.    Lipid Panel    Component Value Date/Time   CHOL 123 11/18/2018 0956   TRIG 108 11/18/2018 0956   HDL 56 11/18/2018 0956   CHOLHDL 2.2 11/18/2018 0956   CHOLHDL 2.3 12/02/2013 0902   VLDL 24 12/02/2013 0902   LDLCALC 47 11/18/2018 0956      Wt Readings from Last 3 Encounters:  04/09/20 140  lb 9.6 oz (63.8 kg)  11/18/19 143 lb (64.9 kg)  09/22/19 147 lb (66.7 kg)      Other studies Reviewed: Additional studies/ records that were reviewed today include: None Review of the above records demonstrates:  NA   ASSESSMENT AND PLAN:  CAD:   The patient has no new sypmtoms.  No further cardiovascular testing is indicated.  We will continue with aggressive risk reduction and meds as listed.  HTN:  The blood pressure is a little bit labile but overall pretty well  controlled without any medications.  She has been very sensitive to medications so I will does not have her take the beta-blocker or the ACE inhibitor or Cozaar which I have considered.  She can just monitor her blood pressures.  No change in therapy.   HYPERLIPIDEMIA:      Her LDL is 60 with HDL of 67.  She actually tolerates Lipitor and so she will continue this.   SOB:    She is not complaining of this.  No change in therapy.     Current medicines are reviewed at length with the patient today.  The patient does not have concerns regarding medicines.  The following changes have been made:  As above.   Labs/ tests ordered today include: NA  Orders Placed This Encounter  Procedures  . EKG 12-Lead     Disposition:   FU with me in 12 months.      Signed, Minus Breeding, MD  04/09/2020 11:52 AM    [place]

## 2020-04-09 ENCOUNTER — Ambulatory Visit: Payer: PPO | Admitting: Cardiology

## 2020-04-09 ENCOUNTER — Encounter: Payer: Self-pay | Admitting: Cardiology

## 2020-04-09 ENCOUNTER — Other Ambulatory Visit: Payer: Self-pay

## 2020-04-09 VITALS — BP 138/90 | HR 73 | Ht 66.0 in | Wt 140.6 lb

## 2020-04-09 DIAGNOSIS — I10 Essential (primary) hypertension: Secondary | ICD-10-CM

## 2020-04-09 DIAGNOSIS — E785 Hyperlipidemia, unspecified: Secondary | ICD-10-CM | POA: Diagnosis not present

## 2020-04-09 DIAGNOSIS — I2510 Atherosclerotic heart disease of native coronary artery without angina pectoris: Secondary | ICD-10-CM | POA: Diagnosis not present

## 2020-04-09 DIAGNOSIS — R0602 Shortness of breath: Secondary | ICD-10-CM | POA: Diagnosis not present

## 2020-04-09 NOTE — Patient Instructions (Signed)
Medication Instructions:  The current medical regimen is effective;  continue present plan and medications as directed. Please refer to the Current Medication list given to you today. *If you need a refill on your cardiac medications before your next appointment, please call your pharmacy*  Lab Work:   Testing/Procedures:  NONE    NONE  Follow-Up: Your next appointment:  12 month(s) In Person with You may see Minus Breeding, MD or one of the following Advanced Practice Providers on your designated Care Team:  Rosaria Ferries, PA-CKathryn Purcell Nails, DNP, ANP  Please call our office 2 months in advance to schedule this appointment   At [facility], you and your health needs are our priority.  As part of our continuing mission to provide you with exceptional heart care, we have created designated Provider Care Teams.  These Care Teams include your primary Cardiologist (physician) and Advanced Practice Providers (APPs -  Physician Assistants and Nurse Practitioners) who all work together to provide you with the care you need, when you need it.

## 2020-04-12 DIAGNOSIS — R339 Retention of urine, unspecified: Secondary | ICD-10-CM | POA: Diagnosis not present

## 2020-04-13 DIAGNOSIS — N8189 Other female genital prolapse: Secondary | ICD-10-CM | POA: Diagnosis not present

## 2020-04-21 DIAGNOSIS — H353111 Nonexudative age-related macular degeneration, right eye, early dry stage: Secondary | ICD-10-CM | POA: Diagnosis not present

## 2020-04-21 DIAGNOSIS — H524 Presbyopia: Secondary | ICD-10-CM | POA: Diagnosis not present

## 2020-04-21 DIAGNOSIS — H26493 Other secondary cataract, bilateral: Secondary | ICD-10-CM | POA: Diagnosis not present

## 2020-04-21 DIAGNOSIS — H0100A Unspecified blepharitis right eye, upper and lower eyelids: Secondary | ICD-10-CM | POA: Diagnosis not present

## 2020-05-04 DIAGNOSIS — H26491 Other secondary cataract, right eye: Secondary | ICD-10-CM | POA: Diagnosis not present

## 2020-05-07 DIAGNOSIS — M810 Age-related osteoporosis without current pathological fracture: Secondary | ICD-10-CM | POA: Diagnosis not present

## 2020-05-13 DIAGNOSIS — R339 Retention of urine, unspecified: Secondary | ICD-10-CM | POA: Diagnosis not present

## 2020-05-24 ENCOUNTER — Ambulatory Visit: Payer: PPO | Admitting: Cardiology

## 2020-05-26 DIAGNOSIS — N8189 Other female genital prolapse: Secondary | ICD-10-CM | POA: Diagnosis not present

## 2020-06-01 DIAGNOSIS — H26492 Other secondary cataract, left eye: Secondary | ICD-10-CM | POA: Diagnosis not present

## 2020-06-09 DIAGNOSIS — F419 Anxiety disorder, unspecified: Secondary | ICD-10-CM | POA: Diagnosis not present

## 2020-06-09 DIAGNOSIS — R42 Dizziness and giddiness: Secondary | ICD-10-CM | POA: Diagnosis not present

## 2020-06-09 DIAGNOSIS — K219 Gastro-esophageal reflux disease without esophagitis: Secondary | ICD-10-CM | POA: Diagnosis not present

## 2020-06-11 DIAGNOSIS — N8189 Other female genital prolapse: Secondary | ICD-10-CM | POA: Diagnosis not present

## 2020-06-16 DIAGNOSIS — R339 Retention of urine, unspecified: Secondary | ICD-10-CM | POA: Diagnosis not present

## 2020-07-14 DIAGNOSIS — R339 Retention of urine, unspecified: Secondary | ICD-10-CM | POA: Diagnosis not present

## 2020-08-13 DIAGNOSIS — N390 Urinary tract infection, site not specified: Secondary | ICD-10-CM | POA: Diagnosis not present

## 2020-08-13 DIAGNOSIS — R339 Retention of urine, unspecified: Secondary | ICD-10-CM | POA: Diagnosis not present

## 2020-08-24 ENCOUNTER — Encounter (INDEPENDENT_AMBULATORY_CARE_PROVIDER_SITE_OTHER): Payer: PPO | Admitting: Ophthalmology

## 2020-08-26 ENCOUNTER — Encounter (INDEPENDENT_AMBULATORY_CARE_PROVIDER_SITE_OTHER): Payer: PPO | Admitting: Ophthalmology

## 2020-09-01 DIAGNOSIS — N8189 Other female genital prolapse: Secondary | ICD-10-CM | POA: Diagnosis not present

## 2020-09-02 DIAGNOSIS — N813 Complete uterovaginal prolapse: Secondary | ICD-10-CM | POA: Diagnosis not present

## 2020-09-09 DIAGNOSIS — N390 Urinary tract infection, site not specified: Secondary | ICD-10-CM | POA: Diagnosis not present

## 2020-09-09 DIAGNOSIS — R300 Dysuria: Secondary | ICD-10-CM | POA: Diagnosis not present

## 2020-09-13 DIAGNOSIS — R339 Retention of urine, unspecified: Secondary | ICD-10-CM | POA: Diagnosis not present

## 2020-09-14 ENCOUNTER — Encounter (INDEPENDENT_AMBULATORY_CARE_PROVIDER_SITE_OTHER): Payer: Self-pay | Admitting: Ophthalmology

## 2020-09-14 ENCOUNTER — Other Ambulatory Visit: Payer: Self-pay

## 2020-09-14 ENCOUNTER — Ambulatory Visit (INDEPENDENT_AMBULATORY_CARE_PROVIDER_SITE_OTHER): Payer: PPO | Admitting: Ophthalmology

## 2020-09-14 DIAGNOSIS — H353132 Nonexudative age-related macular degeneration, bilateral, intermediate dry stage: Secondary | ICD-10-CM | POA: Diagnosis not present

## 2020-09-14 DIAGNOSIS — H353222 Exudative age-related macular degeneration, left eye, with inactive choroidal neovascularization: Secondary | ICD-10-CM

## 2020-09-14 DIAGNOSIS — H35722 Serous detachment of retinal pigment epithelium, left eye: Secondary | ICD-10-CM

## 2020-09-14 DIAGNOSIS — H26491 Other secondary cataract, right eye: Secondary | ICD-10-CM | POA: Diagnosis not present

## 2020-09-14 NOTE — Assessment & Plan Note (Signed)
Small chronic serous subretinal collection subfoveal, stable in fact may be slightly less as compared to 7 months prior

## 2020-09-14 NOTE — Assessment & Plan Note (Signed)
Minor OD 

## 2020-09-14 NOTE — Assessment & Plan Note (Signed)
No signs of recurrence today

## 2020-09-14 NOTE — Progress Notes (Signed)
09/14/2020     CHIEF COMPLAINT Patient presents for No chief complaint on file.   HISTORY OF PRESENT ILLNESS: Erin Roth is a 87 y.o. female who presents to the clinic today for:   HPI   6 mos fu oct ou Patient states vision is stable and unchanged since last visit. Denies any new floaters or FOL.  Last edited by Laurin Coder, COA on 09/14/2020  1:50 PM.      Referring physician: [name], MD [address] [place],  [place] [address]  HISTORICAL INFORMATION:   Selected notes from the [facility]: No current outpatient medications on file. (Ophthalmic Drugs)   No current facility-administered medications for this visit. (Ophthalmic Drugs)   Current Outpatient Medications (Other)  Medication Sig   Ascorbic Acid (VITAMIN C) 1000 MG tablet Take 1,000 mg by mouth daily.   aspirin 81 MG tablet Take 81 mg by mouth daily.   atorvastatin (LIPITOR) 80 MG tablet TAKE 1 TABLET BY MOUTH DAILY.   cholecalciferol (VITAMIN D) 1000 UNITS tablet Take 1,000 Units by mouth daily.    denosumab (PROLIA) 60 MG/ML SOSY injection Inject 60 mg into the skin every 6 (six) months.   metoprolol tartrate (LOPRESSOR) 25 MG tablet 1/2 tablet   Multiple Vitamins-Minerals (ICAPS) CAPS Take 1 capsule by mouth every morning.   nitroGLYCERIN (NITROSTAT) 0.4 MG SL tablet 1 TABLET UNDER THE TONGUE EVERY 5 MINUTES FOR UP TO 3 DOSES AS NEEDEDFOR CHEST PAIN.   Omega-3 Fatty Acids (FISH OIL) 1000 MG CAPS Take 1 capsule by mouth daily.   SHINGRIX injection    traZODone (DESYREL) 50 MG tablet Take 50 mg by mouth at bedtime.   No current facility-administered medications for this visit. (Other)      REVIEW OF SYSTEMS:    ALLERGIES Allergies  Allergen Reactions   Estrogens     Breast pain   Pneumococcal Vaccines     rash   Bee Venom Rash   Lexapro [Escitalopram Oxalate] Rash    edema   Sulfa Antibiotics Rash    PAST MEDICAL  HISTORY Past Medical History:  Diagnosis Date   Anxiety    Bilateral bunions    CAD in native artery    a. NSTEMI 2/14 => LHC 03/08/12: Mid LAD 95% (hazy-suggestive of a ruptured plaque), EF 40%, distal anterior, apical and distal inferior HK =>  PCI: Xience DES to the mid LAD.   Cardiomyopathy ([place])    LVEF 45% on cath/echo 03/2012, suspected to be ischemic vs stress-induced   Depression    Diverticulosis    Fracture 2013   Right foot fracture- did not require surgery   Hyperlipidemia    Hypertension    Macular degeneration    Mild mitral regurgitation    a. Echo 03/09/12: Mild focal basal hypertrophy the septum, EF 45-50%, distal anteroseptal, anterolateral, inferolateral, inferoseptal and apical HK, grade 2 diastolic dysfunction, mild MR, PASP 52   Osteoporosis    Right knee meniscal tear 07/2016   Self-catheterizes urinary bladder    UTI (lower urinary tract infection)    Past Surgical History:  Procedure Laterality Date   ABDOMINAL HYSTERECTOMY  1980   BACK SURGERY  1986   CARPAL TUNNEL RELEASE Right MAy 2015   CATARACT EXTRACTION W/PHACO Left 12/15/2014   Dr. Kathrin Penner   CATARACT EXTRACTION W/PHACO Right 02/14/2015   Dr. Kathrin Penner   CORONARY ANGIOPLASTY WITH STENT PLACEMENT  03/11/12  95% mid LAD stenosis s/p DES, minimal nonobs dz elsewhere; LVEF 40%, moderate hypokinesis of distal anterior, apical and distal inferior walls, mildly elevated filling pressures, mild pulmonary HTN   LEFT HEART CATHETERIZATION WITH CORONARY ANGIOGRAM N/A 03/11/2012   Procedure: LEFT HEART CATHETERIZATION WITH CORONARY ANGIOGRAM;  Surgeon: Wellington Hampshire, MD;  Location: [facility] CATH LAB;  Service: Cardiovascular;  Laterality: N/A;   reclast  2013    FAMILY HISTORY Family History  Problem Relation Age of Onset   Colon cancer Father    Hypertension Mother    CAD Mother    Heart attack Mother    CAD Brother 46       CABG   Cancer - Lung Brother    Cancer - Lung Sister    Hypertension  Paternal Grandmother    Breast cancer Paternal Grandfather     SOCIAL HISTORY Social History   Tobacco Use   Smoking status: Never   Smokeless tobacco: Never  Vaping Use   Vaping Use: Never used  Substance Use Topics   Alcohol use: Yes    Comment: 4 glasses of wine per week   Drug use: No         OPHTHALMIC EXAM:  Base Eye Exam     Visual Acuity (ETDRS)       Right Left   Dist cc 20/20 20/40 -2   Dist ph cc  20/25 -1    Correction: Glasses         Tonometry (Tonopen, 1:53 PM)       Right Left   Pressure 15 14         Pupils       Pupils Dark Light Shape React APD   Right PERRL 3 2 Round Brisk None   Left PERRL 3 2 Round Brisk None         Visual Fields (Counting fingers)       Left Right    Full Full         Extraocular Movement       Right Left    Full Full         Neuro/Psych     Oriented x3: Yes   Mood/Affect: Normal         Dilation     Both eyes: 1.0% Mydriacyl, 2.5% Phenylephrine @ 1:53 PM           Slit Lamp and Fundus Exam     External Exam       Right Left   External Normal Normal         Slit Lamp Exam       Right Left   Lids/Lashes Normal Normal   Conjunctiva/Sclera White and quiet White and quiet   Cornea Clear Clear   Anterior Chamber Deep and quiet Deep and quiet   Iris Round and reactive Round and reactive   Lens Posterior chamber intraocular lens Posterior chamber intraocular lens   Anterior Vitreous Normal Normal         Fundus Exam       Right Left   Posterior Vitreous Central vitreous floaters, Posterior vitreous detachment Central vitreous floaters, Posterior vitreous detachment   Disc Normal Normal   C/D Ratio 0.45 0.3   Macula Soft drusen, no macular thickening, no exudates Soft drusen, no macular thickening, no exudates   Vessels Normal Normal   Periphery Normal Normal            IMAGING AND PROCEDURES  Imaging and Procedures  for 09/14/20  OCT, Retina - OU - Both  Eyes       Right Eye Quality was good. Scan locations included subfoveal. Central Foveal Thickness: 284. Progression has been stable. Findings include normal foveal contour, retinal drusen .   Left Eye Quality was good. Scan locations included subfoveal. Central Foveal Thickness: 248. Progression has been stable. Findings include abnormal foveal contour, retinal drusen .   Notes OD, no active maculopathy.  There is no active macular disease.  Subtle retinal drusen of early ARMD present.  OS chronic outer retinal/sub-RPE Fluid process temporally which has been present for out now for some years.  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 Serous detachment of retinal pigment epithelium of left eye  H35.722     2. Exudative age-related macular degeneration of left eye with inactive choroidal neovascularization ([place])  H35.3222     3. Intermediate stage nonexudative age-related macular degeneration of both eyes  H35.3132 OCT, Retina - OU - Both Eyes    4. Posterior capsular opacification, right  H26.491       1.  Serous retinal detachment left eye stable over time we will continue to observe  2.  No signs of recurrence of CNVM will observe  3.  Ophthalmic Meds Ordered this visit:  No orders of the defined types were placed in this encounter.      Return in about 7 months (around 04/14/2021) for DILATE OU, COLOR FP, OCT.  There are no Patient Instructions on  file for this visit.   Explained the diagnoses, plan, and follow up with the patient and they expressed understanding.  Patient expressed understanding of the importance of proper follow up care.   Clent Demark Dryden Tapley M.D. Diseases & Surgery of the Retina and Vitreous Retina & Diabetic [place] 09/14/20     Abbreviations: M myopia (nearsighted); A astigmatism; H hyperopia (farsighted); P presbyopia; Mrx spectacle prescription;  CTL contact lenses; OD right eye; OS left eye; OU both eyes  XT exotropia; ET esotropia; PEK punctate epithelial keratitis; PEE punctate epithelial erosions; DES dry eye syndrome; MGD meibomian gland dysfunction; ATs artificial tears; PFAT's preservative free artificial tears; [place] nuclear sclerotic cataract; PSC posterior subcapsular cataract; ERM epi-retinal membrane; PVD posterior vitreous detachment; RD retinal detachment; DM diabetes mellitus; DR diabetic retinopathy; NPDR non-proliferative diabetic retinopathy; PDR proliferative diabetic retinopathy; CSME clinically significant macular edema; DME diabetic macular edema; dbh dot blot hemorrhages; CWS cotton wool spot; POAG primary open angle glaucoma; C/D cup-to-disc ratio; HVF humphrey visual field; GVF goldmann visual field; OCT optical coherence tomography; IOP intraocular pressure; BRVO Branch retinal vein occlusion; CRVO central retinal vein occlusion; CRAO central retinal artery occlusion; BRAO branch retinal artery occlusion; RT retinal tear; SB scleral buckle; PPV pars plana vitrectomy; VH Vitreous hemorrhage; PRP panretinal laser photocoagulation; IVK intravitreal kenalog; VMT vitreomacular traction; MH Macular hole;  NVD neovascularization of the disc; NVE neovascularization elsewhere; AREDS age related eye disease study; ARMD age related macular degeneration; POAG primary  open angle glaucoma; EBMD epithelial/anterior basement membrane dystrophy; ACIOL anterior chamber intraocular lens; IOL intraocular lens; PCIOL  posterior chamber intraocular lens; Phaco/IOL phacoemulsification with intraocular lens placement; [place] photorefractive keratectomy; LASIK laser assisted in situ keratomileusis; HTN hypertension; DM diabetes mellitus; COPD chronic obstructive pulmonary disease

## 2020-09-14 NOTE — Assessment & Plan Note (Signed)
OU stable

## 2020-09-15 ENCOUNTER — Encounter (INDEPENDENT_AMBULATORY_CARE_PROVIDER_SITE_OTHER): Payer: PPO | Admitting: Ophthalmology

## 2020-09-27 DIAGNOSIS — N3020 Other chronic cystitis without hematuria: Secondary | ICD-10-CM | POA: Diagnosis not present

## 2020-09-27 DIAGNOSIS — N312 Flaccid neuropathic bladder, not elsewhere classified: Secondary | ICD-10-CM | POA: Diagnosis not present

## 2020-09-27 DIAGNOSIS — N8111 Cystocele, midline: Secondary | ICD-10-CM | POA: Diagnosis not present

## 2020-10-19 DIAGNOSIS — R339 Retention of urine, unspecified: Secondary | ICD-10-CM | POA: Diagnosis not present

## 2020-10-26 DIAGNOSIS — R339 Retention of urine, unspecified: Secondary | ICD-10-CM | POA: Diagnosis not present

## 2020-10-28 DIAGNOSIS — R339 Retention of urine, unspecified: Secondary | ICD-10-CM | POA: Diagnosis not present

## 2020-11-09 DIAGNOSIS — M810 Age-related osteoporosis without current pathological fracture: Secondary | ICD-10-CM | POA: Diagnosis not present

## 2020-11-22 DIAGNOSIS — N8189 Other female genital prolapse: Secondary | ICD-10-CM | POA: Diagnosis not present

## 2020-11-25 DIAGNOSIS — R339 Retention of urine, unspecified: Secondary | ICD-10-CM | POA: Diagnosis not present

## 2020-11-26 DIAGNOSIS — R339 Retention of urine, unspecified: Secondary | ICD-10-CM | POA: Diagnosis not present

## 2020-11-29 DIAGNOSIS — Z1231 Encounter for screening mammogram for malignant neoplasm of breast: Secondary | ICD-10-CM | POA: Diagnosis not present

## 2020-12-06 DIAGNOSIS — Z23 Encounter for immunization: Secondary | ICD-10-CM | POA: Diagnosis not present

## 2020-12-06 DIAGNOSIS — M810 Age-related osteoporosis without current pathological fracture: Secondary | ICD-10-CM | POA: Diagnosis not present

## 2020-12-06 DIAGNOSIS — I2510 Atherosclerotic heart disease of native coronary artery without angina pectoris: Secondary | ICD-10-CM | POA: Diagnosis not present

## 2020-12-06 DIAGNOSIS — H9193 Unspecified hearing loss, bilateral: Secondary | ICD-10-CM | POA: Diagnosis not present

## 2020-12-06 DIAGNOSIS — I10 Essential (primary) hypertension: Secondary | ICD-10-CM | POA: Diagnosis not present

## 2020-12-06 DIAGNOSIS — E785 Hyperlipidemia, unspecified: Secondary | ICD-10-CM | POA: Diagnosis not present

## 2020-12-06 DIAGNOSIS — B372 Candidiasis of skin and nail: Secondary | ICD-10-CM | POA: Diagnosis not present

## 2020-12-06 DIAGNOSIS — Z0000 Encounter for general adult medical examination without abnormal findings: Secondary | ICD-10-CM | POA: Diagnosis not present

## 2020-12-27 ENCOUNTER — Other Ambulatory Visit: Payer: Self-pay

## 2020-12-27 ENCOUNTER — Emergency Department (HOSPITAL_COMMUNITY)
Admission: EM | Admit: 2020-12-27 | Discharge: 2020-12-28 | Disposition: A | Discharge status: Home / Self Care | Payer: PPO | Attending: Emergency Medicine | Admitting: Emergency Medicine

## 2020-12-27 ENCOUNTER — Encounter (HOSPITAL_COMMUNITY): Payer: Self-pay

## 2020-12-27 ENCOUNTER — Emergency Department (HOSPITAL_COMMUNITY): Payer: PPO

## 2020-12-27 DIAGNOSIS — R112 Nausea with vomiting, unspecified: Secondary | ICD-10-CM | POA: Insufficient documentation

## 2020-12-27 DIAGNOSIS — I10 Essential (primary) hypertension: Secondary | ICD-10-CM | POA: Insufficient documentation

## 2020-12-27 DIAGNOSIS — Z955 Presence of coronary angioplasty implant and graft: Secondary | ICD-10-CM | POA: Insufficient documentation

## 2020-12-27 DIAGNOSIS — R1030 Lower abdominal pain, unspecified: Secondary | ICD-10-CM | POA: Diagnosis not present

## 2020-12-27 DIAGNOSIS — R1110 Vomiting, unspecified: Secondary | ICD-10-CM | POA: Diagnosis not present

## 2020-12-27 DIAGNOSIS — Z79899 Other long term (current) drug therapy: Secondary | ICD-10-CM | POA: Diagnosis not present

## 2020-12-27 DIAGNOSIS — I2510 Atherosclerotic heart disease of native coronary artery without angina pectoris: Secondary | ICD-10-CM | POA: Diagnosis not present

## 2020-12-27 DIAGNOSIS — R109 Unspecified abdominal pain: Secondary | ICD-10-CM | POA: Diagnosis not present

## 2020-12-27 DIAGNOSIS — K760 Fatty (change of) liver, not elsewhere classified: Secondary | ICD-10-CM | POA: Diagnosis not present

## 2020-12-27 DIAGNOSIS — Z7982 Long term (current) use of aspirin: Secondary | ICD-10-CM | POA: Diagnosis not present

## 2020-12-27 DIAGNOSIS — R339 Retention of urine, unspecified: Secondary | ICD-10-CM | POA: Diagnosis not present

## 2020-12-27 LAB — URINALYSIS, ROUTINE W REFLEX MICROSCOPIC
Bilirubin Urine: NEGATIVE
Glucose, UA: NEGATIVE mg/dL
Hgb urine dipstick: NEGATIVE
Ketones, ur: 20 mg/dL — AB
Leukocytes,Ua: NEGATIVE
Nitrite: NEGATIVE
Protein, ur: NEGATIVE mg/dL
Specific Gravity, Urine: 1.012 (ref 1.005–1.030)
pH: 7 (ref 5.0–8.0)

## 2020-12-27 LAB — COMPREHENSIVE METABOLIC PANEL
ALT: 23 U/L (ref 0–44)
AST: 32 U/L (ref 15–41)
Albumin: 4.6 g/dL (ref 3.5–5.0)
Alkaline Phosphatase: 56 U/L (ref 38–126)
Anion gap: 11 (ref 5–15)
BUN: 25 mg/dL — ABNORMAL HIGH (ref 8–23)
CO2: 23 mmol/L (ref 22–32)
Calcium: 10.7 mg/dL — ABNORMAL HIGH (ref 8.9–10.3)
Chloride: 100 mmol/L (ref 98–111)
Creatinine, Ser: 0.97 mg/dL (ref 0.44–1.00)
GFR, Estimated: 57 mL/min — ABNORMAL LOW (ref >60)
Glucose, Bld: 113 mg/dL — ABNORMAL HIGH (ref 70–99)
Potassium: 4.1 mmol/L (ref 3.5–5.1)
Sodium: 134 mmol/L — ABNORMAL LOW (ref 135–145)
Total Bilirubin: 0.6 mg/dL (ref 0.3–1.2)
Total Protein: 8.5 g/dL — ABNORMAL HIGH (ref 6.5–8.1)

## 2020-12-27 LAB — CBC WITH DIFFERENTIAL/PLATELET
Abs Immature Granulocytes: 0.06 10*3/uL (ref 0.00–0.07)
Basophils Absolute: 0.1 10*3/uL (ref 0.0–0.1)
Basophils Relative: 1 %
Eosinophils Absolute: 0 10*3/uL (ref 0.0–0.5)
Eosinophils Relative: 0 %
HCT: 46 % (ref 36.0–46.0)
Hemoglobin: 15.3 g/dL — ABNORMAL HIGH (ref 12.0–15.0)
Immature Granulocytes: 1 %
Lymphocytes Relative: 11 %
Lymphs Abs: 1.4 10*3/uL (ref 0.7–4.0)
MCH: 32.5 pg (ref 26.0–34.0)
MCHC: 33.3 g/dL (ref 30.0–36.0)
MCV: 97.7 fL (ref 80.0–100.0)
Monocytes Absolute: 0.8 10*3/uL (ref 0.1–1.0)
Monocytes Relative: 7 %
Neutro Abs: 10.4 10*3/uL — ABNORMAL HIGH (ref 1.7–7.7)
Neutrophils Relative %: 80 %
Platelets: 194 10*3/uL (ref 150–400)
RBC: 4.71 MIL/uL (ref 3.87–5.11)
RDW: 12.5 % (ref 11.5–15.5)
WBC: 12.8 10*3/uL — ABNORMAL HIGH (ref 4.0–10.5)
nRBC: 0 % (ref 0.0–0.2)

## 2020-12-27 LAB — LIPASE, BLOOD: Lipase: 38 U/L (ref 11–51)

## 2020-12-27 MED ORDER — IOHEXOL 350 MG/ML SOLN
80.0000 mL | Freq: Once | INTRAVENOUS | Status: AC | PRN
  Administered 2020-12-27: 21:00:00 80 mL via INTRAVENOUS

## 2020-12-27 MED ORDER — MORPHINE SULFATE (PF) 2 MG/ML IV SOLN
2.0000 mg | Freq: Once | INTRAVENOUS | Status: AC
  Administered 2020-12-27: 20:00:00 2 mg via INTRAVENOUS
  Filled 2020-12-27: qty 1

## 2020-12-27 MED ORDER — KETOROLAC TROMETHAMINE 15 MG/ML IJ SOLN
INTRAMUSCULAR | Status: AC
  Filled 2020-12-27: qty 1

## 2020-12-27 MED ORDER — KETOROLAC TROMETHAMINE 30 MG/ML IJ SOLN
15.0000 mg | Freq: Once | INTRAMUSCULAR | Status: AC
  Administered 2020-12-27: 23:00:00 15 mg via INTRAVENOUS

## 2020-12-27 MED ORDER — KETOROLAC TROMETHAMINE 30 MG/ML IJ SOLN
INTRAMUSCULAR | Status: AC
  Filled 2020-12-27: qty 1

## 2020-12-27 MED ORDER — ONDANSETRON HCL 4 MG/2ML IJ SOLN
4.0000 mg | Freq: Once | INTRAMUSCULAR | Status: AC
  Administered 2020-12-27: 20:00:00 4 mg via INTRAVENOUS
  Filled 2020-12-27: qty 2

## 2020-12-27 NOTE — ED Notes (Signed)
Pt self caths

## 2020-12-27 NOTE — ED Notes (Signed)
Consult to Urology@23 :15pm.

## 2020-12-27 NOTE — ED Provider Notes (Signed)
[facility] DEPT Provider Note   CSN: [id number] Arrival date & time: 12/27/20  1754     History Chief Complaint  Patient presents with   Flank Pain    Erin Roth is a 87 y.o. female.  Patient presents to the emergency department with a chief complaint of left-sided flank pain.  She states the symptoms started today at around 1 PM.  She reports associated nausea and vomiting.  She reports associated suprapubic pressure.  She denies any fevers or chills.  She self catheterizes and reports frequent urinary tract infections.  She states that she has been on a low-dose antibiotic, but does not recall which one.  She sees Dr[facility] Louis Meckel with [facility].  The history is provided by the patient. No language interpreter was used.      Past Medical History:  Diagnosis Date   Anxiety    Bilateral bunions    CAD in native artery    a. NSTEMI 2/14 => LHC 03/08/12: Mid LAD 95% (hazy-suggestive of a ruptured plaque), EF 40%, distal anterior, apical and distal inferior HK =>  PCI: Xience DES to the mid LAD.   Cardiomyopathy ([place])    LVEF 45% on cath/echo 03/2012, suspected to be ischemic vs stress-induced   Depression    Diverticulosis    Fracture 2013   Right foot fracture- did not require surgery   Hyperlipidemia    Hypertension    Macular degeneration    Mild mitral regurgitation    a. Echo 03/09/12: Mild focal basal hypertrophy the septum, EF 45-50%, distal anteroseptal, anterolateral, inferolateral, inferoseptal and apical HK, grade 2 diastolic dysfunction, mild MR, PASP 52   Osteoporosis    Right knee meniscal tear 07/2016   Self-catheterizes urinary bladder    UTI (lower urinary tract infection)     Patient Active Problem List   Diagnosis Date Noted   Posterior capsular opacification, right 02/26/2020   SOB (shortness of breath) 11/16/2019   Educated about COVID-19 virus infection 09/21/2019   Exudative age-related macular degeneration of  left eye with inactive choroidal neovascularization ([place]) 05/15/2019   Serous detachment of retinal pigment epithelium of left eye 05/15/2019   Intermediate stage nonexudative age-related macular degeneration of both eyes 05/15/2019   Asymmetric septal hypertrophy ([place]) 11/10/2018   Precordial chest pain 11/10/2018   Dyslipidemia 11/09/2017   Murmur 11/09/2017   History of MI (myocardial infarction) 07/08/2015   Dynamic left ventricular outflow obstruction 07/08/2015   Disturbance of skin sensation 02/19/2013   Anxiety 03/12/2012   Hyperlipidemia 03/12/2012   Urinary retention 03/12/2012   Essential hypertension 03/12/2012   CAD (coronary artery disease), native coronary artery 03/12/2012   Cardiomyopathy ([place]) 03/12/2012   Mild pulmonary hypertension ([place]) 03/12/2012   Mild mitral regurgitation 03/12/2012   NSTEMI (non-ST elevated myocardial infarction) ([place]) 03/09/2012    Past Surgical History:  Procedure Laterality Date   ABDOMINAL HYSTERECTOMY  1980   BACK SURGERY  1986   CARPAL TUNNEL RELEASE Right MAy 2015   CATARACT EXTRACTION W/PHACO Left 12/15/2014   Dr. Kathrin Penner   CATARACT EXTRACTION W/PHACO Right 02/14/2015   Dr. Kathrin Penner   CORONARY ANGIOPLASTY WITH STENT PLACEMENT  03/11/12   95% mid LAD stenosis s/p DES, minimal nonobs dz elsewhere; LVEF 40%, moderate hypokinesis of distal anterior, apical and distal inferior walls, mildly elevated filling pressures, mild pulmonary HTN   LEFT HEART CATHETERIZATION WITH CORONARY ANGIOGRAM N/A 03/11/2012   Procedure: LEFT HEART CATHETERIZATION WITH CORONARY ANGIOGRAM;  Surgeon: Wellington Hampshire,  MD;  Location: [facility] CATH [facility];  Service: Cardiovascular;  Laterality: N/A;   reclast  2013     OB History   No obstetric history on file.     Family History  Problem Relation Age of Onset   Colon cancer Father    Hypertension Mother    CAD Mother    Heart attack Mother    CAD Brother 78       CABG   Cancer - Lung Brother     Cancer - Lung Sister    Hypertension Paternal Grandmother    Breast cancer Paternal Grandfather     Social History   Tobacco Use   Smoking status: Never   Smokeless tobacco: Never  Vaping Use   Vaping Use: Never used  Substance Use Topics   Alcohol use: Yes    Comment: 4 glasses of wine per week   Drug use: No    Home Medications Prior to Admission medications   Medication Sig Start Date End Date Taking? Authorizing Provider  Ascorbic Acid (VITAMIN C) 1000 MG tablet Take 1,000 mg by mouth daily.    [provider]  aspirin 81 MG tablet Take 81 mg by mouth daily.    [provider]  atorvastatin (LIPITOR) 80 MG tablet TAKE 1 TABLET BY MOUTH DAILY. 02/24/20   Minus Breeding, MD  cholecalciferol (VITAMIN D) 1000 UNITS tablet Take 1,000 Units by mouth daily.     [provider]  denosumab (PROLIA) 60 MG/ML SOSY injection Inject 60 mg into the skin every 6 (six) months.    [provider]  metoprolol tartrate (LOPRESSOR) 25 MG tablet 1/2 tablet    [provider]  Multiple Vitamins-Minerals (ICAPS) CAPS Take 1 capsule by mouth every morning.    [provider]  nitroGLYCERIN (NITROSTAT) 0.4 MG SL tablet 1 TABLET UNDER THE TONGUE EVERY 5 MINUTES FOR UP TO 3 DOSES AS NEEDEDFOR CHEST PAIN. 11/11/18   Minus Breeding, MD  Omega-3 Fatty Acids (FISH OIL) 1000 MG CAPS Take 1 capsule by mouth daily.    [provider]  [facility] injection  12/16/18   [provider]  traZODone (DESYREL) 50 MG tablet Take 50 mg by mouth at bedtime.    [provider]    Allergies    Estrogens, Pneumococcal vaccines, Bee venom, Lexapro [escitalopram oxalate], and Sulfa antibiotics  Review of Systems   Review of Systems  All other systems reviewed and are negative.  Physical Exam Updated Vital Signs BP (!) 169/144   Pulse 76   Temp 98.3 F (36.8 C) (Oral)   Resp 18   SpO2 100%   Physical Exam Vitals and nursing note  reviewed.  Constitutional:      General: She is not in acute distress.    Appearance: She is well-developed.  HENT:     Head: Normocephalic and atraumatic.  Eyes:     Conjunctiva/sclera: Conjunctivae normal.  Cardiovascular:     Rate and Rhythm: Normal rate and regular rhythm.     Heart sounds: No murmur heard. Pulmonary:     Effort: Pulmonary effort is normal. No respiratory distress.     Breath sounds: Normal breath sounds.  Abdominal:     Palpations: Abdomen is soft.     Tenderness: There is abdominal tenderness.     Comments: Mild suprapubic abdominal tenderness  Musculoskeletal:        General: No swelling.     Cervical back: Neck supple.  Skin:    General:  Skin is warm and dry.     Capillary Refill: Capillary refill takes less than 2 seconds.  Neurological:     Mental Status: She is alert.  Psychiatric:        Mood and Affect: Mood normal.    ED Results / Procedures / Treatments   Labs (all labs ordered are listed, but only abnormal results are displayed) Labs Reviewed  CBC WITH DIFFERENTIAL/PLATELET - Abnormal; Notable for the following components:      Result Value   WBC 12.8 (*)    Hemoglobin 15.3 (*)    Neutro Abs 10.4 (*)    All other components within normal limits  COMPREHENSIVE METABOLIC PANEL - Abnormal; Notable for the following components:   Sodium 134 (*)    Glucose, Bld 113 (*)    BUN 25 (*)    Calcium 10.7 (*)    Total Protein 8.5 (*)    GFR, Estimated 57 (*)    All other components within normal limits  LIPASE, BLOOD  URINALYSIS, ROUTINE W REFLEX MICROSCOPIC    EKG None  Radiology CT ABDOMEN PELVIS W CONTRAST  Result Date: 12/27/2020 CLINICAL DATA:  Left flank pain, nausea, vomiting and chills. EXAM: CT ABDOMEN AND PELVIS WITH CONTRAST TECHNIQUE: Multidetector CT imaging of the abdomen and pelvis was performed using the standard protocol following bolus administration of intravenous contrast. CONTRAST:  82mL OMNIPAQUE IOHEXOL 350 MG/ML  SOLN COMPARISON:  CT with IV contrast 09/20/2015 FINDINGS: Lower chest: There are scattered linear scar-like opacities. Mild cardiomegaly without pericardial effusion. Moderate-sized hiatal hernia increased in size since prior study. Hepatobiliary: 18 cm in length liver, mildly steatotic with multiple scattered cysts, largest is septated in the posterior segment of the right hepatic lobe measuring 2.4 cm, previously 1.6 cm. There is no mass enhancement , biliary ductal dilatation or focal gallbladder abnormality. Pancreas: Unremarkable. Spleen: Unremarkable. Adrenals/Urinary Tract: There is no adrenal mass. Small chronic calcification noted in the lateral limb right adrenal gland. There are few small right renal hypodensities which are too small to characterize. There is no hydronephrosis on the right, no intrarenal stone either side. On the left, there is increased moderate to severe hydronephrosis to the UPJ, without visible UPJ or ureteral stone or ureteral dilatation. Multiple left renal cysts are again noted and there is increased left perinephric stranding and fluid, and asymmetric left renal cortical contrast retention the latter compatible with obstructive uropathy. The bladder wall and lumen are unremarkable. Stomach/Bowel: Moderate-sized hiatal hernia. The gastric wall and unopacified small bowel are unremarkable. There is a normal caliber appendix. No evidence of colitis or diverticulitis. Mild-to-moderate fecal stasis. Vascular/Lymphatic: Aortic atherosclerosis. No enlarged abdominal or pelvic lymph nodes. Reproductive: The uterus is absent.  No adnexal mass is seen. Other: There is left perinephric fluid but no other free fluid is seen. There is no free air, hemorrhage or abscess. Musculoskeletal: Small umbilical and right inguinal fat hernias. Multiple stable pelvic phleboliths. There is osteopenia, S shaped thoracolumbar scoliosis and advanced degenerative change of the lumbar spine with multilevel  spinous process abutment. There are chronic compression fractures of T12, L1 and L2. IMPRESSION: 1. Increased moderate to severe left hydronephrosis to the UPJ with cortical contrast retention consistent with obstructive uropathy, and increased perinephric stranding and fluid which could be due to a caliceal leak, obstructive uropathy or infectious process. There is no appreciable stone. Findings could be due to a recently passed stone, UPJ stenosis or stricture. Urology consult recommended. 2. Small scattered liver cysts with mild  hepatic steatosis. 3. Constipation, scattered diverticulosis. 4. Osteopenia, scoliosis, degenerative changes and chronic compression fractures. 5. Moderate-sized hiatal hernia increased from 2017. Electronically Signed   By: Telford Nab M.D.   On: 12/27/2020 21:52    Procedures Procedures   Medications Ordered in ED Medications  ketorolac (TORADOL) 30 MG/ML injection 15 mg (has no administration in time range)  ondansetron (ZOFRAN) injection 4 mg (4 mg Intravenous Given 12/27/20 2003)  morphine 2 MG/ML injection 2 mg (2 mg Intravenous Given 12/27/20 2005)  iohexol (OMNIPAQUE) 350 MG/ML injection 80 mL (80 mLs Intravenous Contrast Given 12/27/20 2119)    ED Course  I have reviewed the triage vital signs and the nursing notes.  Pertinent labs & imaging results that were available during my care of the patient were reviewed by me and considered in my medical decision making (see chart for details).    MDM Rules/Calculators/A&P                           Patient here with left-sided flank pain and suprapubic pain.  Has history of frequent UTIs due to self-catheterization.  Onset of symptoms was earlier today.  Labs are notable for mild leukocytosis of 12.8.  Urinalysis is pending.  Creatinine is 0.97, which is about baseline for patient.  CT abdomen/pelvis shows increased moderate to severe left hydronephrosis to the UPJ with cortical contrast retention.  This is  consistent with obstructive uropathy.  We will consult urology for further recommendations.  I spoke with Dr. Tacey Ruiz, from urology, who reviewed CT imaging with me.  After discussing patient's symptoms and findings today, plan is for discharge with outpatient follow-up.  She recommends that the patient continue the low-dose antibiotic that patient has been taking.  No further changes in care.  Patient states that her pain is significantly improved.  She appears stable for discharge.  Return precautions discussed. Final Clinical Impression(s) / ED Diagnoses Final diagnoses:  Left flank pain    Rx / DC Orders ED Discharge Orders     None        Montine Circle, PA-C 12/28/20 Enid Derry, MD 12/30/20 [phone]

## 2020-12-27 NOTE — ED Triage Notes (Signed)
Pt reports with left flank pain since 1 pm today.

## 2020-12-27 NOTE — ED Provider Notes (Signed)
Emergency Medicine Provider Triage Evaluation Note  Erin Roth , a 87 y.o. female  was evaluated in triage.  Pt complains of left flank pain, abdominal pain, nausea, vomiting, chills onset earlier today.  Patient self caths and is on prophylactic antibiotic for UTIs.   Review of Systems  Positive: See above Negative: Fever, constipation, diarrhea, hematuria  Physical Exam  BP (!) 185/104   Pulse 77   Temp 98.1 F (36.7 C) (Oral)   Resp 14   SpO2 96%  Gen:   Awake, no distress   Resp:  Normal effort  MSK:   Moves extremities without difficulty  Other:  Generalized abdominal tenderness present  Medical Decision Making  Medically screening exam initiated at 7:47 PM.  Appropriate orders placed.  Erin Roth was informed that the remainder of the evaluation will be completed by another provider, this initial triage assessment does not replace that evaluation, and the importance of remaining in the ED until their evaluation is complete.     Evlyn Courier, PA-C 12/27/20 1948    Wyvonnia Dusky, MD 12/28/20 0002

## 2020-12-28 MED ORDER — HYDROCODONE-ACETAMINOPHEN 5-325 MG PO TABS: 0.5000 | ORAL_TABLET | Freq: Four times a day (QID) | ORAL | 0 refills | Status: DC | PRN

## 2020-12-28 NOTE — Discharge Instructions (Signed)
Your CT scan showed some hydronephrosis, or increased fluid in and around the kidney.  I discussed this finding with Dr. Lahoma Crocker, the urologist on-call.  There was no sign of infection in your urine.  The urologist recommends that you follow-up in clinic.  No changes to your antibiotics at this time.  I have prescribed a small amount of pain medication in case your symptoms of pain return.  Please return to the emergency department if the pain medication does not control your pain.  Please call your urologist tomorrow for an appointment.

## 2020-12-29 LAB — URINE CULTURE: Culture: NO GROWTH

## 2021-01-04 DIAGNOSIS — N130 Hydronephrosis with ureteropelvic junction obstruction: Secondary | ICD-10-CM | POA: Diagnosis not present

## 2021-01-04 DIAGNOSIS — N8111 Cystocele, midline: Secondary | ICD-10-CM | POA: Diagnosis not present

## 2021-01-04 DIAGNOSIS — R339 Retention of urine, unspecified: Secondary | ICD-10-CM | POA: Diagnosis not present

## 2021-01-04 DIAGNOSIS — N312 Flaccid neuropathic bladder, not elsewhere classified: Secondary | ICD-10-CM | POA: Diagnosis not present

## 2021-01-10 ENCOUNTER — Other Ambulatory Visit: Payer: Self-pay | Admitting: Urology

## 2021-01-25 DIAGNOSIS — R339 Retention of urine, unspecified: Secondary | ICD-10-CM | POA: Diagnosis not present

## 2021-02-02 DIAGNOSIS — R3914 Feeling of incomplete bladder emptying: Secondary | ICD-10-CM | POA: Diagnosis not present

## 2021-02-09 ENCOUNTER — Telehealth: Payer: Self-pay | Admitting: Cardiology

## 2021-02-09 NOTE — Telephone Encounter (Signed)
Left a message for the patient to call back and speak to the on-call preop APP of the day 

## 2021-02-09 NOTE — Telephone Encounter (Signed)
° °  [facility] Pre-operative Risk Assessment    Request for surgical clearance:  What type of surgery is being performed?  Robotic Sacrocolpopexy  When is this surgery scheduled?  03/04/21   What type of clearance is required (medical clearance vs. Pharmacy clearance to hold med vs. Both)?  Both   Are there any medications that need to be held prior to surgery and how long? Their office is requesting our recommendation regarding medications.   Practice name and name of physician performing surgery?  [facility]   Dr. Louis Meckel   What is your office phone number? [phone] (ext: [phone])    7.   What is your office fax number? [phone]  8.   Anesthesia type (None, local, MAC, general) ? General    Zara Council 02/09/2021, 9:57 AM

## 2021-02-10 NOTE — Telephone Encounter (Signed)
Unable to reach patient via home phone.  Attempted to call the cell phone, however her mailbox is full and cannot leave message

## 2021-02-11 NOTE — Telephone Encounter (Signed)
I have called and informed the patient date and time of her appt with Dr. Percival Spanish on 1/27 at 9:30AM.

## 2021-02-11 NOTE — Telephone Encounter (Signed)
Patient returning call.

## 2021-02-11 NOTE — Telephone Encounter (Signed)
° °  Name: JALISSA HEINZELMAN  DOB: 04-27-1933  MRN: [id number]  Primary Cardiologist: Minus Breeding, MD  Chart reviewed as part of pre-operative protocol coverage. Because of Beretta A Brandenburger's past medical history and time since last visit, she will require a follow-up visit in order to better assess preoperative cardiovascular risk.  Pre-op covering staff: - Please schedule appointment and call patient to inform them. If patient already had an upcoming appointment within acceptable timeframe, please add "pre-op clearance" to the appointment notes so provider is aware. - Please contact requesting surgeon's office via preferred method (i.e, phone, fax) to inform them of need for appointment prior to surgery.  If applicable, this message will also be routed to pharmacy pool and/or primary cardiologist for input on holding anticoagulant/antiplatelet agent as requested below so that this information is available to the clearing provider at time of patient's appointment.   Patient prefers to see Dr. Percival Spanish, only will consider other providers if Dr. Percival Spanish absolute does not have any opening. Surgery date is 03/04/2021  Almyra Deforest, [place]  02/11/2021, 4:41 PM

## 2021-02-14 NOTE — Telephone Encounter (Signed)
Pt has appt with Dr. Percival Spanish 02/25/21 for pre op clearance. I will forward clearance notes to MD for upcoming appt. Will send FYI to requesting office the pt has appt 1/127/23

## 2021-02-15 NOTE — Progress Notes (Addendum)
COVID swab appointment: 03/02/21 @ 1200  COVID Vaccine Completed: yes Date COVID Vaccine completed: 12/13/19 Has received booster: COVID vaccine manufacturer: [employer]   Date of COVID positive in last 90 days: no  PCP - [name], MD Cardiologist - Minus Breeding, MD  Chest x-ray - n/a EKG - 04/09/20 Epic Stress Test - around time of heart attack per pt ECHO - 11/04/19 Epic Cardiac Cath - 03/11/12 Pacemaker/ICD device last checked: n/a Spinal Cord Stimulator: n/a  Sleep Study - n/a CPAP -   Fasting Blood Sugar - n/a Checks Blood Sugar _____ times a day  Blood Thinner Instructions: Aspirin Instructions: ASA 81, hold 5 days before surgery Last Dose:  Activity level: Can go up a flight of stairs and perform activities of daily living without stopping and without symptoms of chest pain or shortness of breath.       Anesthesia review: CAD, HTN, SOB, need cardiac clearance, cardiomyopathy, NSTEMI  Patient denies shortness of breath, fever, cough and chest pain at PAT appointment   Patient verbalized understanding of instructions that were given to them at the PAT appointment. Patient was also instructed that they will need to review over the PAT instructions again at home before surgery.

## 2021-02-16 ENCOUNTER — Encounter (HOSPITAL_COMMUNITY)
Admission: RE | Admit: 2021-02-16 | Discharge: 2021-02-16 | Disposition: A | Discharge status: Home / Self Care | Payer: PPO | Source: Ambulatory Visit | Attending: Urology | Admitting: Urology

## 2021-02-16 ENCOUNTER — Other Ambulatory Visit: Payer: Self-pay

## 2021-02-16 ENCOUNTER — Encounter (HOSPITAL_COMMUNITY): Payer: Self-pay

## 2021-02-16 VITALS — BP 132/76 | HR 91 | Temp 98.2°F | Resp 14 | Ht 65.0 in | Wt 140.8 lb

## 2021-02-16 DIAGNOSIS — Z01812 Encounter for preprocedural laboratory examination: Secondary | ICD-10-CM | POA: Diagnosis not present

## 2021-02-16 DIAGNOSIS — Z01818 Encounter for other preprocedural examination: Secondary | ICD-10-CM

## 2021-02-16 HISTORY — DX: Acute myocardial infarction, unspecified: I21.9

## 2021-02-16 HISTORY — DX: Malignant (primary) neoplasm, unspecified: C80.1

## 2021-02-16 LAB — CBC
HCT: 42.6 % (ref 36.0–46.0)
Hemoglobin: 14.4 g/dL (ref 12.0–15.0)
MCH: 33.2 pg (ref 26.0–34.0)
MCHC: 33.8 g/dL (ref 30.0–36.0)
MCV: 98.2 fL (ref 80.0–100.0)
Platelets: 185 10*3/uL (ref 150–400)
RBC: 4.34 MIL/uL (ref 3.87–5.11)
RDW: 12.6 % (ref 11.5–15.5)
WBC: 6.5 10*3/uL (ref 4.0–10.5)
nRBC: 0 % (ref 0.0–0.2)

## 2021-02-16 LAB — COMPREHENSIVE METABOLIC PANEL
ALT: 22 U/L (ref 0–44)
AST: 30 U/L (ref 15–41)
Albumin: 4.2 g/dL (ref 3.5–5.0)
Alkaline Phosphatase: 49 U/L (ref 38–126)
Anion gap: 8 (ref 5–15)
BUN: 17 mg/dL (ref 8–23)
CO2: 25 mmol/L (ref 22–32)
Calcium: 9.7 mg/dL (ref 8.9–10.3)
Chloride: 103 mmol/L (ref 98–111)
Creatinine, Ser: 0.89 mg/dL (ref 0.44–1.00)
GFR, Estimated: >60 mL/min (ref >60)
Glucose, Bld: 98 mg/dL (ref 70–99)
Potassium: 4.4 mmol/L (ref 3.5–5.1)
Sodium: 136 mmol/L (ref 135–145)
Total Bilirubin: 0.6 mg/dL (ref 0.3–1.2)
Total Protein: 7.4 g/dL (ref 6.5–8.1)

## 2021-02-16 NOTE — Patient Instructions (Addendum)
DUE TO COVID-19 ONLY ONE VISITOR IS ALLOWED TO COME WITH YOU AND STAY IN THE WAITING ROOM ONLY DURING PRE OP AND PROCEDURE.   **NO VISITORS ARE ALLOWED IN THE SHORT STAY AREA OR RECOVERY ROOM!!**  IF YOU WILL BE ADMITTED INTO THE HOSPITAL YOU ARE ALLOWED ONLY TWO SUPPORT PEOPLE DURING VISITATION HOURS ONLY (7 AM -8PM)   The support person(s) must pass our screening, gel in and out, and wear a mask at all times, including in the patients room. Patients must also wear a mask when staff or their support person are in the room. Visitors GUEST BADGE MUST BE WORN VISIBLY  One adult visitor may remain with you overnight and MUST be in the room by 8 P.M.  No visitors under the age of 20. Any visitor under the age of 30 must be accompanied by an adult.    COVID SWAB TESTING MUST BE COMPLETED ON:  03/02/21 @ 12:00 PM   Site: [facility] Enter: Main Entrance have a seat in the waiting area to the right of main entrance (DO NOT [place]!!!!!) Dial: [phone] to alert staff you have arrived  You are not required to quarantine, however you are required to wear a well-fitted mask when you are out and around people not in your household.  Hand Hygiene often Do NOT share personal items Notify your provider if you are in close contact with someone who has COVID or you develop fever 100.4 or greater, new onset of sneezing, cough, sore throat, shortness of breath or body aches.       Your procedure is scheduled on: 03/04/21   Report to [facility] Main Entrance    Report to admitting at 5:15 AM   Call this number if you have problems the morning of surgery [phone]   Do not eat food :After Midnight.   May have liquids until 4:30 AM day of surgery  CLEAR LIQUID DIET  Foods Allowed                                                                     Foods Excluded  Water, Black Coffee and tea (no milk or creamer)           liquids  that you cannot  Plain Jell-O in any flavor  (No red)                                    see through such as: Fruit ices (not with fruit pulp)                                           milk, soups, orange juice              Iced Popsicles (No red)  All solid food                                   Apple juices Sports drinks like Gatorade (No red) Lightly seasoned clear broth or consume(fat free) Sugar    Oral Hygiene is also important to reduce your risk of infection.                                    Remember - BRUSH YOUR TEETH THE MORNING OF SURGERY WITH YOUR REGULAR TOOTHPASTE   Take these medicines the morning of surgery with A SIP OF WATER: Lipitor                              You may not have any metal on your body including hair pins, jewelry, and body piercing             Do not wear make-up, lotions, powders, perfumes, or deodorant  Do not wear nail polish including gel and S&S, artificial/acrylic nails, or any other type of covering on natural nails including finger and toenails. If you have artificial nails, gel coating, etc. that needs to be removed by a nail salon please have this removed prior to surgery or surgery may need to be canceled/ delayed if the surgeon/ anesthesia feels like they are unable to be safely monitored.   Do not shave  48 hours prior to surgery.    Do not bring valuables to the hospital. [place].   Bring small overnight bag day of surgery.   Special Instructions: Bring a copy of your healthcare power of attorney and living will documents         the day of surgery if you haven't scanned them before.              Please read over the following fact sheets you were given: IF YOU HAVE QUESTIONS ABOUT YOUR PRE-OP INSTRUCTIONS PLEASE CALL [facility] - Preparing for Surgery Before surgery, you can play an important role.  Because skin is  not sterile, your skin needs to be as free of germs as possible.  You can reduce the number of germs on your skin by washing with CHG (chlorahexidine gluconate) soap before surgery.  CHG is an antiseptic cleaner which kills germs and bonds with the skin to continue killing germs even after washing. Please DO NOT use if you have an allergy to CHG or antibacterial soaps.  If your skin becomes reddened/irritated stop using the CHG and inform your nurse when you arrive at Short Stay. Do not shave (including legs and underarms) for at least 48 hours prior to the first CHG shower.  You may shave your face/neck.  Please follow these instructions carefully:  1.  Shower with CHG Soap the night before surgery and the  morning of surgery.  2.  If you choose to wash your hair, wash your hair first as usual with your normal  shampoo.  3.  After you shampoo, rinse your hair and body thoroughly to remove the shampoo.  4.  Use CHG as you would any other liquid soap.  You can apply chg directly to the skin and wash.  Gently with a scrungie or clean washcloth.  5.  Apply the CHG Soap to your body ONLY FROM THE NECK DOWN.   Do   not use on face/ open                           Wound or open sores. Avoid contact with eyes, ears mouth and   genitals (private parts).                       Wash face,  Genitals (private parts) with your normal soap.             6.  Wash thoroughly, paying special attention to the area where your    surgery  will be performed.  7.  Thoroughly rinse your body with warm water from the neck down.  8.  DO NOT shower/wash with your normal soap after using and rinsing off the CHG Soap.                9.  Pat yourself dry with a clean towel.            10.  Wear clean pajamas.            11.  Place clean sheets on your bed the night of your first shower and do not  sleep with pets. Day of Surgery : Do not apply any lotions/deodorants the morning of surgery.  Please wear  clean clothes to the hospital/surgery center.  FAILURE TO FOLLOW THESE INSTRUCTIONS MAY RESULT IN THE CANCELLATION OF YOUR SURGERY  PATIENT SIGNATURE_________________________________  NURSE SIGNATURE__________________________________  ________________________________________________________________________  WHAT IS A BLOOD TRANSFUSION? Blood Transfusion Information  A transfusion is the replacement of blood or some of its parts. Blood is made up of multiple cells which provide different functions. Red blood cells carry oxygen and are used for blood loss replacement. White blood cells fight against infection. Platelets control bleeding. Plasma helps clot blood. Other blood products are available for specialized needs, such as hemophilia or other clotting disorders. BEFORE THE TRANSFUSION  Who gives blood for transfusions?  Healthy volunteers who are fully evaluated to make sure their blood is safe. This is blood bank blood. Transfusion therapy is the safest it has ever been in the practice of medicine. Before blood is taken from a donor, a complete history is taken to make sure that person has no history of diseases nor engages in risky social behavior (examples are intravenous drug use or sexual activity with multiple partners). The donor's travel history is screened to minimize risk of transmitting infections, such as malaria. The donated blood is tested for signs of infectious diseases, such as HIV and hepatitis. The blood is then tested to be sure it is compatible with you in order to minimize the chance of a transfusion reaction. If you or a relative donates blood, this is often done in anticipation of surgery and is not appropriate for emergency situations. It takes many days to process the donated blood. RISKS AND COMPLICATIONS Although transfusion therapy is very safe and saves many lives, the main dangers of transfusion include:  Getting an infectious disease. Developing a transfusion  reaction. This is an allergic reaction to something in the blood you were given. Every precaution is taken to prevent this. The decision to  have a blood transfusion has been considered carefully by your caregiver before blood is given. Blood is not given unless the benefits outweigh the risks. AFTER THE TRANSFUSION Right after receiving a blood transfusion, you will usually feel much better and more energetic. This is especially true if your red blood cells have gotten low (anemic). The transfusion raises the level of the red blood cells which carry oxygen, and this usually causes an energy increase. The nurse administering the transfusion will monitor you carefully for complications. HOME CARE INSTRUCTIONS  No special instructions are needed after a transfusion. You may find your energy is better. Speak with your caregiver about any limitations on activity for underlying diseases you may have. SEEK MEDICAL CARE IF:  Your condition is not improving after your transfusion. You develop redness or irritation at the intravenous (IV) site. SEEK IMMEDIATE MEDICAL CARE IF:  Any of the following symptoms occur over the next 12 hours: Shaking chills. You have a temperature by mouth above 102 F (38.9 C), not controlled by medicine. Chest, back, or muscle pain. People around you feel you are not acting correctly or are confused. Shortness of breath or difficulty breathing. Dizziness and fainting. You get a rash or develop hives. You have a decrease in urine output. Your urine turns a dark color or changes to pink, red, or brown. Any of the following symptoms occur over the next 10 days: You have a temperature by mouth above 102 F (38.9 C), not controlled by medicine. Shortness of breath. Weakness after normal activity. The white part of the eye turns yellow (jaundice). You have a decrease in the amount of urine or are urinating less often. Your urine turns a dark color or changes to pink, red, or  brown. Document Released: 01/14/2000 Document Revised: 04/10/2011 Document Reviewed: 09/02/2007 [facility] Patient Information 2014 [place], [place].  _______________________________________________________________________

## 2021-02-17 DIAGNOSIS — N312 Flaccid neuropathic bladder, not elsewhere classified: Secondary | ICD-10-CM | POA: Diagnosis not present

## 2021-02-17 DIAGNOSIS — N8111 Cystocele, midline: Secondary | ICD-10-CM | POA: Diagnosis not present

## 2021-02-17 LAB — URINE CULTURE: Culture: NO GROWTH

## 2021-02-24 NOTE — Progress Notes (Signed)
Cardiology Office Note   Date:  02/25/2021   ID:  MARILY Roth, DOB [birth year], MRN [id number]  PCP:  [name], MD  Cardiologist:   Minus Breeding, MD   Chief Complaint  Patient presents with   Pre-op Exam      History of Present Illness: Erin Roth is a 88 y.o. female who presents for evaluation of CAD.  She had PCI of an LAD ruptured plaque in 2014. At that time she presented with jaw pain.  In 2017 she was in the hospital with chest pain.   This was atypical and not felt to be anginal.  She had an echo which showed dynamic outflow obstruction with Valsalva with a peak gradient of 13 mm Hg.   I sent her for a POET (Plain Old Exercise Treadmill) which was negative. She was SOB and I sent her for an echo.  It was essentially unremarkable but there was no Valsalva to evaluate for possible dynamic outflow obstruction.  She had SOB associated with ACE inhibitor.    She presents for follow-up.  She also is preop prior to sacrocolpopexy.  She has been doing well.  She denies any cardiovascular symptoms.  She actually walks on a treadmill routinely.  The patient denies any new symptoms such as chest discomfort, neck or arm discomfort. There has been no new shortness of breath, PND or orthopnea. There have been no reported palpitations, presyncope or syncope.    Past Medical History:  Diagnosis Date   Anxiety    Bilateral bunions    CAD in native artery    a. NSTEMI 2/14 => LHC 03/08/12: Mid LAD 95% (hazy-suggestive of a ruptured plaque), EF 40%, distal anterior, apical and distal inferior HK =>  PCI: Xience DES to the mid LAD.   Cancer ([place])    skin cancer   Cardiomyopathy ([place])    LVEF 45% on cath/echo 03/2012, suspected to be ischemic vs stress-induced   Depression    Diverticulosis    Fracture 2013   Right foot fracture- did not require surgery   Hyperlipidemia    Hypertension    Macular degeneration    Mild mitral regurgitation    a. Echo 03/09/12: Mild focal basal  hypertrophy the septum, EF 45-50%, distal anteroseptal, anterolateral, inferolateral, inferoseptal and apical HK, grade 2 diastolic dysfunction, mild MR, PASP 52   Myocardial infarction ([place])    Osteoporosis    Right knee meniscal tear 07/2016   Self-catheterizes urinary bladder    UTI (lower urinary tract infection)     Past Surgical History:  Procedure Laterality Date   ABDOMINAL HYSTERECTOMY  1980   BACK SURGERY  1986   CARPAL TUNNEL RELEASE Right 05/2013   CATARACT EXTRACTION W/PHACO Left 12/15/2014   Dr. Kathrin Penner   CATARACT EXTRACTION W/PHACO Right 02/14/2015   Dr. Kathrin Penner   CORONARY ANGIOPLASTY WITH STENT PLACEMENT  03/11/2012   95% mid LAD stenosis s/p DES, minimal nonobs dz elsewhere; LVEF 40%, moderate hypokinesis of distal anterior, apical and distal inferior walls, mildly elevated filling pressures, mild pulmonary HTN   LEFT HEART CATHETERIZATION WITH CORONARY ANGIOGRAM N/A 03/11/2012   Procedure: LEFT HEART CATHETERIZATION WITH CORONARY ANGIOGRAM;  Surgeon: Wellington Hampshire, MD;  Location: [facility] C[facility];  Service: Cardiovascular;  Laterality: N/A;   reclast  2013   SKIN CANCER EXCISION       Current Outpatient Medications  Medication Sig Dispense Refill   ALPRAZolam (XANAX) 0.25 MG tablet Take 0.25 mg by mouth  at bedtime as needed for anxiety.     Ascorbic Acid (VITAMIN C) 1000 MG tablet Take 1,000 mg by mouth daily.     aspirin 81 MG tablet Take 81 mg by mouth daily.     cholecalciferol (VITAMIN D) 1000 UNITS tablet Take 1,000 Units by mouth daily.      denosumab (PROLIA) 60 MG/ML SOSY injection Inject 60 mg into the skin every 6 (six) months.     HYDROcodone-acetaminophen (NORCO/VICODIN) 5-325 MG tablet Take 0.5 tablets by mouth every 6 (six) hours as needed. 10 tablet 0   Multiple Vitamins-Minerals (PRESERVISION AREDS 2 PO) Take 1 capsule by mouth in the morning and at bedtime.     nitrofurantoin (MACRODANTIN) 50 MG capsule Take 50 mg by mouth at bedtime.      Probiotic Product (ALIGN) 4 MG CAPS Take 4 mg by mouth daily.     SHINGRIX injection      atorvastatin (LIPITOR) 80 MG tablet Take 1 tablet (80 mg total) by mouth daily. 90 tablet 3   lisinopril (ZESTRIL) 5 MG tablet Take 1 tablet (5 mg total) by mouth daily. OR AS DIRECTED BY PHYSICIAN 90 tablet 3   nitroGLYCERIN (NITROSTAT) 0.4 MG SL tablet 1 TABLET UNDER THE TONGUE EVERY 5 MINUTES FOR UP TO 3 DOSES AS NEEDEDFOR CHEST PAIN. 25 tablet PRN   No current facility-administered medications for this visit.    Allergies:   Bee venom, Estrogens, Pneumococcal vaccines, Lexapro [escitalopram oxalate], and Sulfa antibiotics    ROS:  Please see the history of present illness.   Otherwise, review of systems are positive for none.   All other systems are reviewed and negative.    PHYSICAL EXAM: VS:  BP 138/90    Pulse 72    Ht 5\' 4"  (1.626 m)    Wt 143 lb 9.6 oz (65.1 kg)    SpO2 90%    BMI 24.65 kg/m  , BMI Body mass index is 24.65 kg/m. GENERAL:  Well appearing NECK:  No jugular venous distention, waveform within normal limits, carotid upstroke brisk and symmetric, no bruits, no thyromegaly LUNGS:  Clear to auscultation bilaterally CHEST:  Unremarkable HEART:  PMI not displaced or sustained,S1 and S2 within normal limits, no S3, no S4, no clicks, no rubs, brief apical systolic murmur nonradiating, no diastolic murmurs ABD:  Flat, positive bowel sounds normal in frequency in pitch, no bruits, no rebound, no guarding, no midline pulsatile mass, no hepatomegaly, no splenomegaly EXT:  2 plus pulses throughout, no edema, no cyanosis no clubbing    EKG:  EKG is ordered today. Sinus rhythm, rate 72, axis within normal limits, intervals within normal limits, no acute ST-T wave changes.  Recent Labs: 02/16/2021: ALT 22; BUN 17; Creatinine, Ser 0.89; Hemoglobin 14.4; Platelets 185; Potassium 4.4; Sodium 136    Lipid Panel    Component Value Date/Time   CHOL 123 11/18/2018 0956   TRIG 108 11/18/2018  0956   HDL 56 11/18/2018 0956   CHOLHDL 2.2 11/18/2018 0956   CHOLHDL 2.3 12/02/2013 0902   VLDL 24 12/02/2013 0902   LDLCALC 47 11/18/2018 0956      Wt Readings from Last 3 Encounters:  02/25/21 143 lb 9.6 oz (65.1 kg)  02/16/21 140 lb 12.8 oz (63.9 kg)  04/09/20 140 lb 9.6 oz (63.8 kg)      Other studies Reviewed: Additional studies/ records that were reviewed today include: Labs Review of the above records demonstrates: See elsewhere   ASSESSMENT AND PLAN:  CAD:   The patient has no new sypmtoms.  No further cardiovascular testing is indicated.  We will continue with aggressive risk reduction and meds as listed.  HTN:  The blood pressure is at target.  She requests permission to hold her lisinopril on days when her blood pressure is low and she has been doing this and it works well for her so she will continue with this.[name]   HYPERLIPIDEMIA:      Her LDL is 154.  This is up from 78.  She had stopped taking her Lipitor.  She recently restarted this and I will defer to Dr. Dema Severin to follow-up with her lipids.  I discussed with her the importance of this.  She agrees to continue.   PREOP: The patient is at acceptable risk for the planned surgery.  She has a high functional status.  She is not going for high risk procedure.  She has no high risk findings or symptoms.  Therefore, according to ACC/AHA guidelines no further testing is indicated.   Current medicines are reviewed at length with the patient today.  The patient does not have concerns regarding medicines.  The following changes have been made: None.   Labs/ tests ordered today include: None  Orders Placed This Encounter  Procedures   EKG 12-Lead     Disposition:   FU with me as needed   Signed, Minus Breeding, MD  02/25/2021 10:31 AM    [place]

## 2021-02-25 ENCOUNTER — Other Ambulatory Visit: Payer: Self-pay

## 2021-02-25 ENCOUNTER — Encounter: Payer: Self-pay | Admitting: Cardiology

## 2021-02-25 ENCOUNTER — Ambulatory Visit: Payer: PPO | Admitting: Cardiology

## 2021-02-25 VITALS — BP 138/90 | HR 72 | Ht 64.0 in | Wt 143.6 lb

## 2021-02-25 DIAGNOSIS — I2510 Atherosclerotic heart disease of native coronary artery without angina pectoris: Secondary | ICD-10-CM | POA: Diagnosis not present

## 2021-02-25 DIAGNOSIS — E785 Hyperlipidemia, unspecified: Secondary | ICD-10-CM | POA: Diagnosis not present

## 2021-02-25 DIAGNOSIS — I10 Essential (primary) hypertension: Secondary | ICD-10-CM | POA: Diagnosis not present

## 2021-02-25 DIAGNOSIS — R0602 Shortness of breath: Secondary | ICD-10-CM

## 2021-02-25 MED ORDER — NITROGLYCERIN 0.4 MG SL SUBL: SUBLINGUAL_TABLET | SUBLINGUAL | 99 refills | Status: DC

## 2021-02-25 MED ORDER — ATORVASTATIN CALCIUM 80 MG PO TABS: 80.0000 mg | ORAL_TABLET | Freq: Every day | ORAL | 3 refills | Status: DC

## 2021-02-25 MED ORDER — LISINOPRIL 5 MG PO TABS: 5.0000 mg | ORAL_TABLET | Freq: Every day | ORAL | 3 refills | Status: DC

## 2021-02-25 NOTE — Anesthesia Preprocedure Evaluation (Addendum)
Anesthesia Evaluation  Patient identified by MRN, date of birth, ID band Patient awake    Reviewed: Allergy & Precautions, NPO status , Patient's Chart, lab work & pertinent test results  Airway Mallampati: II  TM Distance: >3 FB Neck ROM: Full    Dental no notable dental hx. (+) Dental Advisory Given   Pulmonary neg pulmonary ROS,    Pulmonary exam normal breath sounds clear to auscultation       Cardiovascular hypertension (148/79), Pt. on medications + CAD, + Past MI (NSTEMI 2014), + Cardiac Stents (DES to LAD 2014) and +CHF (grade 1 diastolic dysfunction, normal LVEF on echo 2021)  Normal cardiovascular exam+ Valvular Problems/Murmurs (mild MR) MR  Rhythm:Regular Rate:Normal  Echo 2021: 1. Left ventricular ejection fraction, by estimation, is 60 to 65%. The  left ventricle has normal function. The left ventricle has no regional  wall motion abnormalities. There is moderate asymmetric left ventricular  hypertrophy of the basal-septal  segment. Left ventricular diastolic parameters are consistent with Grade I  diastolic dysfunction (impaired relaxation). Elevated left atrial  pressure.  2. Right ventricular systolic function is normal. The right ventricular  size is normal.  3. The mitral valve is normal in structure. Mild mitral valve  regurgitation. No evidence of mitral stenosis.  4. The aortic valve is normal in structure. There is mild calcification  of the aortic valve. There is mild thickening of the aortic valve. Aortic  valve regurgitation is mild. Mild to moderate aortic valve  sclerosis/calcification is present, without any  evidence of aortic stenosis.  5. The inferior vena cava is normal in size with greater than 50%  respiratory variability, suggesting right atrial pressure of 3 mmHg.    Neuro/Psych PSYCHIATRIC DISORDERS Anxiety Depression negative neurological ROS     GI/Hepatic negative GI ROS, Neg  liver ROS,   Endo/Other  negative endocrine ROS  Renal/GU negative Renal ROS   Cystocele     Musculoskeletal negative musculoskeletal ROS (+)   Abdominal   Peds  Hematology negative hematology ROS (+)   Anesthesia Other Findings   Reproductive/Obstetrics negative OB ROS                          Anesthesia Physical Anesthesia Plan  ASA: 2  Anesthesia Plan: General   Post-op Pain Management: Tylenol PO (pre-op)   Induction: Intravenous  PONV Risk Score and Plan: 4 or greater and Ondansetron, Dexamethasone and Treatment may vary due to age or medical condition  Airway Management Planned: Oral ETT  Additional Equipment: None  Intra-op Plan:   Post-operative Plan: Extubation in OR  Informed Consent: I have reviewed the patients History and Physical, chart, labs and discussed the procedure including the risks, benefits and alternatives for the proposed anesthesia with the patient or authorized representative who has indicated his/her understanding and acceptance.     Dental advisory given  Plan Discussed with: CRNA  Anesthesia Plan Comments: (clearsight blood pressure monitoring )     Anesthesia Quick Evaluation

## 2021-02-25 NOTE — Progress Notes (Signed)
Anesthesia Chart Review  Case: [id number] Date/Time: 03/04/21 0715   Procedure: XI ROBOTIC ASSISTED LAPAROSCOPIC SACROCOLPOPEXY   Anesthesia type: General   Pre-op diagnosis: CYSTOCELE   Location: [facility] ROOM 03 / [facility]   Surgeons: [name], MD       DISCUSSION:87 y.o. never smoker with h/o HTN, CHF, CAD (PCI of LAD 2014), cystocele scheduled for above procedure 03/04/21 with Dr. Louis Meckel.   Pt seen by cardiology 02/25/2021. Per OV note, "PREOP: The patient is at acceptable risk for the planned surgery.  She has a high functional status.  She is not going for high risk procedure.  She has no high risk findings or symptoms.  Therefore, according to ACC/AHA guidelines no further testing is indicated."  Anticipate pt can proceed with planned procedure barring acute status change.   VS: BP 132/76    Pulse 91    Temp 36.8 C (Oral)    Resp 14    Ht 5\' 5"  (1.651 m)    Wt 63.9 kg    SpO2 96%    BMI 23.43 kg/m   PROVIDERS: [name], MD is PCP   Minus Breeding, MD is Cardiologist  LABS: Labs reviewed: Acceptable for surgery. (all labs ordered are listed, but only abnormal results are displayed)  Labs Reviewed  URINE CULTURE  CBC  COMPREHENSIVE METABOLIC PANEL     IMAGES:   EKG: 04/09/20 Rate 73 bpm  Sinus rhythm with premature supraventricular complexes Possible left atrial enlargement Septal infarct, age undetermined  CV: Echo 11/04/2019 1. Left ventricular ejection fraction, by estimation, is 60 to 65%. The  left ventricle has normal function. The left ventricle has no regional  wall motion abnormalities. There is moderate asymmetric left ventricular  hypertrophy of the basal-septal  segment. Left ventricular diastolic parameters are consistent with Grade I  diastolic dysfunction (impaired relaxation). Elevated left atrial  pressure.   2. Right ventricular systolic function is normal. The right ventricular  size is normal.   3. The mitral valve is normal  in structure. Mild mitral valve  regurgitation. No evidence of mitral stenosis.   4. The aortic valve is normal in structure. There is mild calcification  of the aortic valve. There is mild thickening of the aortic valve. Aortic  valve regurgitation is mild. Mild to moderate aortic valve  sclerosis/calcification is present, without any  evidence of aortic stenosis.   5. The inferior vena cava is normal in size with greater than 50%  respiratory variability, suggesting right atrial pressure of 3 mmHg.  Past Medical History:  Diagnosis Date   Anxiety    Bilateral bunions    CAD in native artery    a. NSTEMI 2/14 => LHC 03/08/12: Mid LAD 95% (hazy-suggestive of a ruptured plaque), EF 40%, distal anterior, apical and distal inferior HK =>  PCI: Xience DES to the mid LAD.   Cancer ([place])    skin cancer   Cardiomyopathy ([place])    LVEF 45% on cath/echo 03/2012, suspected to be ischemic vs stress-induced   Depression    Diverticulosis    Fracture 2013   Right foot fracture- did not require surgery   Hyperlipidemia    Hypertension    Macular degeneration    Mild mitral regurgitation    a. Echo 03/09/12: Mild focal basal hypertrophy the septum, EF 45-50%, distal anteroseptal, anterolateral, inferolateral, inferoseptal and apical HK, grade 2 diastolic dysfunction, mild MR, PASP 52   Myocardial infarction ([place])    Osteoporosis  Right knee meniscal tear 07/2016   Self-catheterizes urinary bladder    UTI (lower urinary tract infection)     Past Surgical History:  Procedure Laterality Date   ABDOMINAL HYSTERECTOMY  1980   BACK SURGERY  1986   CARPAL TUNNEL RELEASE Right 05/2013   CATARACT EXTRACTION W/PHACO Left 12/15/2014   Dr. Kathrin Penner   CATARACT EXTRACTION W/PHACO Right 02/14/2015   Dr. Kathrin Penner   CORONARY ANGIOPLASTY WITH STENT PLACEMENT  03/11/2012   95% mid LAD stenosis s/p DES, minimal nonobs dz elsewhere; LVEF 40%, moderate hypokinesis of distal anterior, apical and distal  inferior walls, mildly elevated filling pressures, mild pulmonary HTN   LEFT HEART CATHETERIZATION WITH CORONARY ANGIOGRAM N/A 03/11/2012   Procedure: LEFT HEART CATHETERIZATION WITH CORONARY ANGIOGRAM;  Surgeon: Wellington Hampshire, MD;  Location: [facility] CATH LAB;  Service: Cardiovascular;  Laterality: N/A;   reclast  2013   SKIN CANCER EXCISION      MEDICATIONS:  ALPRAZolam (XANAX) 0.25 MG tablet   Ascorbic Acid (VITAMIN C) 1000 MG tablet   aspirin 81 MG tablet   atorvastatin (LIPITOR) 80 MG tablet   cholecalciferol (VITAMIN D) 1000 UNITS tablet   denosumab (PROLIA) 60 MG/ML SOSY injection   HYDROcodone-acetaminophen (NORCO/VICODIN) 5-325 MG tablet   lisinopril (ZESTRIL) 5 MG tablet   Multiple Vitamins-Minerals (PRESERVISION AREDS 2 PO)   nitrofurantoin (MACRODANTIN) 50 MG capsule   nitroGLYCERIN (NITROSTAT) 0.4 MG SL tablet   Probiotic Product (ALIGN) 4 MG CAPS   SHINGRIX injection   No current facility-administered medications for this encounter.    Konrad Felix Ward, PA-C [facility] [phone]

## 2021-02-25 NOTE — Patient Instructions (Signed)
Medication Instructions:  ?Your physician recommends that you continue on your current medications as directed. Please refer to the Current Medication list given to you today.  ? ?Labwork: ?NONE ? ?Testing/Procedures: ?NONE ? ?Follow-Up: ?AS NEEDED  ? ?  ?

## 2021-02-28 DIAGNOSIS — R339 Retention of urine, unspecified: Secondary | ICD-10-CM | POA: Diagnosis not present

## 2021-03-02 ENCOUNTER — Other Ambulatory Visit: Payer: Self-pay

## 2021-03-02 ENCOUNTER — Encounter (HOSPITAL_COMMUNITY)
Admission: RE | Admit: 2021-03-02 | Discharge: 2021-03-02 | Disposition: A | Discharge status: Home / Self Care | Payer: PPO | Source: Ambulatory Visit | Attending: Urology | Admitting: Urology

## 2021-03-02 DIAGNOSIS — Z01818 Encounter for other preprocedural examination: Secondary | ICD-10-CM

## 2021-03-02 DIAGNOSIS — Z20822 Contact with and (suspected) exposure to covid-19: Secondary | ICD-10-CM | POA: Diagnosis not present

## 2021-03-02 DIAGNOSIS — Z01812 Encounter for preprocedural laboratory examination: Secondary | ICD-10-CM | POA: Diagnosis not present

## 2021-03-02 LAB — SARS CORONAVIRUS 2 (TAT 6-24 HRS): SARS Coronavirus 2: NEGATIVE

## 2021-03-04 ENCOUNTER — Encounter (HOSPITAL_COMMUNITY)
Admission: RE | Disposition: A | Discharge status: Home / Self Care | Payer: Self-pay | Source: Home / Self Care | Attending: Urology

## 2021-03-04 ENCOUNTER — Ambulatory Visit (HOSPITAL_COMMUNITY): Payer: PPO | Admitting: Physician Assistant

## 2021-03-04 ENCOUNTER — Encounter (HOSPITAL_COMMUNITY): Payer: Self-pay | Admitting: Urology

## 2021-03-04 ENCOUNTER — Other Ambulatory Visit: Payer: Self-pay

## 2021-03-04 ENCOUNTER — Ambulatory Visit (HOSPITAL_COMMUNITY): Payer: PPO | Admitting: Anesthesiology

## 2021-03-04 ENCOUNTER — Observation Stay (HOSPITAL_COMMUNITY)
Admission: RE | Admit: 2021-03-04 | Discharge: 2021-03-05 | Disposition: A | Discharge status: Home / Self Care | Payer: PPO | Attending: Urology | Admitting: Urology

## 2021-03-04 DIAGNOSIS — N312 Flaccid neuropathic bladder, not elsewhere classified: Secondary | ICD-10-CM | POA: Diagnosis not present

## 2021-03-04 DIAGNOSIS — N8189 Other female genital prolapse: Secondary | ICD-10-CM | POA: Diagnosis not present

## 2021-03-04 DIAGNOSIS — Z79899 Other long term (current) drug therapy: Secondary | ICD-10-CM | POA: Insufficient documentation

## 2021-03-04 DIAGNOSIS — Z7982 Long term (current) use of aspirin: Secondary | ICD-10-CM | POA: Insufficient documentation

## 2021-03-04 DIAGNOSIS — N8110 Cystocele, unspecified: Secondary | ICD-10-CM | POA: Diagnosis present

## 2021-03-04 DIAGNOSIS — N814 Uterovaginal prolapse, unspecified: Secondary | ICD-10-CM | POA: Diagnosis not present

## 2021-03-04 DIAGNOSIS — N8111 Cystocele, midline: Principal | ICD-10-CM | POA: Insufficient documentation

## 2021-03-04 DIAGNOSIS — Z01818 Encounter for other preprocedural examination: Secondary | ICD-10-CM

## 2021-03-04 HISTORY — PX: ROBOTIC ASSISTED LAPAROSCOPIC SACROCOLPOPEXY: SHX5388

## 2021-03-04 LAB — BASIC METABOLIC PANEL
Anion gap: 11 (ref 5–15)
BUN: 11 mg/dL (ref 8–23)
CO2: 22 mmol/L (ref 22–32)
Calcium: 8.9 mg/dL (ref 8.9–10.3)
Chloride: 104 mmol/L (ref 98–111)
Creatinine, Ser: 0.84 mg/dL (ref 0.44–1.00)
GFR, Estimated: >60 mL/min (ref >60)
Glucose, Bld: 162 mg/dL — ABNORMAL HIGH (ref 70–99)
Potassium: 3.9 mmol/L (ref 3.5–5.1)
Sodium: 137 mmol/L (ref 135–145)

## 2021-03-04 LAB — CBC
HCT: 41.6 % (ref 36.0–46.0)
Hemoglobin: 13.5 g/dL (ref 12.0–15.0)
MCH: 32.1 pg (ref 26.0–34.0)
MCHC: 32.5 g/dL (ref 30.0–36.0)
MCV: 99 fL (ref 80.0–100.0)
Platelets: 151 10*3/uL (ref 150–400)
RBC: 4.2 MIL/uL (ref 3.87–5.11)
RDW: 12.8 % (ref 11.5–15.5)
WBC: 12.1 10*3/uL — ABNORMAL HIGH (ref 4.0–10.5)
nRBC: 0 % (ref 0.0–0.2)

## 2021-03-04 LAB — ABO/RH: ABO/RH(D): O POS

## 2021-03-04 LAB — TYPE AND SCREEN
ABO/RH(D): O POS
Antibody Screen: NEGATIVE

## 2021-03-04 SURGERY — SACROCOLPOPEXY, ROBOT-ASSISTED, LAPAROSCOPIC
Anesthesia: General | Site: Abdomen

## 2021-03-04 MED ORDER — FENTANYL CITRATE (PF) 100 MCG/2ML IJ SOLN
INTRAMUSCULAR | Status: DC | PRN
  Administered 2021-03-04 (×5): 25 ug via INTRAVENOUS
  Administered 2021-03-04: 50 ug via INTRAVENOUS
  Administered 2021-03-04: 25 ug via INTRAVENOUS

## 2021-03-04 MED ORDER — TRAMADOL HCL 50 MG PO TABS: 50.0000 mg | ORAL_TABLET | Freq: Four times a day (QID) | ORAL | Status: DC | PRN

## 2021-03-04 MED ORDER — CHLORHEXIDINE GLUCONATE CLOTH 2 % EX PADS
6.0000 | MEDICATED_PAD | Freq: Every day | CUTANEOUS | Status: DC
  Administered 2021-03-04 – 2021-03-05 (×2): 6 via TOPICAL

## 2021-03-04 MED ORDER — BUPIVACAINE-EPINEPHRINE (PF) 0.5% -1:200000 IJ SOLN
INTRAMUSCULAR | Status: AC
  Filled 2021-03-04: qty 30

## 2021-03-04 MED ORDER — ESTRADIOL 0.1 MG/GM VA CREA
TOPICAL_CREAM | VAGINAL | Status: DC | PRN
  Administered 2021-03-04: 1 via VAGINAL

## 2021-03-04 MED ORDER — BUPIVACAINE LIPOSOME 1.3 % IJ SUSP
INTRAMUSCULAR | Status: AC
  Filled 2021-03-04: qty 20

## 2021-03-04 MED ORDER — ESTRADIOL 0.1 MG/GM VA CREA: 1.0000 | TOPICAL_CREAM | VAGINAL | 1 refills | Status: DC

## 2021-03-04 MED ORDER — SODIUM CHLORIDE 0.9 % IV SOLN: INTRAVENOUS | Status: DC

## 2021-03-04 MED ORDER — CEFAZOLIN SODIUM-DEXTROSE 2-4 GM/100ML-% IV SOLN
INTRAVENOUS | Status: AC
  Filled 2021-03-04: qty 100

## 2021-03-04 MED ORDER — ROCURONIUM BROMIDE 10 MG/ML (PF) SYRINGE
PREFILLED_SYRINGE | INTRAVENOUS | Status: AC
  Filled 2021-03-04: qty 10

## 2021-03-04 MED ORDER — HYDROMORPHONE HCL 1 MG/ML IJ SOLN: 0.5000 mg | INTRAMUSCULAR | Status: DC | PRN

## 2021-03-04 MED ORDER — ONDANSETRON HCL 4 MG/2ML IJ SOLN: 4.0000 mg | INTRAMUSCULAR | Status: DC | PRN

## 2021-03-04 MED ORDER — DOCUSATE SODIUM 100 MG PO CAPS: 100.0000 mg | ORAL_CAPSULE | Freq: Two times a day (BID) | ORAL | Status: DC

## 2021-03-04 MED ORDER — ROCURONIUM BROMIDE 10 MG/ML (PF) SYRINGE
PREFILLED_SYRINGE | INTRAVENOUS | Status: DC | PRN
  Administered 2021-03-04: 100 mg via INTRAVENOUS

## 2021-03-04 MED ORDER — SUGAMMADEX SODIUM 200 MG/2ML IV SOLN
INTRAVENOUS | Status: DC | PRN
  Administered 2021-03-04: 200 mg via INTRAVENOUS

## 2021-03-04 MED ORDER — FENTANYL CITRATE PF 50 MCG/ML IJ SOSY
25.0000 ug | PREFILLED_SYRINGE | INTRAMUSCULAR | Status: DC | PRN
  Administered 2021-03-04: 50 ug via INTRAVENOUS

## 2021-03-04 MED ORDER — ACETAMINOPHEN 500 MG PO TABS
ORAL_TABLET | ORAL | Status: AC
  Administered 2021-03-04: 1000 mg via ORAL
  Filled 2021-03-04: qty 2

## 2021-03-04 MED ORDER — FENTANYL CITRATE (PF) 100 MCG/2ML IJ SOLN
INTRAMUSCULAR | Status: AC
  Filled 2021-03-04: qty 2

## 2021-03-04 MED ORDER — PROPOFOL 10 MG/ML IV BOLUS
INTRAVENOUS | Status: DC | PRN
  Administered 2021-03-04: 100 mg via INTRAVENOUS

## 2021-03-04 MED ORDER — CEFAZOLIN SODIUM-DEXTROSE 1-4 GM/50ML-% IV SOLN
1.0000 g | Freq: Three times a day (TID) | INTRAVENOUS | Status: DC
  Administered 2021-03-04 – 2021-03-05 (×4): 1 g via INTRAVENOUS
  Filled 2021-03-04 (×5): qty 50

## 2021-03-04 MED ORDER — LACTATED RINGERS IV SOLN: INTRAVENOUS | Status: DC | PRN

## 2021-03-04 MED ORDER — ONDANSETRON HCL 4 MG/2ML IJ SOLN
INTRAMUSCULAR | Status: DC | PRN
  Administered 2021-03-04: 4 mg via INTRAVENOUS

## 2021-03-04 MED ORDER — STERILE WATER FOR IRRIGATION IR SOLN
Status: DC | PRN
  Administered 2021-03-04: 1000 mL

## 2021-03-04 MED ORDER — DOCUSATE SODIUM 100 MG PO CAPS
100.0000 mg | ORAL_CAPSULE | Freq: Two times a day (BID) | ORAL | Status: DC
  Administered 2021-03-04 – 2021-03-05 (×2): 100 mg via ORAL
  Filled 2021-03-04 (×2): qty 1

## 2021-03-04 MED ORDER — PROPOFOL 10 MG/ML IV BOLUS
INTRAVENOUS | Status: AC
  Filled 2021-03-04: qty 20

## 2021-03-04 MED ORDER — LISINOPRIL 5 MG PO TABS
5.0000 mg | ORAL_TABLET | Freq: Every day | ORAL | Status: DC
  Administered 2021-03-05: 5 mg via ORAL
  Filled 2021-03-04: qty 1

## 2021-03-04 MED ORDER — FENTANYL CITRATE PF 50 MCG/ML IJ SOSY
PREFILLED_SYRINGE | INTRAMUSCULAR | Status: AC
  Filled 2021-03-04: qty 2

## 2021-03-04 MED ORDER — TRAMADOL HCL 50 MG PO TABS: 50.0000 mg | ORAL_TABLET | Freq: Four times a day (QID) | ORAL | 0 refills | Status: DC | PRN

## 2021-03-04 MED ORDER — ONDANSETRON HCL 4 MG/2ML IJ SOLN: 4.0000 mg | Freq: Once | INTRAMUSCULAR | Status: DC | PRN

## 2021-03-04 MED ORDER — PHENYLEPHRINE HCL-NACL 20-0.9 MG/250ML-% IV SOLN
INTRAVENOUS | Status: AC
  Filled 2021-03-04: qty 250

## 2021-03-04 MED ORDER — TRAMADOL HCL 50 MG PO TABS: 100.0000 mg | ORAL_TABLET | Freq: Four times a day (QID) | ORAL | Status: DC | PRN

## 2021-03-04 MED ORDER — DEXAMETHASONE SODIUM PHOSPHATE 10 MG/ML IJ SOLN
INTRAMUSCULAR | Status: DC | PRN
  Administered 2021-03-04: 10 mg via INTRAVENOUS

## 2021-03-04 MED ORDER — KETOROLAC TROMETHAMINE 15 MG/ML IJ SOLN
15.0000 mg | Freq: Four times a day (QID) | INTRAMUSCULAR | Status: DC
  Administered 2021-03-04 – 2021-03-05 (×4): 15 mg via INTRAVENOUS
  Filled 2021-03-04 (×4): qty 1

## 2021-03-04 MED ORDER — LIDOCAINE HCL (CARDIAC) PF 100 MG/5ML IV SOSY
PREFILLED_SYRINGE | INTRAVENOUS | Status: DC | PRN
Start: 2021-03-04 — End: 2021-03-04
  Administered 2021-03-04: 60 mg via INTRAVENOUS

## 2021-03-04 MED ORDER — CHLORHEXIDINE GLUCONATE 0.12 % MT SOLN: 15.0000 mL | Freq: Once | OROMUCOSAL | Status: AC

## 2021-03-04 MED ORDER — NITROGLYCERIN 0.4 MG SL SUBL: 0.4000 mg | SUBLINGUAL_TABLET | SUBLINGUAL | Status: DC | PRN

## 2021-03-04 MED ORDER — KETOROLAC TROMETHAMINE 15 MG/ML IJ SOLN
INTRAMUSCULAR | Status: AC
  Administered 2021-03-04: 15 mg via INTRAVENOUS
  Filled 2021-03-04: qty 1

## 2021-03-04 MED ORDER — POLYETHYLENE GLYCOL 3350 17 GM/SCOOP PO POWD: 1.0000 | Freq: Once | ORAL | Status: DC

## 2021-03-04 MED ORDER — CEPHALEXIN 500 MG PO CAPS: 500.0000 mg | ORAL_CAPSULE | Freq: Two times a day (BID) | ORAL | 0 refills | Status: DC

## 2021-03-04 MED ORDER — ACETAMINOPHEN 500 MG PO TABS: 1000.0000 mg | ORAL_TABLET | Freq: Once | ORAL | Status: AC

## 2021-03-04 MED ORDER — PHENYLEPHRINE HCL-NACL 20-0.9 MG/250ML-% IV SOLN
INTRAVENOUS | Status: DC | PRN
  Administered 2021-03-04: 30 ug/min via INTRAVENOUS

## 2021-03-04 MED ORDER — LACTATED RINGERS IV SOLN: INTRAVENOUS | Status: DC

## 2021-03-04 MED ORDER — LACTATED RINGERS IR SOLN
Status: DC | PRN
  Administered 2021-03-04: 1000 mL

## 2021-03-04 MED ORDER — BUPIVACAINE-EPINEPHRINE 0.5% -1:200000 IJ SOLN
INTRAMUSCULAR | Status: DC | PRN
  Administered 2021-03-04 (×2): 10 mL

## 2021-03-04 MED ORDER — ESTRADIOL 0.1 MG/GM VA CREA
TOPICAL_CREAM | VAGINAL | Status: AC
  Filled 2021-03-04: qty 42.5

## 2021-03-04 MED ORDER — ALPRAZOLAM 0.25 MG PO TABS: 0.2500 mg | ORAL_TABLET | Freq: Every evening | ORAL | Status: DC | PRN

## 2021-03-04 MED ORDER — ORAL CARE MOUTH RINSE
15.0000 mL | Freq: Once | OROMUCOSAL | Status: AC
  Administered 2021-03-04: 15 mL via OROMUCOSAL

## 2021-03-04 MED ORDER — ACETAMINOPHEN 10 MG/ML IV SOLN
1000.0000 mg | Freq: Four times a day (QID) | INTRAVENOUS | Status: AC
  Administered 2021-03-04 – 2021-03-05 (×4): 1000 mg via INTRAVENOUS
  Filled 2021-03-04 (×4): qty 100

## 2021-03-04 MED ORDER — PHENYLEPHRINE HCL (PRESSORS) 10 MG/ML IV SOLN
INTRAVENOUS | Status: DC | PRN
  Administered 2021-03-04: 80 ug via INTRAVENOUS

## 2021-03-04 MED ORDER — CEFAZOLIN SODIUM-DEXTROSE 2-4 GM/100ML-% IV SOLN
2.0000 g | INTRAVENOUS | Status: AC
  Administered 2021-03-04: 2 g via INTRAVENOUS

## 2021-03-04 MED ORDER — BUPIVACAINE LIPOSOME 1.3 % IJ SUSP
INTRAMUSCULAR | Status: DC | PRN
  Administered 2021-03-04: 20 mL

## 2021-03-04 SURGICAL SUPPLY — 70 items
BAG COUNTER SPONGE SURGICOUNT (BAG) IMPLANT
BAG URINE DRAIN 2000ML AR STRL (UROLOGICAL SUPPLIES) IMPLANT
CATH FOLEY 2WAY SLVR  5CC 16FR (CATHETERS) ×2
CATH FOLEY 2WAY SLVR 5CC 16FR (CATHETERS) ×1 IMPLANT
CHLORAPREP W/TINT 26 (MISCELLANEOUS) ×2 IMPLANT
CLIP LIGATING HEM O LOK PURPLE (MISCELLANEOUS) ×2 IMPLANT
CLIP LIGATING HEMO LOK XL GOLD (MISCELLANEOUS) IMPLANT
CLIP LIGATING HEMO O LOK GREEN (MISCELLANEOUS) IMPLANT
COVER BACK TABLE 60X90IN (DRAPES) ×2 IMPLANT
COVER SURGICAL LIGHT HANDLE (MISCELLANEOUS) ×3 IMPLANT
COVER TIP SHEARS 8 DVNC (MISCELLANEOUS) ×1 IMPLANT
COVER TIP SHEARS 8MM DA VINCI (MISCELLANEOUS) ×2
DERMABOND ADVANCED (GAUZE/BANDAGES/DRESSINGS) ×1
DERMABOND ADVANCED .7 DNX12 (GAUZE/BANDAGES/DRESSINGS) ×1 IMPLANT
DRAIN CHANNEL RND F F (WOUND CARE) IMPLANT
DRAPE ARM DVNC X/XI (DISPOSABLE) ×4 IMPLANT
DRAPE COLUMN DVNC XI (DISPOSABLE) ×1 IMPLANT
DRAPE DA VINCI XI ARM (DISPOSABLE) ×8
DRAPE DA VINCI XI COLUMN (DISPOSABLE) ×2
DRAPE INCISE IOBAN 66X45 STRL (DRAPES) ×2 IMPLANT
DRAPE SHEET LG 3/4 BI-LAMINATE (DRAPES) ×4 IMPLANT
DRAPE SURG IRRIG POUCH 19X23 (DRAPES) ×2 IMPLANT
ELECT PENCIL ROCKER SW 15FT (MISCELLANEOUS) ×2 IMPLANT
ELECT REM PT RETURN 15FT ADLT (MISCELLANEOUS) ×2 IMPLANT
GAUZE 4X4 16PLY ~~LOC~~+RFID DBL (SPONGE) IMPLANT
GLOVE SURG ENC MOIS LTX SZ6.5 (GLOVE) ×2 IMPLANT
GLOVE SURG ENC TEXT LTX SZ7.5 (GLOVE) ×6 IMPLANT
GOWN STRL REUS W/TWL LRG LVL3 (GOWN DISPOSABLE) ×2 IMPLANT
GOWN STRL REUS W/TWL XL LVL3 (GOWN DISPOSABLE) ×4 IMPLANT
HOLDER FOLEY CATH W/STRAP (MISCELLANEOUS) ×2 IMPLANT
IRRIG SUCT STRYKERFLOW 2 WTIP (MISCELLANEOUS) ×2
IRRIGATION SUCT STRKRFLW 2 WTP (MISCELLANEOUS) ×1 IMPLANT
KIT BASIN OR (CUSTOM PROCEDURE TRAY) ×2 IMPLANT
KIT TURNOVER KIT A (KITS) IMPLANT
MANIPULATOR UTERINE 4.5 ZUMI (MISCELLANEOUS) IMPLANT
MARKER SKIN DUAL TIP RULER LAB (MISCELLANEOUS) ×2 IMPLANT
MESH Y UPSYLON VAGINAL (Mesh General) ×1 IMPLANT
OCCLUDER COLPOPNEUMO (BALLOONS) IMPLANT
PACKING VAGINAL (PACKING) ×1 IMPLANT
PAD OB MATERNITY 4.3X12.25 (PERSONAL CARE ITEMS) IMPLANT
PAD POSITIONING PINK XL (MISCELLANEOUS) ×2 IMPLANT
PANTS MESH DISP LRG (UNDERPADS AND DIAPERS) IMPLANT
PANTS MESH DISPOSABLE L (UNDERPADS AND DIAPERS)
POUCH SPECIMEN RETRIEVAL 10MM (ENDOMECHANICALS) IMPLANT
SCISSORS LAP 5X45 EPIX DISP (ENDOMECHANICALS) ×1 IMPLANT
SEAL CANN UNIV 5-8 DVNC XI (MISCELLANEOUS) ×4 IMPLANT
SEAL XI 5MM-8MM UNIVERSAL (MISCELLANEOUS) ×8
SET IRRIG Y TYPE TUR BLADDER L (SET/KITS/TRAYS/PACK) IMPLANT
SET TUBE SMOKE EVAC HIGH FLOW (TUBING) ×2 IMPLANT
SHEET LAVH (DRAPES) ×2 IMPLANT
SOL PREP POV-IOD 4OZ 10% (MISCELLANEOUS) IMPLANT
SOLUTION ELECTROLUBE (MISCELLANEOUS) ×2 IMPLANT
SURGILUBE 2OZ TUBE FLIPTOP (MISCELLANEOUS) ×1 IMPLANT
SUT MNCRL AB 4-0 PS2 18 (SUTURE) ×4 IMPLANT
SUT PROLENE 2 0 CT 1 (SUTURE) ×2 IMPLANT
SUT VIC AB 0 CT1 27 (SUTURE) ×4
SUT VIC AB 0 CT1 27XBRD ANTBC (SUTURE) ×1 IMPLANT
SUT VIC AB 2-0 SH 27 (SUTURE) ×10
SUT VIC AB 2-0 SH 27XBRD (SUTURE) ×5 IMPLANT
SUT VIC AB 3-0 SH 27 (SUTURE) ×10
SUT VIC AB 3-0 SH 27X BRD (SUTURE) ×1 IMPLANT
SUT VICRYL 0 UR6 27IN ABS (SUTURE) ×2 IMPLANT
SYR 50ML LL SCALE MARK (SYRINGE) IMPLANT
SYR BULB IRRIG 60ML STRL (SYRINGE) IMPLANT
TOWEL OR 17X26 10 PK STRL BLUE (TOWEL DISPOSABLE) ×2 IMPLANT
TRAY LAPAROSCOPIC (CUSTOM PROCEDURE TRAY) ×2 IMPLANT
TROCAR ENDOPATH XCEL 12X100 BL (ENDOMECHANICALS) IMPLANT
TROCAR XCEL 12X100 BLDLESS (ENDOMECHANICALS) ×2 IMPLANT
TROCAR XCEL NON-BLD 5MMX100MML (ENDOMECHANICALS) ×2 IMPLANT
WATER STERILE IRR 1000ML POUR (IV SOLUTION) ×2 IMPLANT

## 2021-03-04 NOTE — Op Note (Signed)
Preoperative diagnosis:  Pelvic organ prolapse   Postoperative diagnosis:  same   Procedure: Robotic assisted laparoscopic sacrocolpopexy  Surgeon: [name], MD 1st assistant: Debbrah Alar, PA-C Resident surgeon: Darl Pikes, MD  Anesthesia: General  Complications: None  Intraoperative findings: [employer] scientific Upsilon Y-Mesh placed, cut to specific vaginal length  EBL: 100cc  Specimens: None  Indication: HEIDE BROSSART is a 88 y.o. female patient with symptomatic pelvic organ prolapse.  After reviewing the management options for treatment, he elected to proceed with the above surgical procedure(s). We have discussed the potential benefits and risks of the procedure, side effects of the proposed treatment, the likelihood of the patient achieving the goals of the procedure, and any potential problems that might occur during the procedure or recuperation. Informed consent has been obtained.  Description of procedure:  A Foley catheter was then placed and placed to gravity drainage. I then made a periumbilical incision carrying the dissection down to the patient's fascia with electrocautery.  Once to the fascia, the fascia was incised the peritoneum opened.  A 11mm port was then placed into the abdomen.  The abdomen was insufflated and the remaining ports placed under digital guidance.  2 ports were placed lateral to the umbilicus on the right proximally 10 cm apart.  The most lateral port was approximately 3 cm above the anterior iliac spine.  2 additional ports were placed in the patient's right side in comparable positions to the most lateral port on the right was a 12 mm port.the robot was then docked at an angle from the leg obliquely along the side of the left leg.  We then began our surgery by cleaning up some of the pelvic adhesions to the small bowel and colon.  Once this was completed I started dissecting at the sacral promontory located 3 cm medial to the location  where the ureter crosses over the iliac vessels at the pelvic brim. The posterior peritoneum was incised and the sacral prominence cleared off an area taking care to avoid the middle sacral vessels and the iliac branches.  I then created a posterior peritoneal tunnel starting at the sacral promontory and tunneling down the right pelvic sidewall down into the pelvis breaking back through the posterior peritoneum around the vesico-vaginal junction posteriorly.  I then continued the posterior dissection retracting down on the rectum and finding the avascular plane between the posterior vaginal wall and the rectum.  I carried this dissection down as far as I could to along the area of the perineal body.  I then turned my attention to the anterior plane between the anterior vaginal wall and the bladder.  I was able to obtain access to the avascular plane and with a combination of both monopolar cautery and blunt dissection was able to clean and nice down to the bladder neck.  I then turned my attention back to the patient's uterus and skeletonized the right uterine artery and vein and then took this with a series of bipolar moves.  I then performed a similar uterus pedicle ligation on the left.    Mesh was measured at approximately 7.5 cm anteriorly and 7.5 cm posteriorly and I cut this on the back table.  The mesh was then placed into the patient's abdomen through the assistant port and the anterior leaf was secured down onto the anterior vaginal wall with the apex at the bladder neck.  The posterior leaf was then secured down on the posterior vaginal wall.  These were  sewn down with 2-0 Vicryl.  Between 6 and 8 were done on each side.  At this point I then went back to the previously dissected sacral promontory and posterior peritoneal tunnel and inserted a instrument through the tunnel and grasped the end of the mesh at the vaginal cuff and pull it up to the sacrum.  I then checked to ensure that the sacral mesh was  not too tight by performing a vaginal exam.  I then secured the sacral leg of the mesh using a 0 Prolene.  I then reapproximated the posterior peritoneum with a 2-0 Vicryl in a running fashion around the sacral promontory.  The pelvic peritoneum was closed using a pursestring.   The fascia of the 12 mm port was then closed with 0 Vicryl in a figure-of-eight fashion.  The skin was closed with 4-0 Monocryl's.  Dermabond was applied to the incision and exparel injected into the incisions.  Estrace impregnated packing was then placed into her vagina which will be left in overnight.  The patient was subsequently extubated and returned to the PACU in excellent condition.   [name], M.D.

## 2021-03-04 NOTE — Anesthesia Postprocedure Evaluation (Signed)
Anesthesia Post Note  Patient: Erin Roth  Procedure(s) Performed: XI ROBOTIC ASSISTED LAPAROSCOPIC SACROCOLPOPEXY (Abdomen)     Patient location during evaluation: PACU Anesthesia Type: General Level of consciousness: awake and alert, oriented and patient cooperative Pain management: pain level controlled Vital Signs Assessment: post-procedure vital signs reviewed and stable Respiratory status: spontaneous breathing, nonlabored ventilation and respiratory function stable Cardiovascular status: blood pressure returned to baseline and stable Postop Assessment: no apparent nausea or vomiting Anesthetic complications: no   No notable events documented.  Last Vitals:  Vitals:   03/04/21 1352 03/04/21 1418  BP: 125/74 123/77  Pulse: 80 77  Resp: 16 18  Temp: 36.9 C 36.7 C  SpO2: 99% 92%    Last Pain:  Vitals:   03/04/21 1418  TempSrc: Axillary  PainSc:                  Pervis Hocking

## 2021-03-04 NOTE — H&P (Signed)
88 year old female with a history of cystocele, recurring urinary tract infections, and neurogenic bladder. I last saw the patient in August 2020. That time she had done great, had not been on antibiotics an 18 months, and was cathing 3 times daily. Since the patient was last seen she has had to bacterial infections requiring antibiotics. The last 1, couple months ago, was quite severe according to the patient. She had been on nitrofurantoin 100 mg every other day for urinary tract prevention were suppression.   The patient catheterizes 3 times daily. She voids on her own about 3 times daily as well. She does not get up at night to void. She catheterization his after breakfast, after lunch, and before bed.   The patient and I have discussed management for her cystocele. She had a bad experience with a pessary several years ago. I referred her to see Dr. Helane Rima to see if they might be able to fit her for a pessary the with stay. The patient would like to do this, but has been nervous based on her prior experience with a pessary.   August 2022: Today patient follows up, 1 year from her last office visit. She reports being treated for 5 infections over the past 6-8 months. She continues to cath 3 times daily. She has had a difficult time with the pessary and prolapse management. She is returning on Wednesday for further pessary fittings. Currently she is not complaining of any back pain bladder pain. She denies any significant urgency   12/22: Today the patient is seen in follow-up. She was started on nitrofurantoin 50 mg nightly for her recurrent urinary tract infections and she denies any trouble with this. However, she did end up in the emergency room with left sided acute onset flank pain. The pain radiated around to her groin. She had a CT scan at that time in the emergency department which demonstrated no evidence of stone or clear etiology of obstruction. She did have some hydronephrosis as well as a  distended bladder. Her pain did quickly resolved.   The patient continues to cath 4 times daily. The patient has not tolerated any of the pessaries and has given up on this, she is eager to proceed with surgical treatment.   UDS, 1/23: The patient had a fairly small bladder with normal sensation. There was no detrusor function, no voiding phase. There was no evidence of stress urinary incontinence or overactive bladder.   Intv: Today the patient follows up for preop discussion. She was cleared by her primary care doctor, has cardiac clearance pending. She is otherwise doing well. She is anxious to move forward with this operation. She has completed her urodynamic evaluation.     ALLERGIES: Bethanechol Chloride TABS - Other Reaction, unknown Macrobid CAPS - Other Reaction, unknown Sulfa Drugs - Skin Rash Trimethoprim TABS - Skin Rash Vagifem TABS - Other Reaction, breast tenderness    MEDICATIONS: Nitrofurantoin 50 mg capsule 1 capsule PO Q HS  Align  Alprazolam 0.25 mg tablet Oral  Ascorbic Acid CR 1000 MG TBCR Oral  Aspirin Ec 81 mg tablet, delayed release 0 Oral  Atorvastatin Calcium 80 mg tablet Oral  Cranberry  Fish Oil CAPS Oral  Icaps Mv 100 mcg-1.66 mg-0.83 mg tablet, delayed release Oral  Lisinopril 5 mg tablet Oral  Metoprolol Tartrate 25 mg tablet Oral  Nitroglycerin SUBL Sublingual  Trazodone Hcl  Vitamin D3 25 mcg (1,000 unit) tablet Oral     GU PSH: Catheterization For Collection Of  Specimen, Single Patient, All Places Of Service - 2018 Catheterize For Residual - 2018 Complex cystometrogram, w/ void pressure and urethral pressure profile studies, any technique - 02/02/2021 Complex Uroflow - 02/02/2021 Emg surf Electrd - 02/02/2021 Hysterectomy Unilat SO - 2007, 2007 Inject For cystogram - 02/02/2021 Intrabd voidng Press - 02/02/2021     NON-GU PSH: Back Surgery (Unspecified) Cardiac Stent Placement Diagnostic Colonoscopy - 2007     GU PMH: Incomplete bladder  emptying - 02/02/2021, - 2018, - 2017 Areflexic bladder - 01/04/2021, - 09/27/2020, - 11/17/2019 (Stable), - 2018, - 2018 (Chronic), Will continue with TID CIC , - 2017, - 2017 Cystocele, midline - 01/04/2021, - 09/27/2020, - 11/17/2019, - 2018 Hydronephrosis - 01/04/2021 Chronic cystitis (w/o hematuria) - 09/27/2020, - 11/17/2019 (Stable), - 2018, - 2018, - 2017, Chronic cystitis, - 2016 Female genital prolapse, unspecified - 09/09/2018, - 2020, - 2019, - 2019 (Worsening), Prolapse, with first pessary use (3-1/4 inch flexible Gellhorn) not working, - 2019, Large, symptomatic cystocele. Treated today with pessary, - 2019, - 2018 Postmenopausal atrophic vaginitis - 2018, - 2017, Atrophic vaginitis, - 2014 Hydronephrosis Unspec (Chronic), Left, Mild/moderate but improved since last [place] 1 week ago F/U with RUS in 1 yr - 2017 Pyelonephritis - 2017 Acute Cystitis/UTI (Stable) - 2017, Acute cystitis without hematuria, - 2014 Dysuria, Dysuria - 2016 Urinary Tract Inf, Unspec site, Pyuria - 2016, Urinary tract infection, - 2016 Gross hematuria, Gross hematuria - 2015 Cystocele, Unspec, Vaginal wall prolapse - 2014 Pelvic/perineal pain, Female pelvic pain - 2014 Rectocele, Rectocele - 2014 Urinary Retention, Unspec, Incomplete bladder emptying - 2014 Dorsalgia, Unspec, Backache - 2014 Flank Pain, Generalized abdominal pain - 2014 Oth GU systems Signs/Symptoms, Bladder pain - 2014 Urethral caruncle, Urethral caruncle - 2014 Urinary Frequency, Increased urinary frequency - 2014 Urinary Urgency, Urinary urgency - 2014    NON-GU PMH: Encounter for general adult medical examination without abnormal findings, Encounter for preventive health examination - 2016 Exudative age-related macular degeneration, unspecified eye, stage unspecified, Macular degeneration, wet - 2014 Myocardial Infarction, History of acute myocardial infarction - 2014 Personal history of other diseases of the musculoskeletal system and  connective tissue, History of osteopenia - 2014 Disorder of muscle, unspecified, Disorders Of The Muscle, Ligament, And Fascia - 2014 Muscle wasting and atrophy, not elsewhere classified, multiple sites, Disuse Muscle Atrophy - 2014 Other symptoms and signs involving the nervous system, Other symptom or sign involving nervous system - 2014 Personal history of other diseases of the circulatory system, History of hypertension - 2014    FAMILY HISTORY: Cancer - Father Cardiac Failure - Mother Colon Cancer - Sister Family Health Status Number - Runs In Family   SOCIAL HISTORY: Marital Status: Married Preferred Language: English; Ethnicity: Not Hispanic Or Latino; Race: White Current Smoking Status: Patient has never smoked.  Has never drank.  Drinks 2 caffeinated drinks per day.    REVIEW OF SYSTEMS:    GU Review Female:   Patient denies frequent urination, hard to postpone urination, burning /pain with urination, get up at night to urinate, leakage of urine, stream starts and stops, trouble starting your stream, have to strain to urinate, and being pregnant.  Gastrointestinal (Upper):   Patient denies nausea, vomiting, and indigestion/ heartburn.  Gastrointestinal (Lower):   Patient denies diarrhea and constipation.  Constitutional:   Patient denies fever, night sweats, weight loss, and fatigue.  Skin:   Patient denies skin rash/ lesion and itching.  Eyes:   Patient denies blurred vision and double vision.  Ears/  Nose/ Throat:   Patient denies sore throat and sinus problems.  Hematologic/Lymphatic:   Patient denies swollen glands and easy bruising.  Cardiovascular:   Patient denies leg swelling and chest pains.  Respiratory:   Patient denies cough and shortness of breath.  Endocrine:   Patient denies excessive thirst.  Musculoskeletal:   Patient denies back pain and joint pain.  Neurological:   Patient denies headaches and dizziness.  Psychologic:   Patient denies depression and  anxiety.   VITAL SIGNS: None   GU PHYSICAL EXAMINATION:    External Genitalia: No hirsutism, no rash, no scarring, no cyst, no erythematous lesion, no papular lesion, no blanched lesion, no warty lesion. No edema.  Urethral Meatus: Normal size. Normal position. No discharge.  Urethra: No tenderness, no mass, no scarring. No hypermobility. No leakage.  Bladder: Normal to palpation, no tenderness, no mass, normal size.  Vagina: Mild vaginal atrophy. Small rectocele. Large cystocele. No stenosis. No enterocele.    MULTI-SYSTEM PHYSICAL EXAMINATION:    Constitutional: Well-nourished. No physical deformities. Normally developed. Good grooming.  Respiratory: Normal breath sounds. No labored breathing, no use of accessory muscles.   Cardiovascular: Regular rate and rhythm. No murmur, no gallop. Normal temperature, normal extremity pulses, no swelling, no varicosities.      Complexity of Data:  Source Of History:  Patient  Records Review:   Previous Doctor Records, Previous Patient Records, POC Tool  Urine Test Review:   Urinalysis   PROCEDURES:          Urinalysis - 81003 Dipstick Dipstick Cont'd  Color: Yellow Bilirubin: Neg  Appearance: Clear Ketones: Neg  Specific Gravity: 1.015 Blood: Neg  pH: 6.0 Protein: Neg  Glucose: Neg Urobilinogen: 0.2    Nitrites: Neg    Leukocyte Esterase: Neg    Notes:      ASSESSMENT:      ICD-10 Details  1 GU:   Areflexic bladder - N31.2   2   Cystocele, midline - N81.11    PLAN:           Document Letter(s):  Created for Patient: Clinical Summary         Notes:   I went over robotic-assisted laparoscopic sacral colpopexy with the patient detail. I explained to the patient the rationale for the surgery. I also went over the placement of the laparoscopic ports. I detailed to her the surgery as well as the postoperative recovery time. I explained to the patient that she could expect to be in the hospital at least one or 2 nights. She will  require 4 weeks of no heavy lifting, 6 weeks of no bending or twisting. She will not be able to use her vagina for 6 weeks. I discussed complications of the operation including injury to bowel, ureters, bladder. We also discussed the risk of failure as well as the complications of mesh. I explained to them the difference between transvaginal mesh and the mesh used for sacral colpopexy. I reassured them that there has not been an FDA warnings in regards to sacral colpopexy mesh. We will plan to get this prior to her surgery. I spent 45 minutes with the patient going over that ins and outs of the surgery and answering all her questions.   The patient is due for cardiac clearance next week. Her surgery scheduled for February 3.

## 2021-03-04 NOTE — Transfer of Care (Signed)
Immediate Anesthesia Transfer of Care Note  Patient: Erin Roth  Procedure(s) Performed: XI ROBOTIC ASSISTED LAPAROSCOPIC SACROCOLPOPEXY (Abdomen)  Patient Location: PACU  Anesthesia Type:General  Level of Consciousness: sedated  Airway & Oxygen Therapy: Patient Spontanous Breathing and Patient connected to face mask oxygen  Post-op Assessment: Report given to RN and Post -op Vital signs reviewed and stable  Post vital signs: Reviewed and stable  Last Vitals:  Vitals Value Taken Time  BP 132/90 03/04/21 1116  Temp    Pulse 98 03/04/21 1117  Resp 15 03/04/21 1117  SpO2 100 % 03/04/21 1117  Vitals shown include unvalidated device data.  Last Pain:  Vitals:   03/04/21 0548  TempSrc: Oral         Complications: No notable events documented.

## 2021-03-04 NOTE — Plan of Care (Signed)

## 2021-03-04 NOTE — Discharge Instructions (Signed)

## 2021-03-04 NOTE — Interval H&P Note (Signed)
History and Physical Interval Note:  03/04/2021 7:21 AM  Erin Roth  has presented today for surgery, with the diagnosis of CYSTOCELE.  The various methods of treatment have been discussed with the patient and family. After consideration of risks, benefits and other options for treatment, the patient has consented to  Procedure(s): XI ROBOTIC ASSISTED LAPAROSCOPIC SACROCOLPOPEXY (N/A) as a surgical intervention.  The patient's history has been reviewed, patient examined, no change in status, stable for surgery.  I have reviewed the patient's chart and labs.  Questions were answered to the patient's satisfaction.     [name]

## 2021-03-04 NOTE — Anesthesia Procedure Notes (Addendum)
Procedure Name: Intubation Date/Time: 03/04/2021 7:35 AM Performed by: Lind Covert, CRNA Pre-anesthesia Checklist: Patient identified, Emergency Drugs available, Suction available, Patient being monitored and Timeout performed Patient Re-evaluated:Patient Re-evaluated prior to induction Oxygen Delivery Method: Circle system utilized Preoxygenation: Pre-oxygenation with 100% oxygen Induction Type: IV induction Ventilation: Mask ventilation without difficulty Laryngoscope Size: Mac and 3 Grade View: Grade I Tube type: Oral Tube size: 6.5 mm Number of attempts: 1 Airway Equipment and Method: Stylet and Patient positioned with wedge pillow Placement Confirmation: ETT inserted through vocal cords under direct vision, positive ETCO2 and breath sounds checked- equal and bilateral Secured at: 22 cm Tube secured with: Tape Dental Injury: Teeth and Oropharynx as per pre-operative assessment

## 2021-03-04 NOTE — Plan of Care (Signed)

## 2021-03-04 NOTE — Progress Notes (Signed)
End of shift  Pt arrived on the floor this afternoon from PACU.  Pt had 2 out of 10 pain, oxygen in the 90s on room air, family present.    Dr Louis Meckel called and verbally said that pt can ambulate with staff, have ice packs for comfort and vaginal packing should be removed at 5am along with the foley.

## 2021-03-05 DIAGNOSIS — N8111 Cystocele, midline: Secondary | ICD-10-CM | POA: Diagnosis not present

## 2021-03-05 LAB — CBC
HCT: 35.3 % — ABNORMAL LOW (ref 36.0–46.0)
Hemoglobin: 11.5 g/dL — ABNORMAL LOW (ref 12.0–15.0)
MCH: 31.8 pg (ref 26.0–34.0)
MCHC: 32.6 g/dL (ref 30.0–36.0)
MCV: 97.5 fL (ref 80.0–100.0)
Platelets: 143 10*3/uL — ABNORMAL LOW (ref 150–400)
RBC: 3.62 MIL/uL — ABNORMAL LOW (ref 3.87–5.11)
RDW: 12.8 % (ref 11.5–15.5)
WBC: 9.9 10*3/uL (ref 4.0–10.5)
nRBC: 0 % (ref 0.0–0.2)

## 2021-03-05 LAB — BASIC METABOLIC PANEL
Anion gap: 6 (ref 5–15)
BUN: 14 mg/dL (ref 8–23)
CO2: 25 mmol/L (ref 22–32)
Calcium: 8.1 mg/dL — ABNORMAL LOW (ref 8.9–10.3)
Chloride: 100 mmol/L (ref 98–111)
Creatinine, Ser: 0.99 mg/dL (ref 0.44–1.00)
GFR, Estimated: 56 mL/min — ABNORMAL LOW (ref >60)
Glucose, Bld: 92 mg/dL (ref 70–99)
Potassium: 3.6 mmol/L (ref 3.5–5.1)
Sodium: 131 mmol/L — ABNORMAL LOW (ref 135–145)

## 2021-03-05 NOTE — Plan of Care (Signed)
°  Problem: Education: Goal: Knowledge of General Education information will improve Description: Including pain rating scale, medication(s)/side effects and non-pharmacologic comfort measures 03/05/2021 1611 by Freddy Finner, RN Outcome: Adequate for Discharge 03/05/2021 1549 by Freddy Finner, RN Outcome: Progressing   Problem: Health Behavior/Discharge Planning: Goal: Ability to manage health-related needs will improve 03/05/2021 1611 by Freddy Finner, RN Outcome: Adequate for Discharge 03/05/2021 1549 by Freddy Finner, RN Outcome: Progressing   Problem: Clinical Measurements: Goal: Ability to maintain clinical measurements within normal limits will improve 03/05/2021 1611 by Freddy Finner, RN Outcome: Adequate for Discharge 03/05/2021 1549 by Freddy Finner, RN Outcome: Progressing Goal: Will remain free from infection 03/05/2021 1611 by Freddy Finner, RN Outcome: Adequate for Discharge 03/05/2021 1549 by Freddy Finner, RN Outcome: Progressing   Problem: Activity: Goal: Risk for activity intolerance will decrease 03/05/2021 1611 by Freddy Finner, RN Outcome: Adequate for Discharge 03/05/2021 1549 by Freddy Finner, RN Outcome: Progressing   Problem: Nutrition: Goal: Adequate nutrition will be maintained 03/05/2021 1611 by Freddy Finner, RN Outcome: Adequate for Discharge 03/05/2021 1549 by Freddy Finner, RN Outcome: Progressing   Problem: Elimination: Goal: Will not experience complications related to bowel motility 03/05/2021 1611 by Freddy Finner, RN Outcome: Adequate for Discharge 03/05/2021 1549 by Freddy Finner, RN Outcome: Progressing Goal: Will not experience complications related to urinary retention 03/05/2021 1611 by Freddy Finner, RN Outcome: Adequate for Discharge 03/05/2021 1549 by Freddy Finner, RN Outcome: Progressing   Problem: Pain Managment: Goal: General experience of comfort will improve 03/05/2021 1611 by Freddy Finner,  RN Outcome: Adequate for Discharge 03/05/2021 1549 by Freddy Finner, RN Outcome: Progressing   Problem: Safety: Goal: Ability to remain free from injury will improve 03/05/2021 1611 by Freddy Finner, RN Outcome: Adequate for Discharge 03/05/2021 1549 by Freddy Finner, RN Outcome: Progressing   Problem: Skin Integrity: Goal: Risk for impaired skin integrity will decrease 03/05/2021 1611 by Freddy Finner, RN Outcome: Adequate for Discharge 03/05/2021 1549 by Freddy Finner, RN Outcome: Progressing

## 2021-03-05 NOTE — Plan of Care (Signed)
°  Problem: Education: Goal: Knowledge of General Education information will improve Description: Including pain rating scale, medication(s)/side effects and non-pharmacologic comfort measures Outcome: Progressing   Problem: Health Behavior/Discharge Planning: Goal: Ability to manage health-related needs will improve Outcome: Progressing   Problem: Clinical Measurements: Goal: Ability to maintain clinical measurements within normal limits will improve Outcome: Progressing Goal: Will remain free from infection Outcome: Progressing   Problem: Activity: Goal: Risk for activity intolerance will decrease Outcome: Progressing   Problem: Nutrition: Goal: Adequate nutrition will be maintained Outcome: Progressing   Problem: Elimination: Goal: Will not experience complications related to bowel motility Outcome: Progressing Goal: Will not experience complications related to urinary retention Outcome: Progressing   Problem: Pain Managment: Goal: General experience of comfort will improve Outcome: Progressing   Problem: Safety: Goal: Ability to remain free from injury will improve Outcome: Progressing   Problem: Skin Integrity: Goal: Risk for impaired skin integrity will decrease Outcome: Progressing

## 2021-03-05 NOTE — Discharge Summary (Signed)
Physician Discharge Summary  Patient ID: Erin Roth MRN: [id number] DOB/AGE: [birth year] 88 y.o.  Admit date: 03/04/2021 Discharge date: 03/05/2021  Admission Diagnoses: Cystocele with prolapse Discharge Diagnoses:  Principal Problem:   Cystocele with prolapse   Discharged Condition: good  Hospital Course: Patient was admitted following robot-assisted laparoscopic sacrocolpopexy.  She has done well.  Vaginal packing and Foley catheter were removed this morning.  She does CIC and has been able to cath today.  She is tolerating a regular diet.  Consults:  none  Significant Diagnostic Studies: none  Treatments: surgery: Robotic assisted laparoscopic sacrocolpopexy  Discharge Exam: Blood pressure 95/61, pulse 70, temperature 97.6 F (36.4 C), temperature source Oral, resp. rate 18, height 5\' 5"  (1.651 m), weight 68 kg, SpO2 95 %. No acute distress, watching TV and talking with her daughter Cardiovascular-regular rate and rhythm Respiratory-regular effort and depth Abdomen-soft and nontender-incisions are clean dry and intact Extremity-no calf pain or swelling  Disposition: Discharge disposition: 01-Home or Self Care        Allergies as of 03/05/2021       Reactions   Bee Venom Anaphylaxis   Estrogens    Breast pain   Pneumococcal Vaccines    rash   Lexapro [escitalopram Oxalate] Rash   edema   Sulfa Antibiotics Rash        Medication List     STOP taking these medications    Align 4 MG Caps   aspirin 81 MG tablet   cholecalciferol 1000 units tablet Commonly known as: VITAMIN D   HYDROcodone-acetaminophen 5-325 MG tablet Commonly known as: NORCO/VICODIN   nitrofurantoin 50 MG capsule Commonly known as: MACRODANTIN   PRESERVISION AREDS 2 PO   vitamin C 1000 MG tablet       TAKE these medications    ALPRAZolam 0.25 MG tablet Commonly known as: XANAX Take 0.25 mg by mouth at bedtime as needed for anxiety.   atorvastatin 80 MG tablet Commonly  known as: LIPITOR Take 1 tablet (80 mg total) by mouth daily.   cephALEXin 500 MG capsule Commonly known as: KEFLEX Take 1 capsule (500 mg total) by mouth 2 (two) times daily.   denosumab 60 MG/ML Sosy injection Commonly known as: PROLIA Inject 60 mg into the skin every 6 (six) months.   docusate sodium 100 MG capsule Commonly known as: COLACE Take 1 capsule (100 mg total) by mouth 2 (two) times daily.   estradiol 0.1 MG/GM vaginal cream Commonly known as: ESTRACE VAGINAL Place 1 Applicatorful vaginally 3 (three) times a week. Use 1 small dolyp of cream on tip of index finger and swap the inside of the vagina   lisinopril 5 MG tablet Commonly known as: ZESTRIL Take 1 tablet (5 mg total) by mouth daily. OR AS DIRECTED BY PHYSICIAN   nitroGLYCERIN 0.4 MG SL tablet Commonly known as: Nitrostat 1 TABLET UNDER THE TONGUE EVERY 5 MINUTES FOR UP TO 3 DOSES AS NEEDEDFOR CHEST PAIN.   Shingrix injection Generic drug: Zoster Vaccine Adjuvanted   traMADol 50 MG tablet Commonly known as: Ultram Take 1-2 tablets (50-100 mg total) by mouth every 6 (six) hours as needed for moderate pain or severe pain.        Follow-up Information     Hollace Hayward, NP Follow up on 03/18/2021.   Why: at 1:45 Contact information: [address]. [place] [address] [phone]                 Signed:  Festus Aloe 03/05/2021, 4:08 PM

## 2021-03-05 NOTE — Progress Notes (Signed)
Patient's Foley and vaginal packing was taken out on 03/05/21 at 0530. Patient had 450 mL of urine output through the night shift. Patient also got up and walked about 110 feet and also sat up on side of the bed. Patient tolerated the mobility well.

## 2021-03-05 NOTE — Progress Notes (Signed)
End of shift  Pt A&OX4, foley removed, no complaints of pain, performed self cath with 350 urine output.  Pt ambulated in the hallway 2tx this shift, passing gas.

## 2021-03-07 ENCOUNTER — Encounter (HOSPITAL_COMMUNITY): Payer: Self-pay | Admitting: Urology

## 2021-03-08 ENCOUNTER — Emergency Department (HOSPITAL_BASED_OUTPATIENT_CLINIC_OR_DEPARTMENT_OTHER): Payer: PPO

## 2021-03-08 ENCOUNTER — Encounter (HOSPITAL_BASED_OUTPATIENT_CLINIC_OR_DEPARTMENT_OTHER): Payer: Self-pay

## 2021-03-08 ENCOUNTER — Other Ambulatory Visit: Payer: Self-pay

## 2021-03-08 ENCOUNTER — Emergency Department (HOSPITAL_BASED_OUTPATIENT_CLINIC_OR_DEPARTMENT_OTHER)
Admission: EM | Admit: 2021-03-08 | Discharge: 2021-03-08 | Disposition: A | Discharge status: Home / Self Care | Payer: PPO | Attending: Emergency Medicine | Admitting: Emergency Medicine

## 2021-03-08 DIAGNOSIS — K449 Diaphragmatic hernia without obstruction or gangrene: Secondary | ICD-10-CM | POA: Diagnosis not present

## 2021-03-08 DIAGNOSIS — R600 Localized edema: Secondary | ICD-10-CM | POA: Diagnosis not present

## 2021-03-08 DIAGNOSIS — J9811 Atelectasis: Secondary | ICD-10-CM | POA: Diagnosis not present

## 2021-03-08 DIAGNOSIS — M7989 Other specified soft tissue disorders: Secondary | ICD-10-CM

## 2021-03-08 DIAGNOSIS — R9431 Abnormal electrocardiogram [ECG] [EKG]: Secondary | ICD-10-CM | POA: Diagnosis not present

## 2021-03-08 DIAGNOSIS — I517 Cardiomegaly: Secondary | ICD-10-CM | POA: Diagnosis not present

## 2021-03-08 DIAGNOSIS — R2243 Localized swelling, mass and lump, lower limb, bilateral: Secondary | ICD-10-CM | POA: Insufficient documentation

## 2021-03-08 LAB — CBC WITH DIFFERENTIAL/PLATELET
Abs Immature Granulocytes: 0.02 10*3/uL (ref 0.00–0.07)
Basophils Absolute: 0.1 10*3/uL (ref 0.0–0.1)
Basophils Relative: 1 %
Eosinophils Absolute: 0.4 10*3/uL (ref 0.0–0.5)
Eosinophils Relative: 6 %
HCT: 33.5 % — ABNORMAL LOW (ref 36.0–46.0)
Hemoglobin: 11.1 g/dL — ABNORMAL LOW (ref 12.0–15.0)
Immature Granulocytes: 0 %
Lymphocytes Relative: 24 %
Lymphs Abs: 1.7 10*3/uL (ref 0.7–4.0)
MCH: 31.9 pg (ref 26.0–34.0)
MCHC: 33.1 g/dL (ref 30.0–36.0)
MCV: 96.3 fL (ref 80.0–100.0)
Monocytes Absolute: 0.7 10*3/uL (ref 0.1–1.0)
Monocytes Relative: 10 %
Neutro Abs: 4.1 10*3/uL (ref 1.7–7.7)
Neutrophils Relative %: 59 %
Platelets: 158 10*3/uL (ref 150–400)
RBC: 3.48 MIL/uL — ABNORMAL LOW (ref 3.87–5.11)
RDW: 13.2 % (ref 11.5–15.5)
WBC: 6.9 10*3/uL (ref 4.0–10.5)
nRBC: 0 % (ref 0.0–0.2)

## 2021-03-08 LAB — BASIC METABOLIC PANEL
Anion gap: 8 (ref 5–15)
BUN: 14 mg/dL (ref 8–23)
CO2: 27 mmol/L (ref 22–32)
Calcium: 9.5 mg/dL (ref 8.9–10.3)
Chloride: 102 mmol/L (ref 98–111)
Creatinine, Ser: 0.8 mg/dL (ref 0.44–1.00)
GFR, Estimated: >60 mL/min (ref >60)
Glucose, Bld: 91 mg/dL (ref 70–99)
Potassium: 4 mmol/L (ref 3.5–5.1)
Sodium: 137 mmol/L (ref 135–145)

## 2021-03-08 LAB — BRAIN NATRIURETIC PEPTIDE: B Natriuretic Peptide: 50.4 pg/mL (ref 0.0–100.0)

## 2021-03-08 MED ORDER — SODIUM CHLORIDE 0.9 % IV SOLN: INTRAVENOUS | Status: DC

## 2021-03-08 MED ORDER — IOHEXOL 350 MG/ML SOLN
100.0000 mL | Freq: Once | INTRAVENOUS | Status: AC | PRN
  Administered 2021-03-08: 80 mL via INTRAVENOUS

## 2021-03-08 NOTE — ED Provider Notes (Signed)
[facility] EMERGENCY DEPT Provider Note   CSN: [id number] Arrival date & time: 03/08/21  1648     History  Chief Complaint  Patient presents with   Foot Swelling    Erin Roth is a 88 y.o. female.  88 year old female who presents with bilateral lower extremity swelling times several days.  Patient recently had pelvic surgery which lasted for several hours.  Has had some chest uneasiness for several days which comes and goes which has not been associated with dizziness, diaphoresis, dyspnea.  No prior history of PE or DVT.  Patient states that she has not had any dyspnea exertion.  Otherwise feels at her normal state.  No prior history of lower extremity edema      Home Medications Prior to Admission medications   Medication Sig Start Date End Date Taking? Authorizing Provider  ALPRAZolam Duanne Moron) 0.25 MG tablet Take 0.25 mg by mouth at bedtime as needed for anxiety.    [provider]  atorvastatin (LIPITOR) 80 MG tablet Take 1 tablet (80 mg total) by mouth daily. 02/25/21   Minus Breeding, MD  cephALEXin (KEFLEX) 500 MG capsule Take 1 capsule (500 mg total) by mouth 2 (two) times daily. 03/04/21   Debbrah Alar, PA-C  denosumab (PROLIA) 60 MG/ML SOSY injection Inject 60 mg into the skin every 6 (six) months.    [provider]  docusate sodium (COLACE) 100 MG capsule Take 1 capsule (100 mg total) by mouth 2 (two) times daily. 03/04/21   Debbrah Alar, PA-C  estradiol (ESTRACE VAGINAL) 0.1 MG/GM vaginal cream Place 1 Applicatorful vaginally 3 (three) times a week. Use 1 small dolyp of cream on tip of index finger and swap the inside of the vagina 03/04/21   [name], MD  lisinopril (ZESTRIL) 5 MG tablet Take 1 tablet (5 mg total) by mouth daily. OR AS DIRECTED BY PHYSICIAN 02/25/21   Minus Breeding, MD  nitroGLYCERIN (NITROSTAT) 0.4 MG SL tablet 1 TABLET UNDER THE TONGUE EVERY 5 MINUTES FOR UP TO 3 DOSES AS NEEDEDFOR CHEST PAIN. 02/25/21    Minus Breeding, MD  [facility] injection  12/16/18   [provider]  traMADol (ULTRAM) 50 MG tablet Take 1-2 tablets (50-100 mg total) by mouth every 6 (six) hours as needed for moderate pain or severe pain. 03/04/21   Debbrah Alar, PA-C      Allergies    Bee venom, Estrogens, Pneumococcal vaccines, Lexapro [escitalopram oxalate], and Sulfa antibiotics    Review of Systems   Review of Systems  All other systems reviewed and are negative.  Physical Exam Updated Vital Signs BP 132/81    Pulse 70    Temp (!) 97.2 F (36.2 C)    Resp 18    Ht 1.651 m (5\' 5" )    Wt 68 kg    SpO2 99%    BMI 24.95 kg/m  Physical Exam Vitals and nursing note reviewed.  Constitutional:      General: She is not in acute distress.    Appearance: Normal appearance. She is well-developed. She is not toxic-appearing.  HENT:     Head: Normocephalic and atraumatic.  Eyes:     General: Lids are normal.     Conjunctiva/sclera: Conjunctivae normal.     Pupils: Pupils are equal, round, and reactive to light.  Neck:     Thyroid: No thyroid mass.     Trachea: No tracheal deviation.  Cardiovascular:     Rate and Rhythm: Normal rate and regular  rhythm.     Heart sounds: Normal heart sounds. No murmur heard.   No gallop.  Pulmonary:     Effort: Pulmonary effort is normal. No respiratory distress.     Breath sounds: Normal breath sounds. No stridor. No decreased breath sounds, wheezing, rhonchi or rales.  Abdominal:     General: There is no distension.     Palpations: Abdomen is soft.     Tenderness: There is no abdominal tenderness. There is no rebound.  Musculoskeletal:        General: No tenderness. Normal range of motion.     Cervical back: Normal range of motion and neck supple.  Lymphadenopathy:     Comments: 2+ bilateral lower extremity pitting edema worse on the left compared to right  Skin:    General: Skin is warm and dry.     Findings: No abrasion or rash.  Neurological:     Mental Status:  She is alert and oriented to person, place, and time. Mental status is at baseline.     GCS: GCS eye subscore is 4. GCS verbal subscore is 5. GCS motor subscore is 6.     Cranial Nerves: No cranial nerve deficit.     Sensory: No sensory deficit.     Motor: Motor function is intact.  Psychiatric:        Attention and Perception: Attention normal.        Speech: Speech normal.        Behavior: Behavior normal.    ED Results / Procedures / Treatments   Labs (all labs ordered are listed, but only abnormal results are displayed) Labs Reviewed - No data to display  EKG None  Radiology No results found.  Procedures Procedures    Medications Ordered in ED Medications - No data to display  ED Course/ Medical Decision Making/ A&P                           Medical Decision Making Amount and/or Complexity of Data Reviewed Labs: ordered. Radiology: ordered. ECG/medicine tests: ordered.  Risk Prescription drug management.  Old records reviewed and family present during visit. Patient here with lower extremity edema.  No evidence of renal insufficiency or failure.  No evidence of CHF.  Concern for possible DVT or PE given her symptoms.  Lower extremity Dopplers negative for DVT.  CT of the chest negative for PE.  Patient be discharged home with follow-up with her doctor       Final Clinical Impression(s) / ED Diagnoses Final diagnoses:  None    Rx / DC Orders ED Discharge Orders     None         Lacretia Leigh, MD 03/08/21 2107

## 2021-03-08 NOTE — ED Notes (Addendum)
Patient transported to CT/ [place]

## 2021-03-08 NOTE — ED Triage Notes (Signed)
Patient here POV from Home with Foot/Ankle Swelling.  Patient endorses recent Pelvic Floor Reconstruction Surgery on Friday and today she noticed Bilteral Foot/Ankle Swelling (Mainly to Left).  Tightness to Same. No Pain. No Fevers. Pulses Palpable in Both.  NAD Noted during Triage. A&Ox4. GCS 15. Ambulatory;

## 2021-03-18 DIAGNOSIS — N3020 Other chronic cystitis without hematuria: Secondary | ICD-10-CM | POA: Diagnosis not present

## 2021-03-18 DIAGNOSIS — N8111 Cystocele, midline: Secondary | ICD-10-CM | POA: Diagnosis not present

## 2021-03-30 DIAGNOSIS — R339 Retention of urine, unspecified: Secondary | ICD-10-CM | POA: Diagnosis not present

## 2021-04-14 ENCOUNTER — Ambulatory Visit (INDEPENDENT_AMBULATORY_CARE_PROVIDER_SITE_OTHER): Payer: PPO | Admitting: Ophthalmology

## 2021-04-14 ENCOUNTER — Other Ambulatory Visit: Payer: Self-pay

## 2021-04-14 ENCOUNTER — Encounter (INDEPENDENT_AMBULATORY_CARE_PROVIDER_SITE_OTHER): Payer: Self-pay | Admitting: Ophthalmology

## 2021-04-14 DIAGNOSIS — H353222 Exudative age-related macular degeneration, left eye, with inactive choroidal neovascularization: Secondary | ICD-10-CM

## 2021-04-14 DIAGNOSIS — H353132 Nonexudative age-related macular degeneration, bilateral, intermediate dry stage: Secondary | ICD-10-CM | POA: Diagnosis not present

## 2021-04-14 DIAGNOSIS — H35722 Serous detachment of retinal pigment epithelium, left eye: Secondary | ICD-10-CM | POA: Diagnosis not present

## 2021-04-14 NOTE — Assessment & Plan Note (Signed)
No sign of CNVM OS 

## 2021-04-14 NOTE — Progress Notes (Signed)
? ? ?04/14/2021 ? ?  ? ?CHIEF COMPLAINT ?Patient presents for  ?Chief Complaint  ?Patient presents with  ? Retina Follow Up  ? ? ? ? ?HISTORY OF PRESENT ILLNESS: ?Erin Roth is a 88 y.o. female who presents to the clinic today for:  ? ?HPI   ? ? Retina Follow Up   ? ?      ? Diagnosis: Other  ? Laterality: left eye  ? ?  ?  ? ? Comments   ?7 mos fu ou oct fp. ?Pt states no changes in vision and medical history. ?Pt denies floaters and FOL. ? ? ? ? ?  ?  ?Last edited by Silvestre Moment on 04/14/2021  1:01 PM.  ?  ? ? ?Referring physician: ?[name], MD ?[address] ?[address] ?[place],  [place] [address] ? ?HISTORICAL INFORMATION:  ? ?Selected notes from the [facility] ?  ?   ? ?CURRENT MEDICATIONS: ?No current outpatient medications on file. (Ophthalmic Drugs)  ? ?No current facility-administered medications for this visit. (Ophthalmic Drugs)  ? ?Current Outpatient Medications (Other)  ?Medication Sig  ? ALPRAZolam (XANAX) 0.25 MG tablet Take 0.25 mg by mouth at bedtime as needed for anxiety.  ? atorvastatin (LIPITOR) 80 MG tablet Take 1 tablet (80 mg total) by mouth daily.  ? cephALEXin (KEFLEX) 500 MG capsule Take 1 capsule (500 mg total) by mouth 2 (two) times daily.  ? denosumab (PROLIA) 60 MG/ML SOSY injection Inject 60 mg into the skin every 6 (six) months.  ? docusate sodium (COLACE) 100 MG capsule Take 1 capsule (100 mg total) by mouth 2 (two) times daily.  ? estradiol (ESTRACE VAGINAL) 0.1 MG/GM vaginal cream Place 1 Applicatorful vaginally 3 (three) times a week. Use 1 small dolyp of cream on tip of index finger and swap the inside of the vagina  ? lisinopril (ZESTRIL) 5 MG tablet Take 1 tablet (5 mg total) by mouth daily. OR AS DIRECTED BY PHYSICIAN  ? nitroGLYCERIN (NITROSTAT) 0.4 MG SL tablet 1 TABLET UNDER THE TONGUE EVERY 5 MINUTES FOR UP TO 3 DOSES AS NEEDEDFOR CHEST PAIN.  ? SHINGRIX injection   ? traMADol (ULTRAM) 50 MG tablet Take 1-2 tablets (50-100 mg total) by mouth every 6 (six) hours  as needed for moderate pain or severe pain.  ? ?No current facility-administered medications for this visit. (Other)  ? ? ? ? ?REVIEW OF SYSTEMS: ?ROS   ?Negative for: Constitutional, Gastrointestinal, Neurological, Skin, Genitourinary, Musculoskeletal, HENT, Endocrine, Cardiovascular, Eyes, Respiratory, Psychiatric, Allergic/Imm, Heme/Lymph ?Last edited by Silvestre Moment on 04/14/2021  1:01 PM.  ?  ? ? ? ?ALLERGIES ?Allergies  ?Allergen Reactions  ? Bee Venom Anaphylaxis  ? Estrogens   ?  Breast pain  ? Pneumococcal Vaccines   ?  rash  ? Lexapro [Escitalopram Oxalate] Rash  ?  edema  ? Sulfa Antibiotics Rash  ? ? ?PAST MEDICAL HISTORY ?Past Medical History:  ?Diagnosis Date  ? Anxiety   ? Bilateral bunions   ? CAD in native artery   ? a. NSTEMI 2/14 => LHC 03/08/12: Mid LAD 95% (hazy-suggestive of a ruptured plaque), EF 40%, distal anterior, apical and distal inferior HK =>  PCI: Xience DES to the mid LAD.  ? Cancer [facility])   ? skin cancer  ? Cardiomyopathy ([place])   ? LVEF 45% on cath/echo 03/2012, suspected to be ischemic vs stress-induced  ? Depression   ? Diverticulosis   ? Fracture 2013  ? Right foot fracture-  did not require surgery  ? Hyperlipidemia   ? Hypertension   ? Macular degeneration   ? Mild mitral regurgitation   ? a. Echo 03/09/12: Mild focal basal hypertrophy the septum, EF 45-50%, distal anteroseptal, anterolateral, inferolateral, inferoseptal and apical HK, grade 2 diastolic dysfunction, mild MR, PASP 52  ? Myocardial infarction [facility])   ? Osteoporosis   ? Right knee meniscal tear 07/2016  ? Self-catheterizes urinary bladder   ? UTI (lower urinary tract infection)   ? ?Past Surgical History:  ?Procedure Laterality Date  ? ABDOMINAL HYSTERECTOMY  1980  ? [place]  ? CARPAL TUNNEL RELEASE Right 05/2013  ? CATARACT EXTRACTION W/PHACO Left 12/15/2014  ? Dr. Kathrin Penner  ? CATARACT EXTRACTION W/PHACO Right 02/14/2015  ? Dr. Kathrin Penner  ? CORONARY ANGIOPLASTY WITH STENT PLACEMENT  03/11/2012  ? 95% mid LAD  stenosis s/p DES, minimal nonobs dz elsewhere; LVEF 40%, moderate hypokinesis of distal anterior, apical and distal inferior walls, mildly elevated filling pressures, mild pulmonary HTN  ? LEFT HEART CATHETERIZATION WITH CORONARY ANGIOGRAM N/A 03/11/2012  ? Procedure: LEFT HEART CATHETERIZATION WITH CORONARY ANGIOGRAM;  Surgeon: Wellington Hampshire, MD;  Location: [facility] CATH LAB;  Service: Cardiovascular;  Laterality: N/A;  ? reclast  2013  ? ROBOTIC ASSISTED LAPAROSCOPIC SACROCOLPOPEXY N/A 03/04/2021  ? Procedure: XI ROBOTIC ASSISTED LAPAROSCOPIC SACROCOLPOPEXY;  Surgeon: [name], MD;  Location: [facility] ORS;  Service: Urology;  Laterality: N/A;  ? SKIN CANCER EXCISION    ? ? ?FAMILY HISTORY ?Family History  ?Problem Relation Age of Onset  ? Colon cancer Father   ? Hypertension Mother   ? CAD Mother   ? Heart attack Mother   ? CAD Brother 28  ?     CABG  ? Cancer - Lung Brother   ? Cancer - Lung Sister   ? Hypertension Paternal Grandmother   ? Breast cancer Paternal Grandfather   ? ? ?SOCIAL HISTORY ?Social History  ? ?Tobacco Use  ? Smoking status: Never  ? Smokeless tobacco: Never  ?Vaping Use  ? Vaping Use: Never used  ?Substance Use Topics  ? Alcohol use: Yes  ? Drug use: No  ? ?  ? ?  ? ?OPHTHALMIC EXAM: ? ?Base Eye Exam   ? ? Visual Acuity (ETDRS)   ? ?   Right Left  ? Dist cc 20/20 -2 20/25 -2  ? ? Correction: Glasses  ? ?  ?  ? ? Tonometry (Tonopen, 1:08 PM)   ? ?   Right Left  ? Pressure 6 8  ? ?  ?  ? ? Pupils   ? ?   Pupils Dark Light Shape React APD  ? Right PERRL 3 2 Round Brisk None  ? Left PERRL 3 2 Round Brisk None  ? ?  ?  ? ? Visual Fields   ? ?   Left Right  ?  Full Full  ? ?  ?  ? ? Extraocular Movement   ? ?   Right Left  ?  Full Full  ? ?  ?  ? ? Neuro/Psych   ? ? Oriented x3: Yes  ? Mood/Affect: Normal  ? ?  ?  ? ? Dilation   ? ? Both eyes: 1.0% Mydriacyl, 2.5% Phenylephrine @ 1:07 PM  ? ?  ?  ? ?  ? ?Slit Lamp and Fundus Exam   ? ? External Exam   ? ?   Right Left  ? External Normal Normal   ? ?  ?  ? ?  Slit Lamp Exam   ? ?   Right Left  ? Lids/Lashes Normal Normal  ? Conjunctiva/Sclera White and quiet White and quiet  ? Cornea Clear Clear  ? Anterior Chamber Deep and quiet Deep and quiet  ? Iris Round and reactive Round and reactive  ? Lens Posterior chamber intraocular lens Posterior chamber intraocular lens  ? Anterior Vitreous Normal Normal  ? ?  ?  ? ? Fundus Exam   ? ?   Right Left  ? Posterior Vitreous Central vitreous floaters, Posterior vitreous detachment Central vitreous floaters, Posterior vitreous detachment  ? Disc Normal Normal  ? C/D Ratio 0.45 0.3  ? Macula Soft drusen, no macular thickening, no exudates Soft drusen, no macular thickening, no exudates  ? Vessels Normal Normal  ? Periphery Normal Normal  ? ?  ?  ? ?  ? ? ?IMAGING AND PROCEDURES  ?Imaging and Procedures for 04/14/21 ? ?OCT, Retina - OU - Both Eyes   ? ?   ?Right Eye ?Quality was good. Scan locations included subfoveal. Central Foveal Thickness: 284. Progression has been stable. Findings include normal foveal contour, retinal drusen .  ? ?Left Eye ?Quality was good. Scan locations included subfoveal. Central Foveal Thickness: 249. Progression has been stable. Findings include abnormal foveal contour, retinal drusen .  ? ?Notes ?OD, no active maculopathy.  There is no active macular disease.  Subtle retinal drusen of early ARMD present. ? ?OS chronic outer retinal/sub-RPE Fluid process temporally which has been present for out now for some years.  Not active not enlarged as has occurred in the past ? ?  ? ?Color Fundus Photography Optos - OU - Both Eyes   ? ?   ?Right Eye ?Progression has been stable. Disc findings include normal observations. Macula : drusen. Vessels : normal observations. Periphery : normal observations.  ? ?Left Eye ?Progression has been stable. Macula : drusen. Vessels : normal observations. Periphery : normal observations.  ? ?Notes ?Intermediate ARMD OU, incidental PVD with vitreous debris and  floaters OU. ? ? ? ?Retinal periphery and optic nerve OU normal ? ?  ? ? ?  ?  ? ?  ?ASSESSMENT/PLAN: ? ?Serous detachment of retinal pigment epithelium of left eye ?Overall smaller as compared to August as we

## 2021-04-14 NOTE — Assessment & Plan Note (Signed)
No sign of CNVM OD 

## 2021-04-14 NOTE — Assessment & Plan Note (Signed)
Overall smaller as compared to August as well as much smaller as compared to January 2022.  We will continue to monitor and observe ?

## 2021-04-25 DIAGNOSIS — N3020 Other chronic cystitis without hematuria: Secondary | ICD-10-CM | POA: Diagnosis not present

## 2021-05-02 DIAGNOSIS — R339 Retention of urine, unspecified: Secondary | ICD-10-CM | POA: Diagnosis not present

## 2021-05-12 DIAGNOSIS — M62838 Other muscle spasm: Secondary | ICD-10-CM | POA: Diagnosis not present

## 2021-05-12 DIAGNOSIS — M6289 Other specified disorders of muscle: Secondary | ICD-10-CM | POA: Diagnosis not present

## 2021-05-12 DIAGNOSIS — R3914 Feeling of incomplete bladder emptying: Secondary | ICD-10-CM | POA: Diagnosis not present

## 2021-05-12 DIAGNOSIS — N312 Flaccid neuropathic bladder, not elsewhere classified: Secondary | ICD-10-CM | POA: Diagnosis not present

## 2021-05-12 DIAGNOSIS — M6281 Muscle weakness (generalized): Secondary | ICD-10-CM | POA: Diagnosis not present

## 2021-05-16 DIAGNOSIS — M6289 Other specified disorders of muscle: Secondary | ICD-10-CM | POA: Diagnosis not present

## 2021-05-16 DIAGNOSIS — M6281 Muscle weakness (generalized): Secondary | ICD-10-CM | POA: Diagnosis not present

## 2021-05-16 DIAGNOSIS — R339 Retention of urine, unspecified: Secondary | ICD-10-CM | POA: Diagnosis not present

## 2021-05-16 DIAGNOSIS — K5900 Constipation, unspecified: Secondary | ICD-10-CM | POA: Diagnosis not present

## 2021-05-16 DIAGNOSIS — M62838 Other muscle spasm: Secondary | ICD-10-CM | POA: Diagnosis not present

## 2021-05-16 DIAGNOSIS — R3914 Feeling of incomplete bladder emptying: Secondary | ICD-10-CM | POA: Diagnosis not present

## 2021-05-24 DIAGNOSIS — M810 Age-related osteoporosis without current pathological fracture: Secondary | ICD-10-CM | POA: Diagnosis not present

## 2021-05-25 DIAGNOSIS — R3914 Feeling of incomplete bladder emptying: Secondary | ICD-10-CM | POA: Diagnosis not present

## 2021-05-25 DIAGNOSIS — M6289 Other specified disorders of muscle: Secondary | ICD-10-CM | POA: Diagnosis not present

## 2021-05-25 DIAGNOSIS — M62838 Other muscle spasm: Secondary | ICD-10-CM | POA: Diagnosis not present

## 2021-05-25 DIAGNOSIS — M6281 Muscle weakness (generalized): Secondary | ICD-10-CM | POA: Diagnosis not present

## 2021-05-25 DIAGNOSIS — R3982 Chronic bladder pain: Secondary | ICD-10-CM | POA: Diagnosis not present

## 2021-05-25 DIAGNOSIS — K5900 Constipation, unspecified: Secondary | ICD-10-CM | POA: Diagnosis not present

## 2021-05-30 DIAGNOSIS — R339 Retention of urine, unspecified: Secondary | ICD-10-CM | POA: Diagnosis not present

## 2021-06-09 DIAGNOSIS — M62838 Other muscle spasm: Secondary | ICD-10-CM | POA: Diagnosis not present

## 2021-06-09 DIAGNOSIS — M6289 Other specified disorders of muscle: Secondary | ICD-10-CM | POA: Diagnosis not present

## 2021-06-09 DIAGNOSIS — K5900 Constipation, unspecified: Secondary | ICD-10-CM | POA: Diagnosis not present

## 2021-06-09 DIAGNOSIS — M6281 Muscle weakness (generalized): Secondary | ICD-10-CM | POA: Diagnosis not present

## 2021-06-14 DIAGNOSIS — Z85828 Personal history of other malignant neoplasm of skin: Secondary | ICD-10-CM | POA: Diagnosis not present

## 2021-06-14 DIAGNOSIS — D1723 Benign lipomatous neoplasm of skin and subcutaneous tissue of right leg: Secondary | ICD-10-CM | POA: Diagnosis not present

## 2021-06-14 DIAGNOSIS — I8392 Asymptomatic varicose veins of left lower extremity: Secondary | ICD-10-CM | POA: Diagnosis not present

## 2021-06-14 DIAGNOSIS — D225 Melanocytic nevi of trunk: Secondary | ICD-10-CM | POA: Diagnosis not present

## 2021-06-14 DIAGNOSIS — L814 Other melanin hyperpigmentation: Secondary | ICD-10-CM | POA: Diagnosis not present

## 2021-06-14 DIAGNOSIS — L821 Other seborrheic keratosis: Secondary | ICD-10-CM | POA: Diagnosis not present

## 2021-06-14 DIAGNOSIS — L720 Epidermal cyst: Secondary | ICD-10-CM | POA: Diagnosis not present

## 2021-06-14 DIAGNOSIS — L570 Actinic keratosis: Secondary | ICD-10-CM | POA: Diagnosis not present

## 2021-06-14 DIAGNOSIS — D692 Other nonthrombocytopenic purpura: Secondary | ICD-10-CM | POA: Diagnosis not present

## 2021-07-06 DIAGNOSIS — R339 Retention of urine, unspecified: Secondary | ICD-10-CM | POA: Diagnosis not present

## 2021-08-01 DIAGNOSIS — N3020 Other chronic cystitis without hematuria: Secondary | ICD-10-CM | POA: Diagnosis not present

## 2021-08-01 DIAGNOSIS — N312 Flaccid neuropathic bladder, not elsewhere classified: Secondary | ICD-10-CM | POA: Diagnosis not present

## 2021-08-01 DIAGNOSIS — N8111 Cystocele, midline: Secondary | ICD-10-CM | POA: Diagnosis not present

## 2021-08-03 DIAGNOSIS — R339 Retention of urine, unspecified: Secondary | ICD-10-CM | POA: Diagnosis not present

## 2021-08-22 DIAGNOSIS — N390 Urinary tract infection, site not specified: Secondary | ICD-10-CM | POA: Diagnosis not present

## 2021-09-06 DIAGNOSIS — R339 Retention of urine, unspecified: Secondary | ICD-10-CM | POA: Diagnosis not present

## 2021-10-06 DIAGNOSIS — R339 Retention of urine, unspecified: Secondary | ICD-10-CM | POA: Diagnosis not present

## 2021-10-19 ENCOUNTER — Ambulatory Visit (INDEPENDENT_AMBULATORY_CARE_PROVIDER_SITE_OTHER): Payer: PPO | Admitting: Ophthalmology

## 2021-10-19 ENCOUNTER — Encounter (INDEPENDENT_AMBULATORY_CARE_PROVIDER_SITE_OTHER): Payer: Self-pay | Admitting: Ophthalmology

## 2021-10-19 ENCOUNTER — Encounter (INDEPENDENT_AMBULATORY_CARE_PROVIDER_SITE_OTHER): Payer: PPO | Admitting: Ophthalmology

## 2021-10-19 DIAGNOSIS — H43813 Vitreous degeneration, bilateral: Secondary | ICD-10-CM

## 2021-10-19 DIAGNOSIS — H35722 Serous detachment of retinal pigment epithelium, left eye: Secondary | ICD-10-CM

## 2021-10-19 DIAGNOSIS — H353132 Nonexudative age-related macular degeneration, bilateral, intermediate dry stage: Secondary | ICD-10-CM

## 2021-10-19 DIAGNOSIS — H353222 Exudative age-related macular degeneration, left eye, with inactive choroidal neovascularization: Secondary | ICD-10-CM | POA: Diagnosis not present

## 2021-10-19 NOTE — Assessment & Plan Note (Signed)
No signs of recurrence 

## 2021-10-19 NOTE — Assessment & Plan Note (Signed)
No recurrence of CNVM OS.  Now stable for years

## 2021-10-19 NOTE — Assessment & Plan Note (Signed)
No sign of CNVM OD 

## 2021-10-19 NOTE — Progress Notes (Signed)
10/19/2021     CHIEF COMPLAINT Patient presents for  Chief Complaint  Patient presents with   Macular Degeneration      HISTORY OF PRESENT ILLNESS: Erin Roth is a 88 y.o. female who presents to the clinic today for:   HPI   Serous detachment of retinal pigment epithelium of left eye  With HX of COMBO therapy with PDT, antivegf for serous component of Wet ARMD  6 mth dilate ou color fp oct Pt states her vision has been stable Pt denies any new floaters or FOL Last edited by Hurman Horn, MD on 10/19/2021  8:54 AM.      Referring physician: [name], MD [place],  [place] [address]  HISTORICAL INFORMATION:   Selected notes from the [facility]: No current outpatient medications on file. (Ophthalmic Drugs)   No current facility-administered medications for this visit. (Ophthalmic Drugs)   Current Outpatient Medications (Other)  Medication Sig   ALPRAZolam (XANAX) 0.25 MG tablet Take 0.25 mg by mouth at bedtime as needed for anxiety.   atorvastatin (LIPITOR) 80 MG tablet Take 1 tablet (80 mg total) by mouth daily.   cephALEXin (KEFLEX) 500 MG capsule Take 1 capsule (500 mg total) by mouth 2 (two) times daily.   denosumab (PROLIA) 60 MG/ML SOSY injection Inject 60 mg into the skin every 6 (six) months.   docusate sodium (COLACE) 100 MG capsule Take 1 capsule (100 mg total) by mouth 2 (two) times daily.   estradiol (ESTRACE VAGINAL) 0.1 MG/GM vaginal cream Place 1 Applicatorful vaginally 3 (three) times a week. Use 1 small dolyp of cream on tip of index finger and swap the inside of the vagina   lisinopril (ZESTRIL) 5 MG tablet Take 1 tablet (5 mg total) by mouth daily. OR AS DIRECTED BY PHYSICIAN   nitroGLYCERIN (NITROSTAT) 0.4 MG SL tablet 1 TABLET UNDER THE TONGUE EVERY 5 MINUTES FOR UP TO 3 DOSES AS NEEDEDFOR CHEST PAIN.   SHINGRIX injection    traMADol (ULTRAM) 50 MG tablet Take 1-2 tablets  (50-100 mg total) by mouth every 6 (six) hours as needed for moderate pain or severe pain.   No current facility-administered medications for this visit. (Other)      REVIEW OF SYSTEMS: ROS   Negative for: Constitutional, Gastrointestinal, Neurological, Skin, Genitourinary, Musculoskeletal, HENT, Endocrine, Cardiovascular, Eyes, Respiratory, Psychiatric, Allergic/Imm, Heme/Lymph Last edited by Morene Rankins, CMA on 10/19/2021  8:05 AM.       ALLERGIES Allergies  Allergen Reactions   Bee Venom Anaphylaxis   Estrogens     Breast pain   Pneumococcal Vaccines     rash   Lexapro [Escitalopram Oxalate] Rash    edema   Sulfa Antibiotics Rash    PAST MEDICAL HISTORY Past Medical History:  Diagnosis Date   Anxiety    Bilateral bunions    CAD in native artery    a. NSTEMI 2/14 => LHC 03/08/12: Mid LAD 95% (hazy-suggestive of a ruptured plaque), EF 40%, distal anterior, apical and distal inferior HK =>  PCI: Xience DES to the mid LAD.   Cancer ([place])    skin cancer   Cardiomyopathy ([place])    LVEF 45% on cath/echo 03/2012, suspected to be ischemic vs stress-induced   Depression    Diverticulosis    Fracture 2013   Right foot fracture- did not require surgery   Hyperlipidemia    Hypertension  Macular degeneration    Mild mitral regurgitation    a. Echo 03/09/12: Mild focal basal hypertrophy the septum, EF 45-50%, distal anteroseptal, anterolateral, inferolateral, inferoseptal and apical HK, grade 2 diastolic dysfunction, mild MR, PASP 52   Myocardial infarction [facility])    Osteoporosis    Right knee meniscal tear 07/2016   Self-catheterizes urinary bladder    UTI (lower urinary tract infection)    Past Surgical History:  Procedure Laterality Date   ABDOMINAL HYSTERECTOMY  1980   BACK SURGERY  1986   CARPAL TUNNEL RELEASE Right 05/2013   CATARACT EXTRACTION W/PHACO Left 12/15/2014   Dr. Kathrin Penner   CATARACT EXTRACTION W/PHACO Right 02/14/2015   Dr. Kathrin Penner   CORONARY  ANGIOPLASTY WITH STENT PLACEMENT  03/11/2012   95% mid LAD stenosis s/p DES, minimal nonobs dz elsewhere; LVEF 40%, moderate hypokinesis of distal anterior, apical and distal inferior walls, mildly elevated filling pressures, mild pulmonary HTN   LEFT HEART CATHETERIZATION WITH CORONARY ANGIOGRAM N/A 03/11/2012   Procedure: LEFT HEART CATHETERIZATION WITH CORONARY ANGIOGRAM;  Surgeon: Wellington Hampshire, MD;  Location: [place] CATH [facility];  Service: Cardiovascular;  Laterality: N/A;   reclast  2013   ROBOTIC ASSISTED LAPAROSCOPIC SACROCOLPOPEXY N/A 03/04/2021   Procedure: XI ROBOTIC ASSISTED LAPAROSCOPIC SACROCOLPOPEXY;  Surgeon: [name], MD;  Location: [facility];  Service: Urology;  Laterality: N/A;   SKIN CANCER EXCISION      FAMILY HISTORY Family History  Problem Relation Age of Onset   Colon cancer Father    Hypertension Mother    CAD Mother    Heart attack Mother    CAD Brother 44       CABG   Cancer - Lung Brother    Cancer - Lung Sister    Hypertension Paternal Grandmother    Breast cancer Paternal Grandfather     SOCIAL HISTORY Social History   Tobacco Use   Smoking status: Never   Smokeless tobacco: Never  Vaping Use   Vaping Use: Never used  Substance Use Topics   Alcohol use: Yes   Drug use: No         OPHTHALMIC EXAM:  Base Eye Exam     Visual Acuity (ETDRS)       Right Left   Dist cc 20/20 -1 20/30 +1    Correction: Glasses         Tonometry (Tonopen, 8:09 AM)       Right Left   Pressure 9 9         Pupils       Pupils APD   Right PERRL None   Left PERRL None         Visual Fields       Left Right    Full Full         Extraocular Movement       Right Left    Ortho Ortho    -- -- --  --  --  -- -- --   -- -- --  --  --  -- -- --           Neuro/Psych     Oriented x3: Yes   Mood/Affect: Normal         Dilation     Both eyes: 1.0% Mydriacyl, 2.5% Phenylephrine @ 8:06 AM           Slit Lamp and  Fundus Exam     External Exam       Right Left  External Normal Normal         Slit Lamp Exam       Right Left   Lids/Lashes Normal Normal   Conjunctiva/Sclera White and quiet White and quiet   Cornea Clear Clear   Anterior Chamber Deep and quiet Deep and quiet   Iris Round and reactive Round and reactive   Lens Posterior chamber intraocular lens Posterior chamber intraocular lens   Anterior Vitreous Normal Normal         Fundus Exam       Right Left   Posterior Vitreous Central vitreous floaters, Posterior vitreous detachment Central vitreous floaters, Posterior vitreous detachment   Disc Normal Normal   C/D Ratio 0.45 0.3   Macula Soft drusen, no macular thickening, no exudates Soft drusen, no macular thickening, no exudates   Vessels Normal Normal   Periphery Normal Normal            IMAGING AND PROCEDURES  Imaging and Procedures for 10/19/21  OCT, Retina - OU - Both Eyes       Right Eye Quality was good. Scan locations included subfoveal. Central Foveal Thickness: 286. Progression has been stable. Findings include normal foveal contour, retinal drusen .   Left Eye Quality was good. Scan locations included subfoveal. Central Foveal Thickness: 248. Progression has been stable. Findings include abnormal foveal contour, retinal drusen .   Notes OD, no active maculopathy.  There is no active macular disease.  Subtle retinal drusen of early ARMD present.  OS chronic outer retinal/sub-RPE Fluid process temporally which has been present for out now for some years.  Not active not enlarged as has occurred in the past     Color Fundus Photography Optos - OU - Both Eyes       Right Eye Progression has been stable. Disc findings include normal observations. Macula : drusen. Vessels : normal observations. Periphery : normal observations.   Left Eye Progression has been stable. Macula : drusen. Vessels : normal observations. Periphery : normal observations.    Notes Intermediate ARMD OU, incidental PVD with vitreous debris and floaters OU.    Retinal periphery and optic nerve OU normal             ASSESSMENT/PLAN:  Serous detachment of retinal pigment epithelium of left eye No signs of recurrence.  Intermediate stage nonexudative age-related macular degeneration of both eyes No sign of CNVM OD.  Exudative age-related macular degeneration of left eye with inactive choroidal neovascularization (HCC) No recurrence of CNVM OS.  Now stable for years  Posterior vitreous detachment of both eyes Physiologic OU.  accounts for floaters.      ICD-10-CM   1. Serous detachment of retinal pigment epithelium of left eye  H35.722 OCT, Retina - OU - Both Eyes    Color Fundus Photography Optos - OU - Both Eyes    2. Intermediate stage nonexudative age-related macular degeneration of both eyes  H35.3132     3. Exudative age-related macular degeneration of left eye with inactive choroidal neovascularization ([place])  H35.3222     4. Posterior vitreous detachment of both eyes  H43.813       1.  2.  3.  Ophthalmic Meds Ordered this visit:  No orders of the defined types were placed in this encounter.      Return in about 6 months (around 04/19/2022) for COLOR FP, DILATE OU, OCT.  There are no Patient Instructions on file for this visit.   Explained the diagnoses, plan, and  follow up with the patient and they expressed understanding.  Patient expressed understanding of the importance of proper follow up care.   Clent Demark Mauri Tolen M.D. Diseases & Surgery of the Retina and Vitreous [facility] [place] 10/19/21     Abbreviations: M myopia (nearsighted); A astigmatism; H hyperopia (farsighted); P presbyopia; Mrx spectacle prescription;  CTL contact lenses; OD right eye; OS left eye; OU both eyes  XT exotropia; ET esotropia; PEK punctate epithelial keratitis; PEE punctate epithelial erosions; DES dry eye syndrome; MGD  meibomian gland dysfunction; ATs artificial tears; PFAT's preservative free artificial tears; [place] nuclear sclerotic cataract; PSC posterior subcapsular cataract; ERM epi-retinal membrane; PVD posterior vitreous detachment; RD retinal detachment; DM diabetes mellitus; DR diabetic retinopathy; NPDR non-proliferative diabetic retinopathy; PDR proliferative diabetic retinopathy; CSME clinically significant macular edema; DME diabetic macular edema; dbh dot blot hemorrhages; CWS cotton wool spot; POAG primary open angle glaucoma; C/D cup-to-disc ratio; HVF humphrey visual field; GVF goldmann visual field; OCT optical coherence tomography; IOP intraocular pressure; BRVO Branch retinal vein occlusion; CRVO central retinal vein occlusion; CRAO central retinal artery occlusion; BRAO branch retinal artery occlusion; RT retinal tear; SB scleral buckle; PPV pars plana vitrectomy; VH Vitreous hemorrhage; PRP panretinal laser photocoagulation; IVK intravitreal kenalog; VMT vitreomacular traction; MH Macular hole;  NVD neovascularization of the disc; NVE neovascularization elsewhere; AREDS age related eye disease study; ARMD age related macular degeneration; POAG primary open angle glaucoma; EBMD epithelial/anterior basement membrane dystrophy; ACIOL anterior chamber intraocular lens; IOL intraocular lens; PCIOL posterior chamber intraocular lens; Phaco/IOL phacoemulsification with intraocular lens placement; [place] photorefractive keratectomy; LASIK laser assisted in situ keratomileusis; HTN hypertension; DM diabetes mellitus; COPD chronic obstructive pulmonary disease

## 2021-10-19 NOTE — Assessment & Plan Note (Addendum)
Physiologic OU.  accounts for floaters.

## 2021-10-26 DIAGNOSIS — Z23 Encounter for immunization: Secondary | ICD-10-CM | POA: Diagnosis not present

## 2021-11-03 DIAGNOSIS — R339 Retention of urine, unspecified: Secondary | ICD-10-CM | POA: Diagnosis not present

## 2021-11-24 DIAGNOSIS — M810 Age-related osteoporosis without current pathological fracture: Secondary | ICD-10-CM | POA: Diagnosis not present

## 2021-12-09 DIAGNOSIS — R339 Retention of urine, unspecified: Secondary | ICD-10-CM | POA: Diagnosis not present

## 2022-01-03 DIAGNOSIS — F5101 Primary insomnia: Secondary | ICD-10-CM | POA: Diagnosis not present

## 2022-01-03 DIAGNOSIS — Z0000 Encounter for general adult medical examination without abnormal findings: Secondary | ICD-10-CM | POA: Diagnosis not present

## 2022-01-03 DIAGNOSIS — I2510 Atherosclerotic heart disease of native coronary artery without angina pectoris: Secondary | ICD-10-CM | POA: Diagnosis not present

## 2022-01-03 DIAGNOSIS — M810 Age-related osteoporosis without current pathological fracture: Secondary | ICD-10-CM | POA: Diagnosis not present

## 2022-01-03 DIAGNOSIS — I10 Essential (primary) hypertension: Secondary | ICD-10-CM | POA: Diagnosis not present

## 2022-01-03 DIAGNOSIS — Z23 Encounter for immunization: Secondary | ICD-10-CM | POA: Diagnosis not present

## 2022-01-03 DIAGNOSIS — E785 Hyperlipidemia, unspecified: Secondary | ICD-10-CM | POA: Diagnosis not present

## 2022-01-03 DIAGNOSIS — F419 Anxiety disorder, unspecified: Secondary | ICD-10-CM | POA: Diagnosis not present

## 2022-01-03 DIAGNOSIS — H3530 Unspecified macular degeneration: Secondary | ICD-10-CM | POA: Diagnosis not present

## 2022-01-04 DIAGNOSIS — R339 Retention of urine, unspecified: Secondary | ICD-10-CM | POA: Diagnosis not present

## 2022-02-06 DIAGNOSIS — R339 Retention of urine, unspecified: Secondary | ICD-10-CM | POA: Diagnosis not present

## 2022-02-09 DIAGNOSIS — Z1231 Encounter for screening mammogram for malignant neoplasm of breast: Secondary | ICD-10-CM | POA: Diagnosis not present

## 2022-02-09 DIAGNOSIS — M810 Age-related osteoporosis without current pathological fracture: Secondary | ICD-10-CM | POA: Diagnosis not present

## 2022-03-08 DIAGNOSIS — R339 Retention of urine, unspecified: Secondary | ICD-10-CM | POA: Diagnosis not present

## 2022-04-03 DIAGNOSIS — Z03818 Encounter for observation for suspected exposure to other biological agents ruled out: Secondary | ICD-10-CM | POA: Diagnosis not present

## 2022-04-03 DIAGNOSIS — B349 Viral infection, unspecified: Secondary | ICD-10-CM | POA: Diagnosis not present

## 2022-04-03 DIAGNOSIS — R1030 Lower abdominal pain, unspecified: Secondary | ICD-10-CM | POA: Diagnosis not present

## 2022-04-03 DIAGNOSIS — R42 Dizziness and giddiness: Secondary | ICD-10-CM | POA: Diagnosis not present

## 2022-04-05 DIAGNOSIS — R339 Retention of urine, unspecified: Secondary | ICD-10-CM | POA: Diagnosis not present

## 2022-04-19 ENCOUNTER — Encounter (INDEPENDENT_AMBULATORY_CARE_PROVIDER_SITE_OTHER): Payer: PPO | Admitting: Ophthalmology

## 2022-04-19 DIAGNOSIS — H353132 Nonexudative age-related macular degeneration, bilateral, intermediate dry stage: Secondary | ICD-10-CM | POA: Diagnosis not present

## 2022-04-19 DIAGNOSIS — H43813 Vitreous degeneration, bilateral: Secondary | ICD-10-CM | POA: Diagnosis not present

## 2022-04-19 DIAGNOSIS — H35722 Serous detachment of retinal pigment epithelium, left eye: Secondary | ICD-10-CM | POA: Diagnosis not present

## 2022-04-19 DIAGNOSIS — H353222 Exudative age-related macular degeneration, left eye, with inactive choroidal neovascularization: Secondary | ICD-10-CM | POA: Diagnosis not present

## 2022-05-01 DIAGNOSIS — K4190 Unilateral femoral hernia, without obstruction or gangrene, not specified as recurrent: Secondary | ICD-10-CM | POA: Diagnosis not present

## 2022-05-02 ENCOUNTER — Other Ambulatory Visit: Payer: Self-pay | Admitting: Physician Assistant

## 2022-05-02 DIAGNOSIS — K4190 Unilateral femoral hernia, without obstruction or gangrene, not specified as recurrent: Secondary | ICD-10-CM

## 2022-05-11 DIAGNOSIS — I2510 Atherosclerotic heart disease of native coronary artery without angina pectoris: Secondary | ICD-10-CM | POA: Diagnosis not present

## 2022-05-11 DIAGNOSIS — M810 Age-related osteoporosis without current pathological fracture: Secondary | ICD-10-CM | POA: Diagnosis not present

## 2022-05-11 DIAGNOSIS — I10 Essential (primary) hypertension: Secondary | ICD-10-CM | POA: Diagnosis not present

## 2022-05-11 DIAGNOSIS — E785 Hyperlipidemia, unspecified: Secondary | ICD-10-CM | POA: Diagnosis not present

## 2022-05-26 ENCOUNTER — Ambulatory Visit: Payer: Self-pay | Admitting: Surgery

## 2022-05-26 DIAGNOSIS — K4090 Unilateral inguinal hernia, without obstruction or gangrene, not specified as recurrent: Secondary | ICD-10-CM | POA: Diagnosis not present

## 2022-05-26 NOTE — H&P (Signed)
Subjective   Chief Complaint: New Consultation   Cardiology - [place] PCP - Laurann Montana Ref - Wilber Oliphant Turmel   History of Present Illness: Erin Roth is a 89 y.o. female who is seen today as an office consultation at the request of Dr. Fay Records for evaluation of New Consultation .     This is an 89 year old female who is fairly active who presents with recent onset of a right inguinal bulge.  She works in her garden frequently and noticed an uncomfortable bulge in her right groin.  This remains reducible.  She denies any GI obstructive symptoms.   Upon review of her records, the patient had a robotic laparoscopic sacrocolpopexy by Dr. Marlou Porch.  She did very well with that surgery and was discharged after 1 night in the hospital.  Prior to that surgery, in November 2022, she had a CT scan that incidentally noted a right inguinal hernia containing only fat.  She also had a small umbilical hernia.     Review of Systems: A complete review of systems was obtained from the patient.  I have reviewed this information and discussed as appropriate with the patient.  See HPI as well for other ROS.   Review of Systems  Constitutional: Negative.   HENT:  Positive for hearing loss.   Eyes: Negative.   Respiratory: Negative.    Cardiovascular: Negative.   Gastrointestinal:  Positive for abdominal pain.  Genitourinary: Negative.   Musculoskeletal: Negative.   Skin: Negative.   Neurological: Negative.   Endo/Heme/Allergies:  Bruises/bleeds easily.  Psychiatric/Behavioral: Negative.          Medical History: Past Medical History Past Medical History: Diagnosis Date  Arthritis    Hypertension        Problem List Patient Active Problem List Diagnosis  CAD (coronary artery disease), native coronary artery  Anxiety  Cardiomyopathy (CMS/HHS-HCC)  Cystocele with prolapse  Dyslipidemia  Essential hypertension  Intermediate stage nonexudative age-related macular degeneration of both  eyes  History of MI (myocardial infarction)  Hyperlipidemia  Mild pulmonary hypertension (CMS/HHS-HCC)  Mild mitral regurgitation  NSTEMI (non-ST elevated myocardial infarction) (CMS/HHS-HCC)  Right inguinal hernia      Past Surgical History Past Surgical History: Procedure Laterality Date  Back surgery          Allergies Allergies Allergen Reactions  Venom-Honey Bee Anaphylaxis  Estrogens Unknown     Breast pain  Others Other (See Comments)     Uncoded Allergy. Allergen: BEE-STINGS, Other Reaction: EXTREME SWELLING  Prednisone Itching  Sulfa (Sulfonamide Antibiotics) Rash  Escitalopram Rash and Swelling     edema      Medications Ordered Prior to Encounter Current Outpatient Medications on File Prior to Visit Medication Sig Dispense Refill  *atenolol oral        atorvastatin (LIPITOR) 80 MG tablet 1 tablet Orally Once a day for 90 days      cholecalciferol (VITAMIN D3) 1000 unit capsule Take 1 capsule by mouth once daily      hydrocodone-acetaminophen (NORCO) 5-325 mg tablet 1-2 tabs po every 4-6 hours as needed for severe pain (will cause drowsiness).      lisinopriL (ZESTRIL) 5 MG tablet 1 tablet as needed Orally Once a day      nitrofurantoin, macrocrystal-monohydrate, (MACROBID) 100 MG capsule        nitroGLYcerin (NITROSTAT) 0.4 MG SL tablet 1 TABLET UNDER THE TONGUE EVERY 5 MINUTES FOR UP TO 3 DOSES AS NEEDEDFOR CHEST PAIN.        No  current facility-administered medications on file prior to visit.      Family History History reviewed. No pertinent family history.     Tobacco Use History Social History    Tobacco Use Smoking Status Never Smokeless Tobacco Never      Social History Social History    Socioeconomic History  Marital status: Married Tobacco Use  Smoking status: Never  Smokeless tobacco: Never Substance and Sexual Activity  Alcohol use: Never  Drug use: Never      Objective:     Vitals:   05/26/22 0911 BP: (!)  170/90 Pulse: 80 Temp: 36.5 C (97.7 F) SpO2: 96% Weight: 63.5 kg (140 lb) Height: 162.6 cm (5\' 4" ) PainSc: 0-No pain   Body mass index is 24.03 kg/m.   Physical Exam    Constitutional:  WDWN in NAD, conversant, no obvious deformities; lying in bed comfortably Eyes:  Pupils equal, round; sclera anicteric; moist conjunctiva; no lid lag HENT:  Oral mucosa moist; good dentition  Neck:  No masses palpated, trachea midline; no thyromegaly Lungs:  CTA bilaterally; normal respiratory effort CV:  Regular rate and rhythm; no murmurs; extremities well-perfused with no edema Abd:  +bowel sounds, soft, non-tender, no palpable organomegaly; no palpable umbilical hernia The right groin shows a visible palpable inguinal hernia that is reducible.  No sign of left inguinal hernia Musc: Normal gait; no apparent clubbing or cyanosis in extremities Lymphatic:  No palpable cervical or axillary lymphadenopathy Skin:  Warm, dry; no sign of jaundice Psychiatric - alert and oriented x 4; calm mood and affect     Labs, Imaging and Diagnostic Testing: CLINICAL DATA:  Left flank pain, nausea, vomiting and chills.   EXAM: CT ABDOMEN AND PELVIS WITH CONTRAST   TECHNIQUE: Multidetector CT imaging of the abdomen and pelvis was performed using the standard protocol following bolus administration of intravenous contrast.   CONTRAST:  80mL OMNIPAQUE IOHEXOL 350 MG/ML SOLN   COMPARISON:  CT with IV contrast 09/20/2015   FINDINGS: Lower chest: There are scattered linear scar-like opacities. Mild cardiomegaly without pericardial effusion. Moderate-sized hiatal hernia increased in size since prior study.   Hepatobiliary: 18 cm in length liver, mildly steatotic with multiple scattered cysts, largest is septated in the posterior segment of the right hepatic lobe measuring 2.4 cm, previously 1.6 cm. There is no mass enhancement , biliary ductal dilatation or focal gallbladder abnormality.   Pancreas:  Unremarkable.   Spleen: Unremarkable.   Adrenals/Urinary Tract: There is no adrenal mass. Small chronic calcification noted in the lateral limb right adrenal gland. There are few small right renal hypodensities which are too small to characterize. There is no hydronephrosis on the right, no intrarenal stone either side.   On the left, there is increased moderate to severe hydronephrosis to the UPJ, without visible UPJ or ureteral stone or ureteral dilatation. Multiple left renal cysts are again noted and there is increased left perinephric stranding and fluid, and asymmetric left renal cortical contrast retention the latter compatible with obstructive uropathy. The bladder wall and lumen are unremarkable.   Stomach/Bowel: Moderate-sized hiatal hernia. The gastric wall and unopacified small bowel are unremarkable. There is a normal caliber appendix. No evidence of colitis or diverticulitis. Mild-to-moderate fecal stasis.   Vascular/Lymphatic: Aortic atherosclerosis. No enlarged abdominal or pelvic lymph nodes.   Reproductive: The uterus is absent.  No adnexal mass is seen.   Other: There is left perinephric fluid but no other free fluid is seen. There is no free air, hemorrhage or abscess.  Musculoskeletal: Small umbilical and right inguinal fat hernias. Multiple stable pelvic phleboliths. There is osteopenia, S shaped thoracolumbar scoliosis and advanced degenerative change of the lumbar spine with multilevel spinous process abutment. There are chronic compression fractures of T12, L1 and L2.   IMPRESSION: 1. Increased moderate to severe left hydronephrosis to the UPJ with cortical contrast retention consistent with obstructive uropathy, and increased perinephric stranding and fluid which could be due to a caliceal leak, obstructive uropathy or infectious process. There is no appreciable stone. Findings could be due to a recently passed stone, UPJ stenosis or stricture.  Urology consult recommended. 2. Small scattered liver cysts with mild hepatic steatosis. 3. Constipation, scattered diverticulosis. 4. Osteopenia, scoliosis, degenerative changes and chronic compression fractures. 5. Moderate-sized hiatal hernia increased from 2017.     Electronically Signed   By: Almira Bar M.D.   On: 12/27/2020 21:52     Assessment and Plan: Diagnoses and all orders for this visit:   Right inguinal hernia     Since the hernia is becoming more symptomatic, would recommend right inguinal hernia repair with mesh.The surgical procedure has been discussed with the patient.  Potential risks, benefits, alternative treatments, and expected outcomes have been explained.  All of the patient's questions at this time have been answered.  The likelihood of reaching the patient's treatment goal is good.  The patient understand the proposed surgical procedure and wishes to proceed.     We will obtain cardiac clearance prior to scheduling surgery   Kabrea Seeney Delbert Harness, MD  05/26/2022 10:52 AM

## 2022-05-29 ENCOUNTER — Telehealth: Payer: Self-pay | Admitting: *Deleted

## 2022-05-29 NOTE — Telephone Encounter (Signed)
Spoke with patient who is agreeable to to see Azalee Course, PA-C on 5/17 at 8:50 am. Patient thanked me for the call.

## 2022-05-29 NOTE — Telephone Encounter (Signed)
   Pre-operative Risk Assessment    Patient Name: Erin Roth  DOB: 25-Jan-1934 MRN: [id number]      Request for Surgical Clearance    Procedure:   hernia repair  Date of Surgery:  Clearance TBD                                 Surgeon:  Manus Rudd md Surgeon's Group or Practice Name:  [facility] Phone number:  [phone] Fax number:  [phone] attn laura adkins cma   Type of Clearance Requested:   - Medical    Type of Anesthesia:  General    Additional requests/questions:   none  Signed, Deliah Goody   05/29/2022, 12:31 PM

## 2022-05-29 NOTE — Telephone Encounter (Signed)
PLAN:  Name: ANIJAH SPOHR  DOB: January 04, 1934  MRN: [id number]  Primary Cardiologist: Rollene Rotunda, MD  Chart reviewed as part of pre-operative protocol coverage. Because of Giannah A Billick's past medical history and time since last visit, she will require a follow-up in-office visit in order to better assess preoperative cardiovascular risk.  Pre-op covering staff: - Please schedule appointment and call patient to inform them. If patient already had an upcoming appointment within acceptable timeframe, please add "pre-op clearance" to the appointment notes so provider is aware. - Please contact requesting surgeon's office via preferred method (i.e, phone, fax) to inform them of need for appointment prior to surgery.   Napoleon Form, Leodis Rains, NP  05/29/2022, 12:46 PM

## 2022-06-01 DIAGNOSIS — N390 Urinary tract infection, site not specified: Secondary | ICD-10-CM | POA: Diagnosis not present

## 2022-06-12 DIAGNOSIS — R339 Retention of urine, unspecified: Secondary | ICD-10-CM | POA: Diagnosis not present

## 2022-06-16 ENCOUNTER — Other Ambulatory Visit: Payer: PPO

## 2022-06-16 ENCOUNTER — Ambulatory Visit: Payer: PPO | Attending: Physician Assistant | Admitting: Physician Assistant

## 2022-06-16 ENCOUNTER — Encounter: Payer: Self-pay | Admitting: Physician Assistant

## 2022-06-16 VITALS — BP 138/78 | HR 70 | Ht 63.0 in | Wt 140.3 lb

## 2022-06-16 DIAGNOSIS — I10 Essential (primary) hypertension: Secondary | ICD-10-CM | POA: Diagnosis not present

## 2022-06-16 DIAGNOSIS — E785 Hyperlipidemia, unspecified: Secondary | ICD-10-CM

## 2022-06-16 DIAGNOSIS — I2510 Atherosclerotic heart disease of native coronary artery without angina pectoris: Secondary | ICD-10-CM

## 2022-06-16 DIAGNOSIS — Z01818 Encounter for other preprocedural examination: Secondary | ICD-10-CM

## 2022-06-16 NOTE — Progress Notes (Unsigned)
Cardiology Office Note:    Date:  06/18/2022   ID:  Erin Roth, DOB 1933/03/30, MRN [id number]  PCP:  Erin Montana, MD   [facility] Providers Cardiologist:  Erin Rotunda, MD   Referring MD: Erin Montana, MD   Chief Complaint  Patient presents with   Follow-up    Seen for Dr. Antoine Roth   Pre-op Exam    Upcoming hernia repair    History of Present Illness:    Erin Roth is a 89 y.o. female with a hx of CAD, HTN, HLD and mild MR. Patient had PCI of LAD in 2014.  Her presenting symptom at the time was jaw pain.  She has shortness of breath associated with ACE inhibitor.  She was she was seen in the hospital in 2017 for chest pain which was felt to be atypical and not anginal.  Echocardiogram at the time showed dynamic outflow obstruction with Valsalva with peak gradient 13 mmHg.  Subsequent POET was negative.  Last echocardiogram obtained on 11/04/2019 showed EF 60 to 65%, moderate asymmetric LVH of basal septal segment, grade 1 DD, mild MR, mild AI.  Patient was last seen by Dr. Antoine Roth in January 2023 at which time she was doing well.  She was cleared to proceed with laparoscopic sacrocolpopexy.  She presents today for follow-up.  She has upcoming hernia repair surgery by Dr. Manus Roth.  Patient presents today for follow-up.  She just recently returned from [place].  While traveling, she was able to walk several miles a day without exertional chest pain, jaw pain or worsening dyspnea.  Overall, she has been doing very well.  She says she may be a little too active and was carrying something heavy when the hernia began to bother her.  I did encourage her to remain active.  She now has a painful right inguinal hernia that is bothering her.  She has upcoming hernia repair by Dr. Corliss Roth.  Since she has great functional ability, she is at acceptable risk to proceed from the cardiac perspective without further workup.  She may return to see Dr. Pamella Roth annually early  2025.  Past Medical History:  Diagnosis Date   Anxiety    Bilateral bunions    CAD in native artery    a. NSTEMI 2/14 => LHC 03/08/12: Mid LAD 95% (hazy-suggestive of a ruptured plaque), EF 40%, distal anterior, apical and distal inferior HK =>  PCI: Xience DES to the mid LAD.   Cancer (HCC)    skin cancer   Cardiomyopathy (HCC)    LVEF 45% on cath/echo 03/2012, suspected to be ischemic vs stress-induced   Depression    Diverticulosis    Fracture 2013   Right foot fracture- did not require surgery   Hyperlipidemia    Hypertension    Macular degeneration    Mild mitral regurgitation    a. Echo 03/09/12: Mild focal basal hypertrophy the septum, EF 45-50%, distal anteroseptal, anterolateral, inferolateral, inferoseptal and apical HK, grade 2 diastolic dysfunction, mild MR, PASP 52   Myocardial infarction [facility])    Osteoporosis    Right knee meniscal tear 07/2016   Self-catheterizes urinary bladder    UTI (lower urinary tract infection)     Past Surgical History:  Procedure Laterality Date   ABDOMINAL HYSTERECTOMY  1980   BACK SURGERY  1986   CARPAL TUNNEL RELEASE Right 05/2013   CATARACT EXTRACTION W/PHACO Left 12/15/2014   Dr. Dagoberto Roth   CATARACT EXTRACTION W/PHACO Right 02/14/2015  Dr. Dagoberto Roth   CORONARY ANGIOPLASTY WITH STENT PLACEMENT  03/11/2012   95% mid LAD stenosis s/p DES, minimal nonobs dz elsewhere; LVEF 40%, moderate hypokinesis of distal anterior, apical and distal inferior walls, mildly elevated filling pressures, mild pulmonary HTN   LEFT HEART CATHETERIZATION WITH CORONARY ANGIOGRAM N/A 03/11/2012   Procedure: LEFT HEART CATHETERIZATION WITH CORONARY ANGIOGRAM;  Surgeon: Erin Ouch, MD;  Location: [facility] CATH LAB;  Service: Cardiovascular;  Laterality: N/A;   reclast  2013   ROBOTIC ASSISTED LAPAROSCOPIC SACROCOLPOPEXY N/A 03/04/2021   Procedure: XI ROBOTIC ASSISTED LAPAROSCOPIC SACROCOLPOPEXY;  Surgeon: Erin Fat, MD;  Location: [facility] ORS;  Service:  Urology;  Laterality: N/A;   SKIN CANCER EXCISION      Current Medications: Current Meds  Medication Sig   ALPRAZolam (XANAX) 0.25 MG tablet Take 0.25 mg by mouth at bedtime as needed for anxiety.   aspirin EC 81 MG tablet Take 81 mg by mouth once.   atorvastatin (LIPITOR) 80 MG tablet Take 1 tablet (80 mg total) by mouth daily.   Cholecalciferol (D 1000) 25 MCG (1000 UT) capsule Take 1 capsule by mouth daily.   ciprofloxacin (CIPRO) 250 MG tablet Take 250 mg by mouth as needed.   lisinopril (ZESTRIL) 5 MG tablet Take 1 tablet (5 mg total) by mouth daily. OR AS DIRECTED BY PHYSICIAN   Multiple Vitamins-Minerals (ICAPS) CAPS as directed Orally   nitroGLYCERIN (NITROSTAT) 0.4 MG SL tablet 1 TABLET UNDER THE TONGUE EVERY 5 MINUTES FOR UP TO 3 DOSES AS NEEDEDFOR CHEST PAIN.   Omega-3 Fatty Acids (FISH OIL) 1000 MG CAPS Take 1,000 mg by mouth daily.   traMADol (ULTRAM) 50 MG tablet Take 1-2 tablets (50-100 mg total) by mouth every 6 (six) hours as needed for moderate pain or severe pain.     Allergies:   Bee venom, Estrogens, Pneumococcal vaccines, Lexapro [escitalopram oxalate], and Sulfa antibiotics   Social History   Socioeconomic History   Marital status: Married    Spouse name: Not on file   Number of children: 4   Years of education: hs   Highest education level: Not on file  Occupational History   Not on file  Tobacco Use   Smoking status: Never   Smokeless tobacco: Never  Vaping Use   Vaping Use: Never used  Substance and Sexual Activity   Alcohol use: Yes   Drug use: No   Sexual activity: Not on file  Other Topics Concern   Not on file  Social History Narrative   Her husband died in March 19, 2022 of this year. She has four children.    Social Determinants of Health   Financial Resource Strain: Not on file  Food Insecurity: Not on file  Transportation Needs: Not on file  Physical Activity: Not on file  Stress: Not on file  Social Connections: Not on file     Family  History: The patient's family history includes Breast cancer in her paternal grandfather; CAD in her mother; CAD (age of onset: 50) in her brother; Cancer - Lung in her brother and sister; Colon cancer in her father; Heart attack in her mother; Hypertension in her mother and paternal grandmother.  ROS:   Please see the history of present illness.     All other systems reviewed and are negative.  EKGs/Labs/Other Studies Reviewed:    The following studies were reviewed today:  Echo 11/04/2019  1. Left ventricular ejection fraction, by estimation, is 60 to 65%. The  left ventricle has  normal function. The left ventricle has no regional  wall motion abnormalities. There is moderate asymmetric left ventricular  hypertrophy of the basal-septal  segment. Left ventricular diastolic parameters are consistent with Grade I  diastolic dysfunction (impaired relaxation). Elevated left atrial  pressure.   2. Right ventricular systolic function is normal. The right ventricular  size is normal.   3. The mitral valve is normal in structure. Mild mitral valve  regurgitation. No evidence of mitral stenosis.   4. The aortic valve is normal in structure. There is mild calcification  of the aortic valve. There is mild thickening of the aortic valve. Aortic  valve regurgitation is mild. Mild to moderate aortic valve  sclerosis/calcification is present, without any  evidence of aortic stenosis.   5. The inferior vena cava is normal in size with greater than 50%  respiratory variability, suggesting right atrial pressure of 3 mmHg.    EKG:  EKG is ordered today.  The ekg ordered today demonstrates normal sinus rhythm, no significant ST-T wave changes.  Recent Labs: No results found for requested labs within last 365 days.  Recent Lipid Panel    Component Value Date/Time   CHOL 123 11/18/2018 0956   TRIG 108 11/18/2018 0956   HDL 56 11/18/2018 0956   CHOLHDL 2.2 11/18/2018 0956   CHOLHDL 2.3 12/02/2013  0902   VLDL 24 12/02/2013 0902   LDLCALC 47 11/18/2018 0956     Risk Assessment/Calculations:           Physical Exam:    VS:  BP 138/78 (BP Location: Left Arm, Patient Position: Sitting, Cuff Size: Normal)   Pulse 70   Ht 5\' 3"  (1.6 m)   Wt 140 lb 4.8 oz (63.6 kg)   SpO2 97%   BMI 24.85 kg/m        Wt Readings from Last 3 Encounters:  06/16/22 140 lb 4.8 oz (63.6 kg)  03/08/21 149 lb 14.6 oz (68 kg)  03/04/21 149 lb 14.6 oz (68 kg)     GEN:  Well nourished, well developed in no acute distress HEENT: Normal NECK: No JVD; No carotid bruits LYMPHATICS: No lymphadenopathy CARDIAC: RRR, no murmurs, rubs, gallops RESPIRATORY:  Clear to auscultation without rales, wheezing or rhonchi  ABDOMEN: Soft, non-tender, non-distended MUSCULOSKELETAL:  No edema; No deformity  SKIN: Warm and dry NEUROLOGIC:  Alert and oriented x 3 PSYCHIATRIC:  Normal affect   ASSESSMENT:    1. Preop examination   2. Coronary artery disease involving native coronary artery of native heart without angina pectoris   3. Essential hypertension   4. Hyperlipidemia LDL goal <70    PLAN:    In order of problems listed above:  Preoperative clearance: Patient has great functional ability.  She just returned from a trip to [facility] and that was walking several miles a day without any exertional symptoms.  Her previous angina was jaw pain which she has not experienced recently.  She is at acceptable risk to proceed from the cardiac perspective.  Ideally, she should continue on the aspirin through the procedure due to cardiac history, however if absolutely needed, she may hold aspirin for 5 days prior to the procedure and restart as soon as possible afterward at the surgeon's discretion  CAD: Prior PCI of LAD in 2014.  Negative POET in 2017.  On aspirin and statin  Hypertension: Continue on current therapy  Hyperlipidemia: On Lipitor 80 mg daily.           Medication Adjustments/Labs  and Tests  Ordered: Current medicines are reviewed at length with the patient today.  Concerns regarding medicines are outlined above.  Orders Placed This Encounter  Procedures   EKG 12-Lead   No orders of the defined types were placed in this encounter.   Patient Instructions  Medication Instructions:   Your physician recommends that you continue on your current medications as directed. Please refer to the Current Medication list given to you today.  *If you need a refill on your cardiac medications before your next appointment, please call your pharmacy*  Lab Work: NONE ordered at this time of appointment   If you have labs (blood work) drawn today and your tests are completely normal, you will receive your results only by: MyChart Message (if you have MyChart) OR A paper copy in the mail If you have any lab test that is abnormal or we need to change your treatment, we will call you to review the results.  Testing/Procedures: NONE ordered at this time of appointment   Follow-Up: At [facility], you and your health needs are our priority.  As part of our continuing mission to provide you with exceptional heart care, we have created designated Provider Care Teams.  These Care Teams include your primary Cardiologist (physician) and Advanced Practice Providers (APPs -  Physician Assistants and Nurse Practitioners) who all work together to provide you with the care you need, when you need it.  Your next appointment:   8-9 month(s)  Provider:   Rollene Rotunda, MD     Other Instructions     Signed, Azalee Course, PA  06/18/2022 12:47 PM    [facility]

## 2022-06-16 NOTE — Patient Instructions (Signed)
Medication Instructions:   Your physician recommends that you continue on your current medications as directed. Please refer to the Current Medication list given to you today.  *If you need a refill on your cardiac medications before your next appointment, please call your pharmacy*  Lab Work: NONE ordered at this time of appointment   If you have labs (blood work) drawn today and your tests are completely normal, you will receive your results only by: MyChart Message (if you have MyChart) OR A paper copy in the mail If you have any lab test that is abnormal or we need to change your treatment, we will call you to review the results.  Testing/Procedures: NONE ordered at this time of appointment   Follow-Up: At [facility], you and your health needs are our priority.  As part of our continuing mission to provide you with exceptional heart care, we have created designated Provider Care Teams.  These Care Teams include your primary Cardiologist (physician) and Advanced Practice Providers (APPs -  Physician Assistants and Nurse Practitioners) who all work together to provide you with the care you need, when you need it.  Your next appointment:   8-9 month(s)  Provider:   Rollene Rotunda, MD     Other Instructions

## 2022-06-18 ENCOUNTER — Encounter: Payer: Self-pay | Admitting: Physician Assistant

## 2022-06-18 NOTE — Telephone Encounter (Signed)
Please see preoperative clearance note from 06/16/2022, patient was cleared to proceed with upcoming surgery from the cardiac perspective.  I have forwarded my office note to [facility] surgery.

## 2022-06-21 DIAGNOSIS — D1801 Hemangioma of skin and subcutaneous tissue: Secondary | ICD-10-CM | POA: Diagnosis not present

## 2022-06-21 DIAGNOSIS — L821 Other seborrheic keratosis: Secondary | ICD-10-CM | POA: Diagnosis not present

## 2022-06-21 DIAGNOSIS — Z85828 Personal history of other malignant neoplasm of skin: Secondary | ICD-10-CM | POA: Diagnosis not present

## 2022-06-29 SURGERY — Surgical Case
Anesthesia: *Unknown

## 2022-06-30 DIAGNOSIS — S40862A Insect bite (nonvenomous) of left upper arm, initial encounter: Secondary | ICD-10-CM | POA: Diagnosis not present

## 2022-07-18 DIAGNOSIS — R339 Retention of urine, unspecified: Secondary | ICD-10-CM | POA: Diagnosis not present

## 2022-07-27 DIAGNOSIS — N3020 Other chronic cystitis without hematuria: Secondary | ICD-10-CM | POA: Diagnosis not present

## 2022-07-27 DIAGNOSIS — N312 Flaccid neuropathic bladder, not elsewhere classified: Secondary | ICD-10-CM | POA: Diagnosis not present

## 2022-07-27 DIAGNOSIS — N8111 Cystocele, midline: Secondary | ICD-10-CM | POA: Diagnosis not present

## 2022-08-08 DIAGNOSIS — R350 Frequency of micturition: Secondary | ICD-10-CM | POA: Diagnosis not present

## 2022-08-08 DIAGNOSIS — N3000 Acute cystitis without hematuria: Secondary | ICD-10-CM | POA: Diagnosis not present

## 2022-08-10 NOTE — Patient Instructions (Addendum)
SURGICAL WAITING ROOM VISITATION Patients having surgery or a procedure may have no more than 2 support people in the waiting area - these visitors may rotate.    Children under the age of 34 must have an adult with them who is not the patient.  If the patient needs to stay at the hospital during part of their recovery, the visitor guidelines for inpatient rooms apply. Pre-op nurse will coordinate an appropriate time for 1 support person to accompany patient in pre-op.  This support person may not rotate.    Please refer to the [facility] website for the visitor guidelines for Inpatients (after your surgery is over and you are in a regular room).       Your procedure is scheduled on: 08-17-22   Report to [facility] Main Entrance    Report to admitting at 5:15 AM   Call this number if you have problems the morning of surgery [phone]   Do not eat food :After Midnight.   After Midnight you may have the following liquids until 4:30 AM DAY OF SURGERY  Water Non-Citrus Juices (without pulp, NO RED-Apple, White grape, White cranberry) Black Coffee (NO MILK/CREAM OR CREAMERS, sugar ok)  Clear Tea (NO MILK/CREAM OR CREAMERS, sugar ok) regular and decaf                             Plain Jell-O (NO RED)                                           Fruit ices (not with fruit pulp, NO RED)                                     Popsicles (NO RED)                                                               Sports drinks like Gatorade (NO RED)                       If you have questions, please contact your surgeon's office.   FOLLOW  ANY ADDITIONAL PRE OP INSTRUCTIONS YOU RECEIVED FROM YOUR SURGEON'S OFFICE!!!     Oral Hygiene is also important to reduce your risk of infection.                                    Remember - BRUSH YOUR TEETH THE MORNING OF SURGERY WITH YOUR REGULAR TOOTHPASTE   Do NOT smoke after Midnight   Take these medicines the morning of surgery with A SIP  OF WATER:   Atorvastatin  Cipro if needed                              You may not have any metal on your body including hair pins, jewelry, and body piercing  Do not wear make-up, lotions, powders, perfumes, or deodorant  Do not wear nail polish including gel and S&S, artificial/acrylic nails, or any other type of covering on natural nails including finger and toenails. If you have artificial nails, gel coating, etc. that needs to be removed by a nail salon please have this removed prior to surgery or surgery may need to be canceled/ delayed if the surgeon/ anesthesia feels like they are unable to be safely monitored.   Do not shave  48 hours prior to surgery.    Do not bring valuables to the hospital. [facility] IS NOT RESPONSIBLE   FOR VALUABLES.   Contacts, dentures or bridgework may not be worn into surgery.  DO NOT BRING YOUR HOME MEDICATIONS TO THE HOSPITAL. PHARMACY WILL DISPENSE MEDICATIONS LISTED ON YOUR MEDICATION LIST TO YOU DURING YOUR ADMISSION IN THE HOSPITAL!    Patients discharged on the day of surgery will not be allowed to drive home.  Someone NEEDS to stay with you for the first 24 hours after anesthesia.   Special Instructions: Bring a copy of your healthcare power of attorney and living will documents the day of surgery if you haven't scanned them before.              Please read over the following fact sheets you were given: IF YOU HAVE QUESTIONS ABOUT YOUR PRE-OP INSTRUCTIONS PLEASE CALL [phone] [place]  If you received a COVID test during your pre-op visit  it is requested that you wear a mask when out in public, stay away from anyone that may not be feeling well and notify your surgeon if you develop symptoms. If you test positive for Covid or have been in contact with anyone that has tested positive in the last 10 days please notify you surgeon.  [facility] - Preparing for Surgery Before surgery, you can play an important role.  Because skin is  not sterile, your skin needs to be as free of germs as possible.  You can reduce the number of germs on your skin by washing with CHG (chlorahexidine gluconate) soap before surgery.  CHG is an antiseptic cleaner which kills germs and bonds with the skin to continue killing germs even after washing. Please DO NOT use if you have an allergy to CHG or antibacterial soaps.  If your skin becomes reddened/irritated stop using the CHG and inform your nurse when you arrive at Short Stay. Do not shave (including legs and underarms) for at least 48 hours prior to the first CHG shower.  You may shave your face/neck.  Please follow these instructions carefully:  1.  Shower with CHG Soap the night before surgery and the  morning of surgery.  2.  If you choose to wash your hair, wash your hair first as usual with your normal  shampoo.  3.  After you shampoo, rinse your hair and body thoroughly to remove the shampoo.                             4.  Use CHG as you would any other liquid soap.  You can apply chg directly to the skin and wash.  Gently with a scrungie or clean washcloth.  5.  Apply the CHG Soap to your body ONLY FROM THE NECK DOWN.   Do   not use on face/ open  Wound or open sores. Avoid contact with eyes, ears mouth and   genitals (private parts).                       Wash face,  Genitals (private parts) with your normal soap.             6.  Wash thoroughly, paying special attention to the area where your    surgery  will be performed.  7.  Thoroughly rinse your body with warm water from the neck down.  8.  DO NOT shower/wash with your normal soap after using and rinsing off the CHG Soap.                9.  Pat yourself dry with a clean towel.            10.  Wear clean pajamas.            11.  Place clean sheets on your bed the night of your first shower and do not  sleep with pets. Day of Surgery : Do not apply any lotions/deodorants the morning of surgery.  Please wear  clean clothes to the hospital/surgery center.  FAILURE TO FOLLOW THESE INSTRUCTIONS MAY RESULT IN THE CANCELLATION OF YOUR SURGERY  PATIENT SIGNATURE_________________________________  NURSE SIGNATURE__________________________________  ________________________________________________________________________

## 2022-08-10 NOTE — Progress Notes (Addendum)
COVID Vaccine Completed:  Yes  Date of COVID positive in last 90 days:  No  PCP - Laurann Montana, MD Cardiologist - Rollene Rotunda, MD  Cardiac clearance in Epic dated 06-16-22 by Azalee Course, PA  Chest x-ray - N/A EKG - 06-16-22 Epic Stress Test - 07-19-15 Epic ECHO - 11-04-19 Epic Cardiac Cath - 2014 Pacemaker/ICD device last checked: Spinal Cord Stimulator:  N/A  Bowel Prep - N/A  Sleep Study - N/A CPAP -   Fasting Blood Sugar - N/A Checks Blood Sugar _____ times a day  Last dose of GLP1 agonist-  N/A GLP1 instructions:  N/A   Last dose of SGLT-2 inhibitors-  N/A SGLT-2 instructions: N/A   Blood Thinner Instructions:  Time Aspirin Instructions:  ASA 81.  Per patient she is to stay on Last Dose:  Activity level:  Can go up a flight of stairs and perform activities of daily living without stopping and without symptoms of chest pain or shortness of breath.  Patient lives alone   Anesthesia review:  Hx of MI, CAD, cardiomyopathy, HTN  Patient denies shortness of breath, fever, cough and chest pain at PAT appointment  Patient verbalized understanding of instructions that were given to them at the PAT appointment. Patient was also instructed that they will need to review over the PAT instructions again at home before surgery.

## 2022-08-11 ENCOUNTER — Other Ambulatory Visit (HOSPITAL_COMMUNITY): Payer: PPO

## 2022-08-11 ENCOUNTER — Encounter (HOSPITAL_COMMUNITY)
Admission: RE | Admit: 2022-08-11 | Discharge: 2022-08-11 | Disposition: A | Discharge status: Home / Self Care | Payer: PPO | Source: Ambulatory Visit | Attending: Surgery | Admitting: Surgery

## 2022-08-11 ENCOUNTER — Other Ambulatory Visit: Payer: Self-pay

## 2022-08-11 ENCOUNTER — Encounter (HOSPITAL_COMMUNITY): Payer: Self-pay

## 2022-08-11 VITALS — BP 148/87 | HR 72 | Temp 98.0°F | Resp 16 | Ht 64.5 in | Wt 136.0 lb

## 2022-08-11 DIAGNOSIS — Z01818 Encounter for other preprocedural examination: Secondary | ICD-10-CM | POA: Diagnosis not present

## 2022-08-11 DIAGNOSIS — I10 Essential (primary) hypertension: Secondary | ICD-10-CM | POA: Insufficient documentation

## 2022-08-11 HISTORY — DX: Anemia, unspecified: D64.9

## 2022-08-11 HISTORY — DX: Unspecified osteoarthritis, unspecified site: M19.90

## 2022-08-11 LAB — CBC
HCT: 40.8 % (ref 36.0–46.0)
Hemoglobin: 13.7 g/dL (ref 12.0–15.0)
MCH: 33.1 pg (ref 26.0–34.0)
MCHC: 33.6 g/dL (ref 30.0–36.0)
MCV: 98.6 fL (ref 80.0–100.0)
Platelets: 171 10*3/uL (ref 150–400)
RBC: 4.14 MIL/uL (ref 3.87–5.11)
RDW: 13 % (ref 11.5–15.5)
WBC: 6.2 10*3/uL (ref 4.0–10.5)
nRBC: 0 % (ref 0.0–0.2)

## 2022-08-11 LAB — BASIC METABOLIC PANEL
Anion gap: 6 (ref 5–15)
BUN: 17 mg/dL (ref 8–23)
CO2: 26 mmol/L (ref 22–32)
Calcium: 9.7 mg/dL (ref 8.9–10.3)
Chloride: 104 mmol/L (ref 98–111)
Creatinine, Ser: 0.9 mg/dL (ref 0.44–1.00)
GFR, Estimated: >60 mL/min (ref >60)
Glucose, Bld: 87 mg/dL (ref 70–99)
Potassium: 4.1 mmol/L (ref 3.5–5.1)
Sodium: 136 mmol/L (ref 135–145)

## 2022-08-14 ENCOUNTER — Encounter (HOSPITAL_COMMUNITY): Payer: Self-pay | Admitting: Medical

## 2022-08-14 NOTE — Anesthesia Preprocedure Evaluation (Signed)
Anesthesia Evaluation    Airway        Dental   Pulmonary           Cardiovascular hypertension,      Neuro/Psych    GI/Hepatic   Endo/Other    Renal/GU      Musculoskeletal   Abdominal   Peds  Hematology   Anesthesia Other Findings   Reproductive/Obstetrics                              Anesthesia Physical Anesthesia Plan  ASA:   Anesthesia Plan:    Post-op Pain Management:    Induction:   PONV Risk Score and Plan:   Airway Management Planned:   Additional Equipment:   Intra-op Plan:   Post-operative Plan:   Informed Consent:   Plan Discussed with:   Anesthesia Plan Comments: (See PAT note from 7/12 by Sherlie Ban PA-C )         Anesthesia Quick Evaluation

## 2022-08-14 NOTE — Progress Notes (Signed)
Case: [id number] Date/Time: 08/17/22 0715   Procedure: RIGHT INGUINAL HERNIA REPAIR WITH MESH (Right)   Anesthesia type: General   Pre-op diagnosis: RIGHT INGUINAL HERNIA   Location: [facility] ROOM 01 / [facility]   Surgeons: Manus Rudd, MD       DISCUSSION: Erin Roth is an 89 yo female who presents to PAT prior to surgery above. PMH significant for HTN, hx of NSTEMI (2014), CAD s/p PCI to LAD (2014), mild mitral regurg, hx of ischemic cardiomyopathy, moderate hiatal hernia, recurrent UTIs and self-caths 4 times daily due to incomplete emptying.  Patient follows with Cardiology for hx of MI and CAD. She was seen for cardiac clearance on 06/26/22 and cleared: "Since she has great functional ability, she is at acceptable risk to proceed from the cardiac perspective without further workup."  Follows with PCP for other chronic medical issues. Last seen 7/9 for UTI and treated with Cipro. Followed up for repeat UA on 7/14 to ensure it has resolved.   VS: BP (!) 148/87   Pulse 72   Temp 36.7 C (Oral)   Resp 16   Ht 5' 4.5" (1.638 m)   Wt 61.7 kg   SpO2 98%   BMI 22.98 kg/m   PROVIDERS: PCP - Laurann Montana, MD Cardiologist - Rollene Rotunda, MD   LABS: Labs reviewed: Acceptable for surgery. (all labs ordered are listed, but only abnormal results are displayed)  Labs Reviewed  BASIC METABOLIC PANEL  CBC     IMAGES:  CTA Chest 03/08/21:  IMPRESSION: No evidence of pulmonary embolus.   Prominent pulmonary arteries suggest pulmonary arterial hypertension.   Cardiomegaly, coronary artery disease.   Bibasilar atelectasis.   Free air in the upper abdomen, likely related to recent abdominal/pelvic surgery.   Moderate-sized hiatal hernia.   Aortic Atherosclerosis (ICD10-I70.0).   EKG 06/16/22  NSR, rate 70  CV:  Echo 11/04/19:  IMPRESSIONS     1. Left ventricular ejection fraction, by estimation, is 60 to 65%. The  left ventricle has normal function. The left  ventricle has no regional  wall motion abnormalities. There is moderate asymmetric left ventricular  hypertrophy of the basal-septal  segment. Left ventricular diastolic parameters are consistent with Grade I  diastolic dysfunction (impaired relaxation). Elevated left atrial  pressure.   2. Right ventricular systolic function is normal. The right ventricular  size is normal.   3. The mitral valve is normal in structure. Mild mitral valve  regurgitation. No evidence of mitral stenosis.   4. The aortic valve is normal in structure. There is mild calcification  of the aortic valve. There is mild thickening of the aortic valve. Aortic  valve regurgitation is mild. Mild to moderate aortic valve  sclerosis/calcification is present, without any  evidence of aortic stenosis.   5. The inferior vena cava is normal in size with greater than 50%  respiratory variability, suggesting right atrial pressure of 3 mmHg.   Past Medical History:  Diagnosis Date   Anemia    Anxiety    Arthritis    Bilateral bunions    CAD in native artery    a. NSTEMI 2/14 => LHC 03/08/12: Mid LAD 95% (hazy-suggestive of a ruptured plaque), EF 40%, distal anterior, apical and distal inferior HK =>  PCI: Xience DES to the mid LAD.   Cancer (HCC)    skin cancer   Cardiomyopathy (HCC)    LVEF 45% on cath/echo 03/2012, suspected to be ischemic vs stress-induced   Depression  Diverticulosis    Fracture 2013   Right foot fracture- did not require surgery   Hyperlipidemia    Hypertension    Macular degeneration    Mild mitral regurgitation    a. Echo 03/09/12: Mild focal basal hypertrophy the septum, EF 45-50%, distal anteroseptal, anterolateral, inferolateral, inferoseptal and apical HK, grade 2 diastolic dysfunction, mild MR, PASP 52   Myocardial infarction (HCC)    Osteoporosis    Right knee meniscal tear 07/2016   Self-catheterizes urinary bladder    UTI (lower urinary tract infection)     Past Surgical History:   Procedure Laterality Date   ABDOMINAL HYSTERECTOMY  1980   BACK SURGERY  1986   CARPAL TUNNEL RELEASE Right 05/2013   CATARACT EXTRACTION W/PHACO Left 12/15/2014   Dr. Dagoberto Ligas   CATARACT EXTRACTION W/PHACO Right 02/14/2015   Dr. Dagoberto Ligas   CORONARY ANGIOPLASTY WITH STENT PLACEMENT  03/11/2012   95% mid LAD stenosis s/p DES, minimal nonobs dz elsewhere; LVEF 40%, moderate hypokinesis of distal anterior, apical and distal inferior walls, mildly elevated filling pressures, mild pulmonary HTN   LEFT HEART CATHETERIZATION WITH CORONARY ANGIOGRAM N/A 03/11/2012   Procedure: LEFT HEART CATHETERIZATION WITH CORONARY ANGIOGRAM;  Surgeon: Iran Ouch, MD;  Location: [facility];  Service: Cardiovascular;  Laterality: N/A;   reclast  2013   ROBOTIC ASSISTED LAPAROSCOPIC SACROCOLPOPEXY N/A 03/04/2021   Procedure: XI ROBOTIC ASSISTED LAPAROSCOPIC SACROCOLPOPEXY;  Surgeon: Crist Fat, MD;  Location: [facility];  Service: Urology;  Laterality: N/A;   SKIN CANCER EXCISION      MEDICATIONS:  ALPRAZolam (XANAX) 0.25 MG tablet   aspirin EC 81 MG tablet   atorvastatin (LIPITOR) 80 MG tablet   Cholecalciferol (D 1000) 25 MCG (1000 UT) capsule   ciprofloxacin (CIPRO) 250 MG tablet   lisinopril (ZESTRIL) 5 MG tablet   Multiple Vitamins-Minerals (PRESERVISION AREDS 2+MULTI VIT PO)   nitroGLYCERIN (NITROSTAT) 0.4 MG SL tablet   Probiotic Product (ALIGN) 4 MG CAPS   No current facility-administered medications for this encounter.   Marcille Blanco [facility] Short Stay/Anesthesiology [facility] Phone [phone] 08/14/2022 1:44 PM

## 2022-08-17 DIAGNOSIS — I10 Essential (primary) hypertension: Secondary | ICD-10-CM

## 2022-08-17 DIAGNOSIS — Z01818 Encounter for other preprocedural examination: Secondary | ICD-10-CM

## 2022-08-22 DIAGNOSIS — R339 Retention of urine, unspecified: Secondary | ICD-10-CM | POA: Diagnosis not present

## 2022-08-30 ENCOUNTER — Other Ambulatory Visit: Payer: Self-pay | Admitting: Cardiology

## 2022-08-31 DIAGNOSIS — I10 Essential (primary) hypertension: Secondary | ICD-10-CM | POA: Diagnosis not present

## 2022-08-31 DIAGNOSIS — I2510 Atherosclerotic heart disease of native coronary artery without angina pectoris: Secondary | ICD-10-CM | POA: Diagnosis not present

## 2022-08-31 DIAGNOSIS — M810 Age-related osteoporosis without current pathological fracture: Secondary | ICD-10-CM | POA: Diagnosis not present

## 2022-09-04 DIAGNOSIS — J069 Acute upper respiratory infection, unspecified: Secondary | ICD-10-CM | POA: Diagnosis not present

## 2022-09-12 DIAGNOSIS — I10 Essential (primary) hypertension: Secondary | ICD-10-CM

## 2022-09-12 DIAGNOSIS — Z01818 Encounter for other preprocedural examination: Secondary | ICD-10-CM

## 2022-09-14 DIAGNOSIS — R5383 Other fatigue: Secondary | ICD-10-CM | POA: Diagnosis not present

## 2022-09-14 DIAGNOSIS — U099 Post covid-19 condition, unspecified: Secondary | ICD-10-CM | POA: Diagnosis not present

## 2022-09-15 NOTE — Progress Notes (Signed)
Surgery orders requested via Epic inbox. °

## 2022-09-16 ENCOUNTER — Ambulatory Visit: Payer: Self-pay | Admitting: Surgery

## 2022-09-19 DIAGNOSIS — R339 Retention of urine, unspecified: Secondary | ICD-10-CM | POA: Diagnosis not present

## 2022-09-22 ENCOUNTER — Encounter (HOSPITAL_COMMUNITY)
Admission: RE | Admit: 2022-09-22 | Discharge: 2022-09-22 | Disposition: A | Discharge status: Home / Self Care | Payer: PPO | Source: Ambulatory Visit | Attending: Surgery | Admitting: Surgery

## 2022-09-28 DIAGNOSIS — I10 Essential (primary) hypertension: Secondary | ICD-10-CM

## 2022-09-28 DIAGNOSIS — Z01818 Encounter for other preprocedural examination: Secondary | ICD-10-CM

## 2022-10-16 DIAGNOSIS — H353132 Nonexudative age-related macular degeneration, bilateral, intermediate dry stage: Secondary | ICD-10-CM | POA: Diagnosis not present

## 2022-10-16 DIAGNOSIS — H35722 Serous detachment of retinal pigment epithelium, left eye: Secondary | ICD-10-CM | POA: Diagnosis not present

## 2022-10-16 DIAGNOSIS — H43813 Vitreous degeneration, bilateral: Secondary | ICD-10-CM | POA: Diagnosis not present

## 2022-10-16 DIAGNOSIS — H353222 Exudative age-related macular degeneration, left eye, with inactive choroidal neovascularization: Secondary | ICD-10-CM | POA: Diagnosis not present

## 2022-10-31 DIAGNOSIS — R339 Retention of urine, unspecified: Secondary | ICD-10-CM | POA: Diagnosis not present

## 2022-11-17 ENCOUNTER — Encounter (HOSPITAL_COMMUNITY): Admission: RE | Admit: 2022-11-17 | Payer: PPO | Source: Ambulatory Visit

## 2022-11-23 ENCOUNTER — Encounter (HOSPITAL_COMMUNITY): Admission: RE | Payer: Self-pay | Source: Home / Self Care

## 2022-11-23 ENCOUNTER — Ambulatory Visit (HOSPITAL_COMMUNITY): Admission: RE | Admit: 2022-11-23 | Payer: PPO | Source: Home / Self Care | Admitting: Surgery

## 2022-11-23 DIAGNOSIS — Z01818 Encounter for other preprocedural examination: Secondary | ICD-10-CM

## 2022-11-23 DIAGNOSIS — I10 Essential (primary) hypertension: Secondary | ICD-10-CM

## 2022-11-23 SURGERY — HERNIA REPAIR INGUINAL ADULT
Anesthesia: General | Laterality: Right

## 2022-11-27 DIAGNOSIS — N3000 Acute cystitis without hematuria: Secondary | ICD-10-CM | POA: Diagnosis not present

## 2022-11-27 DIAGNOSIS — R5383 Other fatigue: Secondary | ICD-10-CM | POA: Diagnosis not present

## 2022-11-27 DIAGNOSIS — I517 Cardiomegaly: Secondary | ICD-10-CM | POA: Diagnosis not present

## 2022-11-27 DIAGNOSIS — R0602 Shortness of breath: Secondary | ICD-10-CM | POA: Diagnosis not present

## 2022-11-27 DIAGNOSIS — I771 Stricture of artery: Secondary | ICD-10-CM | POA: Diagnosis not present

## 2022-12-13 DIAGNOSIS — R339 Retention of urine, unspecified: Secondary | ICD-10-CM | POA: Diagnosis not present

## 2023-01-09 NOTE — Progress Notes (Unsigned)
  Cardiology Office Note:   Date:  01/09/2023  ID:  Erin Roth, DOB 1933-07-28, MRN [id number] PCP: Laurann Montana, MD  [facility] Providers Cardiologist:  Rollene Rotunda, MD {  History of Present Illness:   Erin Roth is a 89 y.o. female who presents for evaluation of CAD.  She had PCI of an LAD ruptured plaque in 2014. At that time she presented with jaw pain.  In 2017 she was in the hospital with chest pain.   This was atypical and not felt to be anginal.  She had an echo which showed dynamic outflow obstruction with Valsalva with a peak gradient of 13 mm Hg.   I sent her for a POET (Plain Old Exercise Treadmill) which was negative. She was SOB and I sent her for an echo.  It was essentially unremarkable but there was no Valsalva to evaluate for possible dynamic outflow obstruction.  She had SOB associated with ACE inhibitor.     She presents for follow-up.  She also is preop prior to sacrocolpopexy.  She has been doing well.  She denies any cardiovascular symptoms.  She actually walks on a treadmill routinely.  The patient denies any new symptoms such as chest discomfort, neck or arm discomfort. There has been no new shortness of breath, PND or orthopnea. There have been no reported palpitations, presyncope or syncope.       ROS: ***  Studies Reviewed:    EKG:       ***  Risk Assessment/Calculations:   {Does this patient have ATRIAL FIBRILLATION?:[phone]} No BP recorded.  {Refresh Note OR Click here to enter BP  :1}***        Physical Exam:   VS:  There were no vitals taken for this visit.   Wt Readings from Last 3 Encounters:  08/11/22 136 lb (61.7 kg)  06/16/22 140 lb 4.8 oz (63.6 kg)  03/08/21 149 lb 14.6 oz (68 kg)     GEN: Well nourished, well developed in no acute distress NECK: No JVD; No carotid bruits CARDIAC: ***RR, *** murmurs, rubs, gallops RESPIRATORY:  Clear to auscultation without rales, wheezing or rhonchi  ABDOMEN: Soft, non-tender,  non-distended EXTREMITIES:  No edema; No deformity   ASSESSMENT AND PLAN:   CAD:   The patient has no new sypmtoms.  No further cardiovascular testing is indicated.  We will continue with aggressive risk reduction and meds as listed.   HTN:  The blood pressure is ***   at target.  She requests permission to hold her lisinopril on days when her blood pressure is low and she has been doing this and it works well for her so she will continue with this.[name]    HYPERLIPIDEMIA:      Her LDL is *** 154.  This is up from 60.  She had stopped taking her Lipitor.  She recently restarted this and I will defer to Dr. Cliffton Asters to follow-up with her lipids.  I discussed with her the importance of this.  She agrees to continue.    ***  PREOP: The patient is at acceptable risk for the planned surgery.  She has a high functional status.  She is not going for high risk procedure.  She has no high risk findings or symptoms.  Therefore, according to ACC/AHA guidelines no further testing is indicated.****       Follow up ***  Signed, Rollene Rotunda, MD

## 2023-01-11 ENCOUNTER — Ambulatory Visit: Payer: PPO | Admitting: Cardiology

## 2023-01-11 DIAGNOSIS — I10 Essential (primary) hypertension: Secondary | ICD-10-CM

## 2023-01-11 DIAGNOSIS — E785 Hyperlipidemia, unspecified: Secondary | ICD-10-CM

## 2023-01-11 DIAGNOSIS — I2510 Atherosclerotic heart disease of native coronary artery without angina pectoris: Secondary | ICD-10-CM

## 2023-01-16 DIAGNOSIS — R339 Retention of urine, unspecified: Secondary | ICD-10-CM | POA: Diagnosis not present

## 2023-01-17 DIAGNOSIS — M25562 Pain in left knee: Secondary | ICD-10-CM | POA: Diagnosis not present

## 2023-04-06 DIAGNOSIS — M6281 Muscle weakness (generalized): Secondary | ICD-10-CM | POA: Diagnosis not present

## 2023-04-17 NOTE — Progress Notes (Unsigned)
 Cardiology Office Note:   Date:  04/19/2023  ID:  Erin Roth, DOB 04-10-1933, MRN [id number] PCP: Laurann Montana, MD  Erin Roth Cardiologist:  Rollene Rotunda, MD {  History of Present Illness:   Erin Roth is a 89 y.o. female who presents for evaluation of CAD.  She had PCI of an LAD ruptured plaque in 2014. At that time she presented with jaw pain.  In 2017 she was in the hospital with chest pain.   This was atypical and not felt to be anginal.  She had an echo which showed dynamic outflow obstruction with Valsalva with a peak gradient of 13 mm Hg.   I sent her for a POET (Plain Old Exercise Treadmill) which was negative. She was SOB and I sent her for an echo.  It was essentially unremarkable but there was no Valsalva to evaluate for possible dynamic outflow obstruction.  She had SOB associated with ACE inhibitor.     She presents for follow-up.  She has been doing well.  She is not having any of the shortness of breath that she was having.  She has not had any of the chest discomfort that she had in 2014.  She developed a hernia lifting 40 pound bags of mulch in her yard.  She is post to have surgery for this. The patient denies any new symptoms such as chest discomfort, neck or arm discomfort. There has been no new shortness of breath, PND or orthopnea. There have been no reported palpitations, presyncope or syncope.   ROS: As stated in the HPI and negative for all other systems.  Studies Reviewed:    EKG:   EKG Interpretation Date/Time:  Thursday April 19 2023 10:26:49 EDT Ventricular Rate:  82 PR Interval:  156 QRS Duration:  70 QT Interval:  382 QTC Calculation: 446 R Axis:   -2  Text Interpretation: Normal sinus rhythm Possible Left atrial enlargement When compared with ECG of 08-Mar-2021 19:02, No significant change since last tracing Confirmed by Rollene Rotunda ([id number]) on 04/19/2023 11:09:13 AM    Risk Assessment/Calculations:      Physical Exam:    VS:  BP (!) 140/80 (BP Location: Right Arm, Patient Position: Sitting, Cuff Size: Normal)   Pulse 81   Ht 5\' 5"  (1.651 m)   Wt 141 lb (64 kg)   SpO2 98%   BMI 23.46 kg/m    Wt Readings from Last 3 Encounters:  04/19/23 141 lb (64 kg)  08/11/22 136 lb (61.7 kg)  06/16/22 140 lb 4.8 oz (63.6 kg)     GEN: Well nourished, well developed in no acute distress NECK: No JVD; No carotid bruits CARDIAC: RRR, brief 2 out of 6 apical systolic murmur radiating slightly at aortic outflow tract, no diastolic murmurs, rubs, gallops RESPIRATORY:  Clear to auscultation without rales, wheezing or rhonchi  ABDOMEN: Soft, non-tender, non-distended EXTREMITIES:  No edema; No deformity   ASSESSMENT AND PLAN:   CAD:   The patient has no new sypmtoms.  No further cardiovascular testing is indicated.  We will continue with aggressive risk reduction and meds as listed.   HTN:  The blood pressure is at target.  No change in therapy.   HYPERLIPIDEMIA:      Her LDL is 47 with an HDL of 72.  Appendectomy.     MURMUR: This might be some aortic sclerosis or mild stenosis from the previous echo.  I do not suspect this is any different.  No  change in therapy.  Follow up with me in 1 year  Signed, Rollene Rotunda, MD

## 2023-04-19 ENCOUNTER — Encounter: Payer: Self-pay | Admitting: Cardiology

## 2023-04-19 ENCOUNTER — Ambulatory Visit: Payer: PPO | Attending: Cardiology | Admitting: Cardiology

## 2023-04-19 VITALS — BP 140/80 | HR 81 | Ht 65.0 in | Wt 141.0 lb

## 2023-04-19 DIAGNOSIS — H353132 Nonexudative age-related macular degeneration, bilateral, intermediate dry stage: Secondary | ICD-10-CM | POA: Diagnosis not present

## 2023-04-19 DIAGNOSIS — E785 Hyperlipidemia, unspecified: Secondary | ICD-10-CM | POA: Diagnosis not present

## 2023-04-19 DIAGNOSIS — H43813 Vitreous degeneration, bilateral: Secondary | ICD-10-CM | POA: Diagnosis not present

## 2023-04-19 DIAGNOSIS — H35722 Serous detachment of retinal pigment epithelium, left eye: Secondary | ICD-10-CM | POA: Diagnosis not present

## 2023-04-19 DIAGNOSIS — I10 Essential (primary) hypertension: Secondary | ICD-10-CM

## 2023-04-19 DIAGNOSIS — I2510 Atherosclerotic heart disease of native coronary artery without angina pectoris: Secondary | ICD-10-CM | POA: Diagnosis not present

## 2023-04-19 DIAGNOSIS — H26491 Other secondary cataract, right eye: Secondary | ICD-10-CM | POA: Diagnosis not present

## 2023-04-19 DIAGNOSIS — H353221 Exudative age-related macular degeneration, left eye, with active choroidal neovascularization: Secondary | ICD-10-CM | POA: Diagnosis not present

## 2023-04-19 MED ORDER — ATORVASTATIN CALCIUM 80 MG PO TABS: 80.0000 mg | ORAL_TABLET | Freq: Every day | ORAL | 3 refills | Status: AC

## 2023-04-19 MED ORDER — NITROGLYCERIN 0.4 MG SL SUBL: SUBLINGUAL_TABLET | SUBLINGUAL | 99 refills | Status: AC

## 2023-04-19 MED ORDER — LISINOPRIL 5 MG PO TABS: 5.0000 mg | ORAL_TABLET | Freq: Every day | ORAL | 3 refills | Status: AC | PRN

## 2023-04-19 NOTE — Patient Instructions (Signed)

## 2023-04-27 DIAGNOSIS — R339 Retention of urine, unspecified: Secondary | ICD-10-CM | POA: Diagnosis not present

## 2023-05-24 DIAGNOSIS — H353132 Nonexudative age-related macular degeneration, bilateral, intermediate dry stage: Secondary | ICD-10-CM | POA: Diagnosis not present

## 2023-05-24 DIAGNOSIS — H02836 Dermatochalasis of left eye, unspecified eyelid: Secondary | ICD-10-CM | POA: Diagnosis not present

## 2023-05-24 DIAGNOSIS — H353221 Exudative age-related macular degeneration, left eye, with active choroidal neovascularization: Secondary | ICD-10-CM | POA: Diagnosis not present

## 2023-05-24 DIAGNOSIS — H02833 Dermatochalasis of right eye, unspecified eyelid: Secondary | ICD-10-CM | POA: Diagnosis not present

## 2023-05-24 DIAGNOSIS — H35722 Serous detachment of retinal pigment epithelium, left eye: Secondary | ICD-10-CM | POA: Diagnosis not present

## 2023-05-24 DIAGNOSIS — H26491 Other secondary cataract, right eye: Secondary | ICD-10-CM | POA: Diagnosis not present

## 2023-05-24 DIAGNOSIS — R339 Retention of urine, unspecified: Secondary | ICD-10-CM | POA: Diagnosis not present

## 2023-05-24 DIAGNOSIS — H43813 Vitreous degeneration, bilateral: Secondary | ICD-10-CM | POA: Diagnosis not present

## 2023-06-21 DIAGNOSIS — I10 Essential (primary) hypertension: Secondary | ICD-10-CM | POA: Diagnosis not present

## 2023-06-21 DIAGNOSIS — I2510 Atherosclerotic heart disease of native coronary artery without angina pectoris: Secondary | ICD-10-CM | POA: Diagnosis not present

## 2023-06-21 DIAGNOSIS — E559 Vitamin D deficiency, unspecified: Secondary | ICD-10-CM | POA: Diagnosis not present

## 2023-06-21 DIAGNOSIS — M810 Age-related osteoporosis without current pathological fracture: Secondary | ICD-10-CM | POA: Diagnosis not present

## 2023-06-26 DIAGNOSIS — R339 Retention of urine, unspecified: Secondary | ICD-10-CM | POA: Diagnosis not present

## 2023-06-28 DIAGNOSIS — I10 Essential (primary) hypertension: Secondary | ICD-10-CM | POA: Diagnosis not present

## 2023-06-28 DIAGNOSIS — N390 Urinary tract infection, site not specified: Secondary | ICD-10-CM | POA: Diagnosis not present

## 2023-06-29 DIAGNOSIS — Z85828 Personal history of other malignant neoplasm of skin: Secondary | ICD-10-CM | POA: Diagnosis not present

## 2023-06-29 DIAGNOSIS — L814 Other melanin hyperpigmentation: Secondary | ICD-10-CM | POA: Diagnosis not present

## 2023-06-29 DIAGNOSIS — L570 Actinic keratosis: Secondary | ICD-10-CM | POA: Diagnosis not present

## 2023-06-29 DIAGNOSIS — D1801 Hemangioma of skin and subcutaneous tissue: Secondary | ICD-10-CM | POA: Diagnosis not present

## 2023-06-29 DIAGNOSIS — I8391 Asymptomatic varicose veins of right lower extremity: Secondary | ICD-10-CM | POA: Diagnosis not present

## 2023-06-29 DIAGNOSIS — L218 Other seborrheic dermatitis: Secondary | ICD-10-CM | POA: Diagnosis not present

## 2023-06-29 DIAGNOSIS — D692 Other nonthrombocytopenic purpura: Secondary | ICD-10-CM | POA: Diagnosis not present

## 2023-06-29 DIAGNOSIS — I8392 Asymptomatic varicose veins of left lower extremity: Secondary | ICD-10-CM | POA: Diagnosis not present

## 2023-06-29 DIAGNOSIS — L821 Other seborrheic keratosis: Secondary | ICD-10-CM | POA: Diagnosis not present

## 2023-07-05 DIAGNOSIS — H43813 Vitreous degeneration, bilateral: Secondary | ICD-10-CM | POA: Diagnosis not present

## 2023-07-05 DIAGNOSIS — H353132 Nonexudative age-related macular degeneration, bilateral, intermediate dry stage: Secondary | ICD-10-CM | POA: Diagnosis not present

## 2023-07-05 DIAGNOSIS — H35722 Serous detachment of retinal pigment epithelium, left eye: Secondary | ICD-10-CM | POA: Diagnosis not present

## 2023-07-05 DIAGNOSIS — H353221 Exudative age-related macular degeneration, left eye, with active choroidal neovascularization: Secondary | ICD-10-CM | POA: Diagnosis not present

## 2023-07-05 DIAGNOSIS — H26491 Other secondary cataract, right eye: Secondary | ICD-10-CM | POA: Diagnosis not present

## 2023-07-06 ENCOUNTER — Telehealth: Payer: Self-pay | Admitting: *Deleted

## 2023-07-06 ENCOUNTER — Ambulatory Visit: Payer: Self-pay | Admitting: Surgery

## 2023-07-06 DIAGNOSIS — K4090 Unilateral inguinal hernia, without obstruction or gangrene, not specified as recurrent: Secondary | ICD-10-CM | POA: Diagnosis not present

## 2023-07-06 NOTE — Telephone Encounter (Signed)
 Left message for pt to call our office and ask for the preop team to schedule TELE Preop Appt.

## 2023-07-06 NOTE — H&P (Signed)
 Subjective   Chief Complaint: Follow-up   Cardiology - Hochrein PCP - Victorio Grave Ref - Rannie Byars Turmel  History of Present Illness: Erin Roth is a 89 y.o. female who is seen today for hernia follow-up  This is an 89 year old female who is fairly active who presents with recent onset of a right inguinal bulge.  She works in her garden frequently and noticed an uncomfortable bulge in her right groin.  This remains reducible.  She denies any GI obstructive symptoms.  Upon review of her records, the patient had a robotic laparoscopic sacrocolpopexy by Dr. Dulcy Gibney.  She did very well with that surgery and was discharged after 1 night in the hospital.  Prior to that surgery, in November 2022, she had a CT scan that incidentally noted a right inguinal hernia containing only fat.  She also had a small umbilical hernia.  She was initially examined in April 2024.  We recommended right inguinal hernia repair with mesh.  However, the patient developed COVID and has had multiple medical issues over the last year.  Her health status is stable at this time and she presents now to discuss right inguinal hernia repair.  She has no symptoms at her umbilicus.  She denies any obstructive symptoms.  The hernia remains reducible although occasionally uncomfortable.  She recently saw her cardiologist.  She is on a baby aspirin  daily.  Review of Systems: A complete review of systems was obtained from the patient.  I have reviewed this information and discussed as appropriate with the patient.  See HPI as well for other ROS.  Review of Systems  Constitutional: Negative.   HENT:  Positive for hearing loss.   Eyes: Negative.   Respiratory: Negative.    Cardiovascular: Negative.   Gastrointestinal:  Positive for abdominal pain.  Genitourinary: Negative.   Musculoskeletal: Negative.   Skin: Negative.   Neurological: Negative.   Endo/Heme/Allergies:  Bruises/bleeds easily.  Psychiatric/Behavioral: Negative.         Medical History: Past Medical History:  Diagnosis Date   Arthritis    Hypertension     Patient Active Problem List  Diagnosis   CAD (coronary artery disease), native coronary artery   Anxiety   Cardiomyopathy (CMS/HHS-HCC)   Cystocele with prolapse   Dyslipidemia   Essential hypertension   Intermediate stage nonexudative age-related macular degeneration of both eyes   History of MI (myocardial infarction)   Hyperlipidemia   Mild pulmonary hypertension (CMS/HHS-HCC)   Mild mitral regurgitation   NSTEMI (non-ST elevated myocardial infarction) (CMS/HHS-HCC)   Right inguinal hernia    Past Surgical History:  Procedure Laterality Date   Back surgery       Allergies  Allergen Reactions   Venom-Honey Bee Anaphylaxis   Estrogens Unknown    Breast pain   Others Other (See Comments)    Uncoded Allergy. Allergen: BEE-STINGS, Other Reaction: EXTREME SWELLING   Prednisone Itching   Sulfa (Sulfonamide Antibiotics) Rash   Escitalopram Rash and Swelling    edema    Current Outpatient Medications on File Prior to Visit  Medication Sig Dispense Refill   *atenolol oral      atorvastatin  (LIPITOR ) 80 MG tablet 1 tablet Orally Once a day for 90 days     cholecalciferol (VITAMIN D3) 1000 unit capsule Take 1 capsule by mouth once daily     hydrocodone -acetaminophen  (NORCO) 5-325 mg tablet 1-2 tabs po every 4-6 hours as needed for severe pain (will cause drowsiness).     lisinopriL  (ZESTRIL ) 5 MG  tablet 1 tablet as needed Orally Once a day     nitrofurantoin , macrocrystal-monohydrate, (MACROBID ) 100 MG capsule      nitroGLYcerin  (NITROSTAT ) 0.4 MG SL tablet 1 TABLET UNDER THE TONGUE EVERY 5 MINUTES FOR UP TO 3 DOSES AS NEEDEDFOR CHEST PAIN.     No current facility-administered medications on file prior to visit.    History reviewed. No pertinent family history.   Social History   Tobacco Use  Smoking Status Never  Smokeless Tobacco Never     Social History    Socioeconomic History   Marital status: Married  Tobacco Use   Smoking status: Never   Smokeless tobacco: Never  Substance and Sexual Activity   Alcohol use: Never   Drug use: Never   Social Drivers of Health   Housing Stability: Unknown (07/06/2023)   Housing Stability Vital Sign    Homeless in the Last Year: No    Objective:    Vitals:   07/06/23 0945  PainSc: 0-No pain     Physical Exam   Constitutional:  WDWN in NAD, conversant, no obvious deformities; lying in bed comfortably Eyes:  Pupils equal, round; sclera anicteric; moist conjunctiva; no lid lag HENT:  Oral mucosa moist; good dentition  Neck:  No masses palpated, trachea midline; no thyromegaly Lungs:  CTA bilaterally; normal respiratory effort CV:  Regular rate and rhythm; no murmurs; extremities well-perfused with no edema Abd:  +bowel sounds, soft, non-tender, no palpable organomegaly; no palpable umbilical hernia The right groin shows a visible palpable inguinal hernia that is reducible.  No sign of left inguinal hernia Musc: Normal gait; no apparent clubbing or cyanosis in extremities Lymphatic:  No palpable cervical or axillary lymphadenopathy Skin:  Warm, dry; no sign of jaundice Psychiatric - alert and oriented x 4; calm mood and affect   Labs, Imaging and Diagnostic Testing: CLINICAL DATA:  Left flank pain, nausea, vomiting and chills.   EXAM: CT ABDOMEN AND PELVIS WITH CONTRAST   TECHNIQUE: Multidetector CT imaging of the abdomen and pelvis was performed using the standard protocol following bolus administration of intravenous contrast.   CONTRAST:  80mL OMNIPAQUE  IOHEXOL  350 MG/ML SOLN   COMPARISON:  CT with IV contrast 09/20/2015   FINDINGS: Lower chest: There are scattered linear scar-like opacities. Mild cardiomegaly without pericardial effusion. Moderate-sized hiatal hernia increased in size since prior study.   Hepatobiliary: 18 cm in length liver, mildly steatotic with  multiple scattered cysts, largest is septated in the posterior segment of the right hepatic lobe measuring 2.4 cm, previously 1.6 cm. There is no mass enhancement , biliary ductal dilatation or focal gallbladder abnormality.   Pancreas: Unremarkable.   Spleen: Unremarkable.   Adrenals/Urinary Tract: There is no adrenal mass. Small chronic calcification noted in the lateral limb right adrenal gland. There are few small right renal hypodensities which are too small to characterize. There is no hydronephrosis on the right, no intrarenal stone either side.   On the left, there is increased moderate to severe hydronephrosis to the UPJ, without visible UPJ or ureteral stone or ureteral dilatation. Multiple left renal cysts are again noted and there is increased left perinephric stranding and fluid, and asymmetric left renal cortical contrast retention the latter compatible with obstructive uropathy. The bladder wall and lumen are unremarkable.   Stomach/Bowel: Moderate-sized hiatal hernia. The gastric wall and unopacified small bowel are unremarkable. There is a normal caliber appendix. No evidence of colitis or diverticulitis. Mild-to-moderate fecal stasis.   Vascular/Lymphatic: Aortic atherosclerosis. No enlarged  abdominal or pelvic lymph nodes.   Reproductive: The uterus is absent.  No adnexal mass is seen.   Other: There is left perinephric fluid but no other free fluid is seen. There is no free air, hemorrhage or abscess.   Musculoskeletal: Small umbilical and right inguinal fat hernias. Multiple stable pelvic phleboliths. There is osteopenia, S shaped thoracolumbar scoliosis and advanced degenerative change of the lumbar spine with multilevel spinous process abutment. There are chronic compression fractures of T12, L1 and L2.   IMPRESSION: 1. Increased moderate to severe left hydronephrosis to the UPJ with cortical contrast retention consistent with obstructive  uropathy, and increased perinephric stranding and fluid which could be due to a caliceal leak, obstructive uropathy or infectious process. There is no appreciable stone. Findings could be due to a recently passed stone, UPJ stenosis or stricture. Urology consult recommended. 2. Small scattered liver cysts with mild hepatic steatosis. 3. Constipation, scattered diverticulosis. 4. Osteopenia, scoliosis, degenerative changes and chronic compression fractures. 5. Moderate-sized hiatal hernia increased from 2017.     Electronically Signed   By: Denman Fischer M.D.   On: 12/27/2020 21:52    Assessment and Plan:  Diagnoses and all orders for this visit:  Right inguinal hernia    Since the hernia is becoming more symptomatic, would recommend right inguinal hernia repair with mesh.The surgical procedure has been discussed with the patient.  Potential risks, benefits, alternative treatments, and expected outcomes have been explained.  All of the patient's questions at this time have been answered.  The likelihood of reaching the patient's treatment goal is good.  The patient understand the proposed surgical procedure and wishes to proceed.   We will obtain cardiac clearance prior to scheduling surgery.  We will plan to keep the patient overnight for observation after her surgery.  We will perform her surgery at [facility].  Naw Lasala Jannelle Memory, MD  07/06/2023 10:57 AM

## 2023-07-06 NOTE — Telephone Encounter (Signed)
   Pre-operative Risk Assessment    Patient Name: Erin Roth  DOB: 1933-10-25 MRN: [id number]   Date of last office visit: 04/19/23 DR. HOCHREIN Date of next office visit: NONE   Request for Surgical Clearance    Procedure:  RIGHT INGUINAL HERNIA REPAIR  Date of Surgery:  Clearance TBD                                Surgeon:  DR. MATTHEW TSUEI Surgeon's Group or Practice Name:  [facility] Phone number:  [phone] Fax number:  [phone] [name], [name]   Type of Clearance Requested:   - Medical  - Pharmacy:  Hold Aspirin      Type of Anesthesia:  General    Additional requests/questions:    [name]   07/06/2023, 12:10 PM

## 2023-07-06 NOTE — Telephone Encounter (Signed)
   Name: Erin Roth  DOB: 11-19-1933  MRN: [id number]  Primary Cardiologist: Eilleen Grates, MD   Preoperative team, please contact this patient and set up a phone call appointment for further preoperative risk assessment. Please obtain consent and complete medication review. Thank you for your help.  I confirm that guidance regarding antiplatelet and oral anticoagulation therapy has been completed and, if necessary, noted below.  Regarding ASA therapy, we recommend continuation of ASA throughout the perioperative period.  However, if the surgeon feels that cessation of ASA is required in the perioperative period, it may be stopped 5-7 days prior to surgery with a plan to resume it as soon as felt to be feasible from a surgical standpoint in the post-operative period.   I also confirmed the patient resides in the state of [place] . As per [facility] telemedicine laws, the patient must reside in the state in which the provider is licensed.   Jude Norton, NP 07/06/2023, 12:18 PM [facility]

## 2023-07-09 ENCOUNTER — Telehealth: Payer: Self-pay

## 2023-07-09 NOTE — Telephone Encounter (Signed)
 Spoke with patient and she is scheduled for 07/16/23 with Morey Ar, NP. Med rec and consent done.     Patient Consent for Virtual Visit        Erin Roth has provided verbal consent on 07/09/2023 for a virtual visit (video or telephone).   CONSENT FOR VIRTUAL VISIT FOR:  Erin Roth  By participating in this virtual visit I agree to the following:  I hereby voluntarily request, consent and authorize [facility] and its employed or contracted physicians, physician assistants, nurse practitioners or other licensed health care professionals (the Practitioner), to provide me with telemedicine health care services (the "Services") as deemed necessary by the treating Practitioner. I acknowledge and consent to receive the Services by the Practitioner via telemedicine. I understand that the telemedicine visit will involve communicating with the Practitioner through live audiovisual communication technology and the disclosure of certain medical information by electronic transmission. I acknowledge that I have been given the opportunity to request an in-person assessment or other available alternative prior to the telemedicine visit and am voluntarily participating in the telemedicine visit.  I understand that I have the right to withhold or withdraw my consent to the use of telemedicine in the course of my care at any time, without affecting my right to future care or treatment, and that the Practitioner or I may terminate the telemedicine visit at any time. I understand that I have the right to inspect all information obtained and/or recorded in the course of the telemedicine visit and may receive copies of available information for a reasonable fee.  I understand that some of the potential risks of receiving the Services via telemedicine include:  Delay or interruption in medical evaluation due to technological equipment failure or disruption; Information transmitted may not be  sufficient (e.g. poor resolution of images) to allow for appropriate medical decision making by the Practitioner; and/or  In rare instances, security protocols could fail, causing a breach of personal health information.  Furthermore, I acknowledge that it is my responsibility to provide information about my medical history, conditions and care that is complete and accurate to the best of my ability. I acknowledge that Practitioner's advice, recommendations, and/or decision may be based on factors not within their control, such as incomplete or inaccurate data provided by me or distortions of diagnostic images or specimens that may result from electronic transmissions. I understand that the practice of medicine is not an exact science and that Practitioner makes no warranties or guarantees regarding treatment outcomes. I acknowledge that a copy of this consent can be made available to me via my patient portal [facility] MyChart), or I can request a printed copy by calling the office of [facility].    I understand that my insurance will be billed for this visit.   I have read or had this consent read to me. I understand the contents of this consent, which adequately explains the benefits and risks of the Services being provided via telemedicine.  I have been provided ample opportunity to ask questions regarding this consent and the Services and have had my questions answered to my satisfaction. I give my informed consent for the services to be provided through the use of telemedicine in my medical care

## 2023-07-09 NOTE — Telephone Encounter (Signed)
 Spoke with patient and she is scheduled for a tele visit on  07/16/23 with Morey Ar, NP. Med rec and consent done.

## 2023-07-13 DIAGNOSIS — M810 Age-related osteoporosis without current pathological fracture: Secondary | ICD-10-CM | POA: Diagnosis not present

## 2023-07-16 ENCOUNTER — Ambulatory Visit: Attending: Cardiovascular Disease | Admitting: Student

## 2023-07-16 DIAGNOSIS — Z01810 Encounter for preprocedural cardiovascular examination: Secondary | ICD-10-CM | POA: Diagnosis not present

## 2023-07-16 NOTE — Progress Notes (Signed)
 Virtual Visit via Telephone Note   Because of Erin Roth's co-morbid illnesses, she is at least at moderate risk for complications without adequate follow up.  This format is felt to be most appropriate for this patient at this time.  The patient did not have access to video technology/had technical difficulties with video requiring transitioning to audio format only (telephone).  All issues noted in this document were discussed and addressed.  No physical exam could be performed with this format.  Please refer to the patient's chart for her consent to telehealth for [facility].  Evaluation Performed:  Preoperative cardiovascular risk assessment _____________   Date:  07/16/2023   Patient ID:  Erin Roth, DOB [birth year], MRN [id number] Patient Location:  Home Provider location:   Office  Primary Care Provider:  Victorio Grave, MD Primary Cardiologist:  Eilleen Grates, MD  Chief Complaint / Patient Profile   89 y.o. y/o female with a h/o CAD s/p PCI with stent to LAD in 2014, normal exercise stress test June 2017, cardiomyopathy/pulmonary hypertension, hypertension, hyperlipidemia who is pending right inguinal hernia repair by Dr. Eli Grizzle and presents today for telephonic preoperative cardiovascular risk assessment.  History of Present Illness    Erin Roth is a 89 y.o. female who presents via audio/video conferencing for a telehealth visit today.  Pt was last seen in cardiology clinic on 04/19/2023 by Dr. Lavonne Prairie.  At that time Erin Roth was stable from a cardiac standpoint.  The patient is now pending procedure as outlined above. Since her last visit, she is doing well. Patient denies shortness of breath, dyspnea on exertion, lower extremity edema, orthopnea or PND. No chest pain, pressure, or tightness. No palpitations.  She stays active walking and lifting weights daily.   Past Medical History    Past Medical History:  Diagnosis Date   Anemia    Anxiety     Arthritis    Bilateral bunions    CAD in native artery    a. NSTEMI 2/14 => LHC 03/08/12: Mid LAD 95% (hazy-suggestive of a ruptured plaque), EF 40%, distal anterior, apical and distal inferior HK =>  PCI: Xience DES to the mid LAD.   Cancer (HCC)    skin cancer   Cardiomyopathy (HCC)    LVEF 45% on cath/echo 03/2012, suspected to be ischemic vs stress-induced   Depression    Diverticulosis    Fracture 2013   Right foot fracture- did not require surgery   Hyperlipidemia    Hypertension    Macular degeneration    Mild mitral regurgitation    a. Echo 03/09/12: Mild focal basal hypertrophy the septum, EF 45-50%, distal anteroseptal, anterolateral, inferolateral, inferoseptal and apical HK, grade 2 diastolic dysfunction, mild MR, PASP 52   Myocardial infarction (HCC)    Osteoporosis    Right knee meniscal tear 07/2016   Self-catheterizes urinary bladder    UTI (lower urinary tract infection)    Past Surgical History:  Procedure Laterality Date   ABDOMINAL HYSTERECTOMY  1980   BACK SURGERY  1986   CARPAL TUNNEL RELEASE Right 05/2013   CATARACT EXTRACTION W/PHACO Left 12/15/2014   Dr. Matthew Songster   CATARACT EXTRACTION W/PHACO Right 02/14/2015   Dr. Matthew Songster   CORONARY ANGIOPLASTY WITH STENT PLACEMENT  03/11/2012   95% mid LAD stenosis s/p DES, minimal nonobs dz elsewhere; LVEF 40%, moderate hypokinesis of distal anterior, apical and distal inferior walls, mildly elevated filling pressures, mild pulmonary HTN   LEFT HEART CATHETERIZATION WITH  CORONARY ANGIOGRAM N/A 03/11/2012   Procedure: LEFT HEART CATHETERIZATION WITH CORONARY ANGIOGRAM;  Surgeon: Wenona Hamilton, MD;  Location: [facility] CATH LAB;  Service: Cardiovascular;  Laterality: N/A;   reclast   2013   ROBOTIC ASSISTED LAPAROSCOPIC SACROCOLPOPEXY N/A 03/04/2021   Procedure: XI ROBOTIC ASSISTED LAPAROSCOPIC SACROCOLPOPEXY;  Surgeon: Andrez Banker, MD;  Location: [facility] ORS;  Service: Urology;  Laterality: N/A;   SKIN CANCER  EXCISION      Allergies  Allergies  Allergen Reactions   Bee Venom Anaphylaxis   Estrogens     Breast pain   Pneumococcal Vaccines     rash   Lexapro [Escitalopram Oxalate] Rash    edema   Sulfa Antibiotics Rash    Home Medications    Prior to Admission medications   Medication Sig Start Date End Date Taking? Authorizing Provider  ALPRAZolam  (XANAX ) 0.25 MG tablet Take 0.25 mg by mouth at bedtime as needed for anxiety or sleep. Patient not taking: Reported on 07/09/2023    [provider]  aspirin  EC 81 MG tablet Take 81 mg by mouth daily.    [provider]  atorvastatin  (LIPITOR ) 80 MG tablet Take 1 tablet (80 mg total) by mouth daily. 04/19/23   Eilleen Grates, MD  Cholecalciferol (D 1000) 25 MCG (1000 UT) capsule Take 1,000 Units by mouth daily.    [provider]  FORTEO 600 MCG/2.4ML SOPN Inject 20 mcg Subcutaneous once daily for 30 days ok to sub generic teriparatide if insurance prefers Patient not taking: Reported on 07/09/2023 03/10/23   [provider]  lisinopril  (ZESTRIL ) 5 MG tablet Take 1 tablet (5 mg total) by mouth daily as needed (high blood pressure). 04/19/23   Eilleen Grates, MD  Multiple Vitamins-Minerals (PRESERVISION AREDS 2+MULTI VIT PO) Take 1 capsule by mouth daily.    [provider]  nitroGLYCERIN  (NITROSTAT ) 0.4 MG SL tablet DISSOLVE ONE TABLET UNDER THE TONGUE EVERY FIVE MINUTES FOR UP TO 3 DOSES AS NEEDED FOR CHEST PAIN 04/19/23   Eilleen Grates, MD  Omega-3 Fatty Acids (FISH OIL) 1000 MG CAPS 1 capsule every other day.    [provider]  Probiotic Product (ALIGN) 4 MG CAPS Take 4 mg by mouth daily.    [provider]    Physical Exam    Vital Signs:  Erin Roth does not have vital signs available for review today.  Given telephonic nature of communication, physical exam is limited. AAOx3. NAD. Normal affect.  Speech and respirations are unlabored.  Assessment & Plan    Primary  Cardiologist: Eilleen Grates, MD  Preoperative cardiovascular risk assessment.  Right inguinal hernia repair by Dr. Eli Grizzle.  Chart reviewed as part of pre-operative protocol coverage. According to the RCRI, patient has a 6.6-10.1% risk of MACE. Patient reports activity equivalent to 4.0 METS (walking and lifting weights daily).   Given past medical history and time since last visit, based on ACC/AHA guidelines, Erin Roth would be at acceptable risk for the planned procedure without further cardiovascular testing.   Patient was advised that if she develops new symptoms prior to surgery to contact our office to arrange a follow-up appointment.  she verbalized understanding.  Ideally aspirin  should be continued without interruption, however if the bleeding risk is too great, aspirin  may be held for 5-7 days prior to surgery. Please resume aspirin  post operatively when it is felt to be safe from a bleeding standpoint.    I will route this recommendation to the requesting party  via Epic fax function.  Please call with questions.  Time:   Today, I have spent 5 minutes with the patient with telehealth technology discussing medical history, symptoms, and management plan.     Morey Ar, NP  07/16/2023, 9:16 AM

## 2023-07-30 DIAGNOSIS — R339 Retention of urine, unspecified: Secondary | ICD-10-CM | POA: Diagnosis not present

## 2023-08-09 ENCOUNTER — Other Ambulatory Visit: Payer: Self-pay

## 2023-08-09 ENCOUNTER — Encounter (HOSPITAL_COMMUNITY): Payer: Self-pay

## 2023-08-09 ENCOUNTER — Other Ambulatory Visit (HOSPITAL_COMMUNITY): Payer: Self-pay

## 2023-08-09 MED ORDER — EVENITY 105 MG/1.17ML ~~LOC~~ SOSY
210.0000 mg | PREFILLED_SYRINGE | SUBCUTANEOUS | 11 refills | Status: AC
  Filled 2023-08-13 (×2): qty 2.34, 30d supply, fill #0
  Filled 2023-09-05: qty 2.34, 30d supply, fill #1
  Filled 2023-10-05: qty 2.34, 30d supply, fill #2
  Filled 2023-11-07: qty 2.34, 30d supply, fill #3
  Filled 2023-12-12: qty 2.34, 30d supply, fill #4
  Filled 2024-01-10: qty 2.34, 30d supply, fill #5
  Filled 2024-02-08 – 2024-02-11 (×2): qty 2.34, 30d supply, fill #6

## 2023-08-09 NOTE — Progress Notes (Signed)
 Pharmacy Patient Advocate Encounter  Insurance verification completed.   The patient is insured through [facility] ADVANTAGE/RX ADVANCE   Ran test claim for Evenity . Co-pay is $0.  This test claim was processed through [facility]- copay amounts may vary at other pharmacies due to pharmacy/plan contracts, or as the patient moves through the different stages of their insurance plan.

## 2023-08-10 ENCOUNTER — Other Ambulatory Visit (HOSPITAL_COMMUNITY): Payer: Self-pay

## 2023-08-13 ENCOUNTER — Encounter (HOSPITAL_COMMUNITY): Payer: Self-pay

## 2023-08-13 ENCOUNTER — Other Ambulatory Visit (HOSPITAL_COMMUNITY): Payer: Self-pay

## 2023-08-13 ENCOUNTER — Other Ambulatory Visit: Payer: Self-pay

## 2023-08-13 NOTE — Progress Notes (Addendum)
 Specialty Pharmacy Refill Coordination Note  Erin Roth is a 89 y.o. female contacted today regarding refills of specialty medication(s) Romosozumab -aqqg (Evenity )   Patient requested Delivery   Delivery date: 08/14/23   Verified address: [facility] at [place]-[address], [place], [place]   Medication will be filled on 08/13/23.

## 2023-08-13 NOTE — Pre-Procedure Instructions (Signed)
 Surgical Instructions   Your procedure is scheduled on August 23, 2023. Report to [facility] Main Entrance A at 8:30 A.M., then check in with the Admitting office. Any questions or running late day of surgery: call [phone]  Questions prior to your surgery date: call [phone], Monday-Friday, 8am-4pm. If you experience any cold or flu symptoms such as cough, fever, chills, shortness of breath, etc. between now and your scheduled surgery, please notify us  at the above number.     Remember:  Do not eat after midnight the night before your surgery   You may drink clear liquids until 7:30 AM the morning of your surgery.   Clear liquids allowed are: Water , Non-Citrus Juices (without pulp), Carbonated Beverages, Clear Tea (no milk, honey, etc.), Black Coffee Only (NO MILK, CREAM OR POWDERED CREAMER of any kind), and Gatorade.    Take these medicines the morning of surgery with A SIP OF WATER : atorvastatin  (LIPITOR )    May take these medicines IF NEEDED: nitroGLYCERIN  (NITROSTAT ) - if dose taken prior to surgery, please call [phone]   Follow your surgeon's instructions on when to stop Aspirin .  If no instructions were given by your surgeon then you will need to call the office to get those instructions.     One week prior to surgery, STOP taking any Aleve, Naproxen, Ibuprofen, Motrin, Advil, Goody's, BC's, all herbal medications, fish oil, and non-prescription vitamins.                     Do NOT Smoke (Tobacco/Vaping) for 24 hours prior to your procedure.  If you use a CPAP at night, you may bring your mask/headgear for your overnight stay.   You will be asked to remove any contacts, glasses, piercing's, hearing aid's, dentures/partials prior to surgery. Please bring cases for these items if needed.    Patients discharged the day of surgery will not be allowed to drive home, and someone needs to stay with them for 24 hours.  SURGICAL WAITING ROOM VISITATION Patients  may have no more than 2 support people in the waiting area - these visitors may rotate.   Pre-op nurse will coordinate an appropriate time for 1 ADULT support person, who may not rotate, to accompany patient in pre-op.  Children under the age of 39 must have an adult with them who is not the patient and must remain in the main waiting area with an adult.  If the patient needs to stay at the hospital during part of their recovery, the visitor guidelines for inpatient rooms apply.  Please refer to the [facility] website for the visitor guidelines for any additional information.   If you received a COVID test during your pre-op visit  it is requested that you wear a mask when out in public, stay away from anyone that may not be feeling well and notify your surgeon if you develop symptoms. If you have been in contact with anyone that has tested positive in the last 10 days please notify you surgeon.      Pre-operative CHG Bathing Instructions   You can play a key role in reducing the risk of infection after surgery. Your skin needs to be as free of germs as possible. You can reduce the number of germs on your skin by washing with CHG (chlorhexidine  gluconate) soap before surgery. CHG is an antiseptic soap that kills germs and continues to kill germs even after washing.   DO NOT use if you have an allergy  to chlorhexidine /CHG or antibacterial soaps. If your skin becomes reddened or irritated, stop using the CHG and notify one of our RNs at [phone].              TAKE A SHOWER THE NIGHT BEFORE SURGERY AND THE DAY OF SURGERY    Please keep in mind the following:  DO NOT shave, including legs and underarms, 48 hours prior to surgery.   You may shave your face before/day of surgery.  Place clean sheets on your bed the night before surgery Use a clean washcloth (not used since being washed) for each shower. DO NOT sleep with pet's night before surgery.  CHG Shower Instructions:  Wash your  face and private area with normal soap. If you choose to wash your hair, wash first with your normal shampoo.  After you use shampoo/soap, rinse your hair and body thoroughly to remove shampoo/soap residue.  Turn the water  OFF and apply half the bottle of CHG soap to a CLEAN washcloth.  Apply CHG soap ONLY FROM YOUR NECK DOWN TO YOUR TOES (washing for 3-5 minutes)  DO NOT use CHG soap on face, private areas, open wounds, or sores.  Pay special attention to the area where your surgery is being performed.  If you are having back surgery, having someone wash your back for you may be helpful. Wait 2 minutes after CHG soap is applied, then you may rinse off the CHG soap.  Pat dry with a clean towel  Put on clean pajamas    Additional instructions for the day of surgery: DO NOT APPLY any lotions, deodorants, cologne, or perfumes.   Do not wear jewelry or makeup Do not wear nail polish, gel polish, artificial nails, or any other type of covering on natural nails (fingers and toes) Do not bring valuables to the hospital. [facility] is not responsible for valuables/personal belongings. Put on clean/comfortable clothes.  Please brush your teeth.  Ask your nurse before applying any prescription medications to the skin.

## 2023-08-14 ENCOUNTER — Encounter (HOSPITAL_COMMUNITY)
Admission: RE | Admit: 2023-08-14 | Discharge: 2023-08-14 | Disposition: A | Discharge status: Home / Self Care | Source: Ambulatory Visit | Attending: Surgery | Admitting: Surgery

## 2023-08-14 ENCOUNTER — Encounter (HOSPITAL_COMMUNITY): Payer: Self-pay

## 2023-08-14 ENCOUNTER — Other Ambulatory Visit: Payer: Self-pay

## 2023-08-14 ENCOUNTER — Other Ambulatory Visit (HOSPITAL_COMMUNITY): Payer: Self-pay

## 2023-08-14 VITALS — BP 151/84 | HR 72 | Temp 98.1°F | Resp 16 | Ht 64.0 in | Wt 141.7 lb

## 2023-08-14 DIAGNOSIS — Z955 Presence of coronary angioplasty implant and graft: Secondary | ICD-10-CM | POA: Insufficient documentation

## 2023-08-14 DIAGNOSIS — I10 Essential (primary) hypertension: Secondary | ICD-10-CM | POA: Diagnosis not present

## 2023-08-14 DIAGNOSIS — I252 Old myocardial infarction: Secondary | ICD-10-CM | POA: Diagnosis not present

## 2023-08-14 DIAGNOSIS — I272 Pulmonary hypertension, unspecified: Secondary | ICD-10-CM | POA: Insufficient documentation

## 2023-08-14 DIAGNOSIS — I2510 Atherosclerotic heart disease of native coronary artery without angina pectoris: Secondary | ICD-10-CM | POA: Insufficient documentation

## 2023-08-14 DIAGNOSIS — Z7982 Long term (current) use of aspirin: Secondary | ICD-10-CM | POA: Diagnosis not present

## 2023-08-14 DIAGNOSIS — Z01812 Encounter for preprocedural laboratory examination: Secondary | ICD-10-CM | POA: Diagnosis not present

## 2023-08-14 DIAGNOSIS — I255 Ischemic cardiomyopathy: Secondary | ICD-10-CM | POA: Insufficient documentation

## 2023-08-14 DIAGNOSIS — M810 Age-related osteoporosis without current pathological fracture: Secondary | ICD-10-CM | POA: Diagnosis not present

## 2023-08-14 DIAGNOSIS — Z01818 Encounter for other preprocedural examination: Secondary | ICD-10-CM | POA: Diagnosis present

## 2023-08-14 HISTORY — DX: Heart failure, unspecified: I50.9

## 2023-08-14 HISTORY — DX: Cardiac murmur, unspecified: R01.1

## 2023-08-14 LAB — BASIC METABOLIC PANEL WITH GFR
Anion gap: 8 (ref 5–15)
BUN: 14 mg/dL (ref 8–23)
CO2: 26 mmol/L (ref 22–32)
Calcium: 9.6 mg/dL (ref 8.9–10.3)
Chloride: 104 mmol/L (ref 98–111)
Creatinine, Ser: 0.88 mg/dL (ref 0.44–1.00)
GFR, Estimated: >60 mL/min (ref >60)
Glucose, Bld: 100 mg/dL — ABNORMAL HIGH (ref 70–99)
Potassium: 4.4 mmol/L (ref 3.5–5.1)
Sodium: 138 mmol/L (ref 135–145)

## 2023-08-14 LAB — CBC
HCT: 37.8 % (ref 36.0–46.0)
Hemoglobin: 13.6 g/dL (ref 12.0–15.0)
MCH: 35.7 pg — ABNORMAL HIGH (ref 26.0–34.0)
MCHC: 36 g/dL (ref 30.0–36.0)
MCV: 99.2 fL (ref 80.0–100.0)
Platelets: 171 K/uL (ref 150–400)
RBC: 3.81 MIL/uL — ABNORMAL LOW (ref 3.87–5.11)
RDW: 13.2 % (ref 11.5–15.5)
WBC: 5.9 K/uL (ref 4.0–10.5)
nRBC: 0 % (ref 0.0–0.2)

## 2023-08-14 NOTE — Progress Notes (Signed)
 PCP - Dr. Montie Pizza Cardiologist - Dr. Lynwood Schilling - last office visit 04/19/2023  PPM/ICD - Denies Device Orders - n/a Rep Notified - n/a  Chest x-ray - n/a EKG - 04/19/2023 Stress Test - 07/19/2015 ECHO - 11/04/2019 Cardiac Cath - 03/08/2012 with stent placed  Sleep Study - Denies CPAP - n/a  No DM  Last dose of GLP1 agonist- n/a GLP1 instructions: n/a  Blood Thinner Instructions: n/a Aspirin  Instructions: Pt instructed to stop ASA one week prior to surgery. Last dose will be July 16th.  ERAS Protcol - Clear liquids until 0730 morning of surgery PRE-SURGERY Ensure or G2- n/a  COVID TEST- n/a   Anesthesia review: Yes. Cardiac clearance.   Patient denies shortness of breath, fever, cough and chest pain at PAT appointment. Pt denies any respiratory illness/infection in the last two months.   All instructions explained to the patient, with a verbal understanding of the material. Patient agrees to go over the instructions while at home for a better understanding. Patient also instructed to self quarantine after being tested for COVID-19. The opportunity to ask questions was provided.

## 2023-08-15 NOTE — Progress Notes (Signed)
 Anesthesia Chart Review:  89 year old female follows with cardiology for history of HTN, NSTEMI 2014 with stent to LAD, ischemic cardiomyopathy, pulmonary hypertension.  Stress test 07/2015 was normal.  TTE 10/2019 showed EF 60 to 65%, moderate asymmetric left ventricular hypertrophy of the basal septal segment, grade 1 DD, normal RV, mild mitral regurgitation.  Seen by [name], NP on 07/16/2023 for preop evaluation.  Per note, Chart reviewed as part of pre-operative protocol coverage. According to the RCRI, patient has a 6.6-10.1% risk of MACE. Patient reports activity equivalent to 4.0 METS (walking and lifting weights daily). Given past medical history and time since last visit, based on ACC/AHA guidelines, Tysha A Emami would be at acceptable risk for the planned procedure without further cardiovascular testing. Patient was advised that if she develops new symptoms prior to surgery to contact our office to arrange a follow-up appointment.  she verbalized understanding. Ideally aspirin  should be continued without interruption, however if the bleeding risk is too great, aspirin  may be held for 5-7 days prior to surgery. Please resume aspirin  post operatively when it is felt to be safe from a bleeding standpoint.  Other pertinent history includes moderate hiatal hernia, recurrent UTIs and self-caths 4 times daily due to incomplete emptying.   Preop labs reviewed, unremarkable.  EKG 04/19/2023: Normal sinus rhythm.  Rate 82. Possible Left atrial enlargement.  TTE 11/04/2019:  1. Left ventricular ejection fraction, by estimation, is 60 to 65%. The  left ventricle has normal function. The left ventricle has no regional  wall motion abnormalities. There is moderate asymmetric left ventricular  hypertrophy of the basal-septal  segment. Left ventricular diastolic parameters are consistent with Grade I  diastolic dysfunction (impaired relaxation). Elevated left atrial  pressure.   2. Right  ventricular systolic function is normal. The right ventricular  size is normal.   3. The mitral valve is normal in structure. Mild mitral valve  regurgitation. No evidence of mitral stenosis.   4. The aortic valve is normal in structure. There is mild calcification  of the aortic valve. There is mild thickening of the aortic valve. Aortic  valve regurgitation is mild. Mild to moderate aortic valve  sclerosis/calcification is present, without any  evidence of aortic stenosis.   5. The inferior vena cava is normal in size with greater than 50%  respiratory variability, suggesting right atrial pressure of 3 mmHg.   Exercise tolerance test 07/19/2015: Blood pressure demonstrated a hypertensive response to exercise. There was no ST segment deviation noted during stress. T wave inversion was noted during stress in the V2 leads. Negative, adequate stress test.    Lynwood Hope, PA-C [facility] Short Stay Center/Anesthesiology Phone [phone] 08/15/2023 3:45 PM

## 2023-08-15 NOTE — Anesthesia Preprocedure Evaluation (Addendum)
 Anesthesia Evaluation  Patient identified by MRN, date of birth, ID band Patient awake    Reviewed: Allergy & Precautions, NPO status , Patient's Chart, lab work & pertinent test results  Airway Mallampati: II  TM Distance: >3 FB Neck ROM: Full    Dental  (+) Teeth Intact, Dental Advisory Given   Pulmonary neg pulmonary ROS   Pulmonary exam normal breath sounds clear to auscultation       Cardiovascular hypertension, Pt. on medications pulmonary hypertension(-) angina + CAD, + Past MI, + Cardiac Stents (DES to LAD) and +CHF  Normal cardiovascular exam+ Valvular Problems/Murmurs MR  Rhythm:Regular Rate:Normal     Neuro/Psych  PSYCHIATRIC DISORDERS Anxiety Depression    negative neurological ROS     GI/Hepatic Neg liver ROS,,,Right Inguinal hernia   Endo/Other  negative endocrine ROS    Renal/GU negative Renal ROS Bladder dysfunction      Musculoskeletal  (+) Arthritis ,    Abdominal   Peds  Hematology negative hematology ROS (+)   Anesthesia Other Findings   Reproductive/Obstetrics                              Anesthesia Physical Anesthesia Plan  ASA: 3  Anesthesia Plan: General   Post-op Pain Management: Regional block* and Tylenol  PO (pre-op)*   Induction: Intravenous  PONV Risk Score and Plan: 4 or greater and Dexamethasone , Ondansetron  and Treatment may vary due to age or medical condition  Airway Management Planned: LMA  Additional Equipment:   Intra-op Plan:   Post-operative Plan: Extubation in OR  Informed Consent: I have reviewed the patients History and Physical, chart, labs and discussed the procedure including the risks, benefits and alternatives for the proposed anesthesia with the patient or authorized representative who has indicated his/her understanding and acceptance.     Dental advisory given  Plan Discussed with: CRNA  Anesthesia Plan Comments:  (PAT note by Erin Hope, PA-C:  89 year old female follows with cardiology for history of HTN, NSTEMI 2014 with stent to LAD, ischemic cardiomyopathy, pulmonary hypertension.  Stress test 07/2015 was normal.  TTE 10/2019 showed EF 60 to 65%, moderate asymmetric left ventricular hypertrophy of the basal septal segment, grade 1 DD, normal RV, mild mitral regurgitation.  Seen by Erin Autry Cota, NP on 07/16/2023 for preop evaluation.  Per note, Chart reviewed as part of pre-operative protocol coverage. According to the RCRI, patient has a 6.6-10.1% risk of MACE. Patient reports activity equivalent to 4.0 METS (walking and lifting weights daily). Given past medical history and time since last visit, based on ACC/AHA guidelines, Erin Roth would be at acceptable risk for the planned procedure without further cardiovascular testing. Patient was advised that if she develops new symptoms prior to surgery to contact our office to arrange a follow-up appointment.  she verbalized understanding. Ideally aspirin  should be continued without interruption, however if the bleeding risk is too great, aspirin  may be held for 5-7 days prior to surgery. Please resume aspirin  post operatively when it is felt to be safe from a bleeding standpoint.  Other pertinent history includes moderate hiatal hernia, recurrent UTIs and self-caths 4 times daily due to incomplete emptying.   Preop labs reviewed, unremarkable.  EKG 04/19/2023: Normal sinus rhythm.  Rate 82. Possible Left atrial enlargement.  TTE 11/04/2019: 1. Left ventricular ejection fraction, by estimation, is 60 to 65%. The  left ventricle has normal function. The left ventricle has no regional  wall  motion abnormalities. There is moderate asymmetric left ventricular  hypertrophy of the basal-septal  segment. Left ventricular diastolic parameters are consistent with Grade I  diastolic dysfunction (impaired relaxation). Elevated left atrial  pressure.  2.  Right ventricular systolic function is normal. The right ventricular  size is normal.  3. The mitral valve is normal in structure. Mild mitral valve  regurgitation. No evidence of mitral stenosis.  4. The aortic valve is normal in structure. There is mild calcification  of the aortic valve. There is mild thickening of the aortic valve. Aortic  valve regurgitation is mild. Mild to moderate aortic valve  sclerosis/calcification is present, without any  evidence of aortic stenosis.  5. The inferior vena cava is normal in size with greater than 50%  respiratory variability, suggesting right atrial pressure of 3 mmHg.   Exercise tolerance test 07/19/2015:  Blood pressure demonstrated a hypertensive response to exercise.  There was no ST segment deviation noted during stress.  T wave inversion was noted during stress in the V2 leads.  Negative, adequate stress test.    )         Anesthesia Quick Evaluation

## 2023-08-16 DIAGNOSIS — H35722 Serous detachment of retinal pigment epithelium, left eye: Secondary | ICD-10-CM | POA: Diagnosis not present

## 2023-08-16 DIAGNOSIS — H02836 Dermatochalasis of left eye, unspecified eyelid: Secondary | ICD-10-CM | POA: Diagnosis not present

## 2023-08-16 DIAGNOSIS — H43813 Vitreous degeneration, bilateral: Secondary | ICD-10-CM | POA: Diagnosis not present

## 2023-08-16 DIAGNOSIS — H02833 Dermatochalasis of right eye, unspecified eyelid: Secondary | ICD-10-CM | POA: Diagnosis not present

## 2023-08-16 DIAGNOSIS — H353132 Nonexudative age-related macular degeneration, bilateral, intermediate dry stage: Secondary | ICD-10-CM | POA: Diagnosis not present

## 2023-08-16 DIAGNOSIS — H26491 Other secondary cataract, right eye: Secondary | ICD-10-CM | POA: Diagnosis not present

## 2023-08-16 DIAGNOSIS — H353221 Exudative age-related macular degeneration, left eye, with active choroidal neovascularization: Secondary | ICD-10-CM | POA: Diagnosis not present

## 2023-08-23 ENCOUNTER — Other Ambulatory Visit: Payer: Self-pay

## 2023-08-23 ENCOUNTER — Ambulatory Visit (HOSPITAL_BASED_OUTPATIENT_CLINIC_OR_DEPARTMENT_OTHER): Payer: Self-pay | Admitting: Registered Nurse

## 2023-08-23 ENCOUNTER — Observation Stay (HOSPITAL_COMMUNITY)
Admission: RE | Admit: 2023-08-23 | Discharge: 2023-08-24 | Disposition: A | Discharge status: Home / Self Care | Attending: Surgery | Admitting: Surgery

## 2023-08-23 ENCOUNTER — Encounter (HOSPITAL_COMMUNITY)
Admission: RE | Disposition: A | Discharge status: Home / Self Care | Payer: Self-pay | Source: Home / Self Care | Attending: Surgery

## 2023-08-23 ENCOUNTER — Ambulatory Visit (HOSPITAL_COMMUNITY): Payer: Self-pay | Admitting: Physician Assistant

## 2023-08-23 ENCOUNTER — Encounter (HOSPITAL_COMMUNITY): Payer: Self-pay | Admitting: Surgery

## 2023-08-23 DIAGNOSIS — K4090 Unilateral inguinal hernia, without obstruction or gangrene, not specified as recurrent: Secondary | ICD-10-CM | POA: Diagnosis not present

## 2023-08-23 DIAGNOSIS — I509 Heart failure, unspecified: Secondary | ICD-10-CM | POA: Diagnosis not present

## 2023-08-23 DIAGNOSIS — Z7982 Long term (current) use of aspirin: Secondary | ICD-10-CM | POA: Insufficient documentation

## 2023-08-23 DIAGNOSIS — I10 Essential (primary) hypertension: Secondary | ICD-10-CM | POA: Insufficient documentation

## 2023-08-23 DIAGNOSIS — I110 Hypertensive heart disease with heart failure: Secondary | ICD-10-CM | POA: Diagnosis not present

## 2023-08-23 DIAGNOSIS — G8918 Other acute postprocedural pain: Secondary | ICD-10-CM | POA: Diagnosis not present

## 2023-08-23 DIAGNOSIS — I2510 Atherosclerotic heart disease of native coronary artery without angina pectoris: Secondary | ICD-10-CM | POA: Diagnosis not present

## 2023-08-23 HISTORY — PX: INGUINAL HERNIA REPAIR: SHX194

## 2023-08-23 HISTORY — PX: INSERTION OF MESH: SHX5868

## 2023-08-23 LAB — CBC
HCT: 38 % (ref 36.0–46.0)
Hemoglobin: 12.5 g/dL (ref 12.0–15.0)
MCH: 31.5 pg (ref 26.0–34.0)
MCHC: 32.9 g/dL (ref 30.0–36.0)
MCV: 95.7 fL (ref 80.0–100.0)
Platelets: 144 K/uL — ABNORMAL LOW (ref 150–400)
RBC: 3.97 MIL/uL (ref 3.87–5.11)
RDW: 13.1 % (ref 11.5–15.5)
WBC: 7.7 K/uL (ref 4.0–10.5)
nRBC: 0 % (ref 0.0–0.2)

## 2023-08-23 LAB — CREATININE, SERUM
Creatinine, Ser: 0.82 mg/dL (ref 0.44–1.00)
GFR, Estimated: >60 mL/min (ref >60)

## 2023-08-23 SURGERY — REPAIR, HERNIA, INGUINAL, ADULT
Anesthesia: General | Site: Inguinal | Laterality: Right

## 2023-08-23 MED ORDER — FENTANYL CITRATE (PF) 100 MCG/2ML IJ SOLN
INTRAMUSCULAR | Status: AC
  Filled 2023-08-23: qty 2

## 2023-08-23 MED ORDER — DEXAMETHASONE SODIUM PHOSPHATE 10 MG/ML IJ SOLN
INTRAMUSCULAR | Status: DC | PRN
  Administered 2023-08-23: 10 mg via INTRAVENOUS

## 2023-08-23 MED ORDER — ALBUMIN HUMAN 5 % IV SOLN: INTRAVENOUS | Status: DC | PRN

## 2023-08-23 MED ORDER — EPHEDRINE SULFATE-NACL 50-0.9 MG/10ML-% IV SOSY
PREFILLED_SYRINGE | INTRAVENOUS | Status: DC | PRN
  Administered 2023-08-23 (×4): 5 mg via INTRAVENOUS

## 2023-08-23 MED ORDER — ATORVASTATIN CALCIUM 80 MG PO TABS
80.0000 mg | ORAL_TABLET | Freq: Every evening | ORAL | Status: DC
  Administered 2023-08-23: 80 mg via ORAL
  Filled 2023-08-23: qty 1

## 2023-08-23 MED ORDER — CHLORHEXIDINE GLUCONATE CLOTH 2 % EX PADS: 6.0000 | MEDICATED_PAD | Freq: Once | CUTANEOUS | Status: DC

## 2023-08-23 MED ORDER — DOCUSATE SODIUM 100 MG PO CAPS
100.0000 mg | ORAL_CAPSULE | Freq: Two times a day (BID) | ORAL | Status: DC
  Administered 2023-08-23 (×2): 100 mg via ORAL
  Filled 2023-08-23 (×2): qty 1

## 2023-08-23 MED ORDER — PROPOFOL 10 MG/ML IV BOLUS
INTRAVENOUS | Status: AC
  Filled 2023-08-23: qty 20

## 2023-08-23 MED ORDER — PHENYLEPHRINE 80 MCG/ML (10ML) SYRINGE FOR IV PUSH (FOR BLOOD PRESSURE SUPPORT)
PREFILLED_SYRINGE | INTRAVENOUS | Status: AC
  Filled 2023-08-23: qty 10

## 2023-08-23 MED ORDER — ALPRAZOLAM 0.25 MG PO TABS: 0.2500 mg | ORAL_TABLET | Freq: Every evening | ORAL | Status: DC | PRN

## 2023-08-23 MED ORDER — ACETAMINOPHEN 500 MG PO TABS
1000.0000 mg | ORAL_TABLET | ORAL | Status: AC
  Administered 2023-08-23: 1000 mg via ORAL
  Filled 2023-08-23: qty 2

## 2023-08-23 MED ORDER — ONDANSETRON HCL 4 MG/2ML IJ SOLN: 4.0000 mg | Freq: Once | INTRAMUSCULAR | Status: DC | PRN

## 2023-08-23 MED ORDER — FENTANYL CITRATE PF 50 MCG/ML IJ SOSY
50.0000 ug | PREFILLED_SYRINGE | Freq: Once | INTRAMUSCULAR | Status: AC
  Administered 2023-08-23: 50 ug via INTRAVENOUS
  Filled 2023-08-23: qty 1

## 2023-08-23 MED ORDER — PHENYLEPHRINE 80 MCG/ML (10ML) SYRINGE FOR IV PUSH (FOR BLOOD PRESSURE SUPPORT)
PREFILLED_SYRINGE | INTRAVENOUS | Status: DC | PRN
  Administered 2023-08-23 (×2): 80 ug via INTRAVENOUS
  Administered 2023-08-23 (×4): 160 ug via INTRAVENOUS

## 2023-08-23 MED ORDER — SODIUM CHLORIDE 0.9 % IV SOLN: INTRAVENOUS | Status: DC

## 2023-08-23 MED ORDER — ORAL CARE MOUTH RINSE: 15.0000 mL | Freq: Once | OROMUCOSAL | Status: AC

## 2023-08-23 MED ORDER — DIPHENHYDRAMINE HCL 50 MG/ML IJ SOLN: 12.5000 mg | Freq: Four times a day (QID) | INTRAMUSCULAR | Status: DC | PRN

## 2023-08-23 MED ORDER — DIPHENHYDRAMINE HCL 12.5 MG/5ML PO ELIX: 12.5000 mg | ORAL_SOLUTION | Freq: Four times a day (QID) | ORAL | Status: DC | PRN

## 2023-08-23 MED ORDER — LISINOPRIL 5 MG PO TABS: 5.0000 mg | ORAL_TABLET | Freq: Every day | ORAL | Status: DC | PRN

## 2023-08-23 MED ORDER — DEXAMETHASONE SODIUM PHOSPHATE 10 MG/ML IJ SOLN
INTRAMUSCULAR | Status: AC
  Filled 2023-08-23: qty 1

## 2023-08-23 MED ORDER — FENTANYL CITRATE (PF) 250 MCG/5ML IJ SOLN
INTRAMUSCULAR | Status: DC | PRN
  Administered 2023-08-23 (×3): 25 ug via INTRAVENOUS
  Administered 2023-08-23: 50 ug via INTRAVENOUS

## 2023-08-23 MED ORDER — ENOXAPARIN SODIUM 40 MG/0.4ML IJ SOSY: 40.0000 mg | PREFILLED_SYRINGE | INTRAMUSCULAR | Status: DC

## 2023-08-23 MED ORDER — ONDANSETRON HCL 4 MG/2ML IJ SOLN: 4.0000 mg | Freq: Four times a day (QID) | INTRAMUSCULAR | Status: DC | PRN

## 2023-08-23 MED ORDER — FENTANYL CITRATE (PF) 250 MCG/5ML IJ SOLN
INTRAMUSCULAR | Status: AC
  Filled 2023-08-23: qty 5

## 2023-08-23 MED ORDER — ONDANSETRON 4 MG PO TBDP
4.0000 mg | ORAL_TABLET | Freq: Four times a day (QID) | ORAL | Status: DC | PRN
Start: 2023-08-23 — End: 2023-08-24

## 2023-08-23 MED ORDER — PROPOFOL 10 MG/ML IV BOLUS
INTRAVENOUS | Status: DC | PRN
  Administered 2023-08-23: 130 mg via INTRAVENOUS

## 2023-08-23 MED ORDER — LIDOCAINE 2% (20 MG/ML) 5 ML SYRINGE
INTRAMUSCULAR | Status: DC | PRN
  Administered 2023-08-23: 80 mg via INTRAVENOUS

## 2023-08-23 MED ORDER — EPHEDRINE 5 MG/ML INJ
INTRAVENOUS | Status: AC
  Filled 2023-08-23: qty 5

## 2023-08-23 MED ORDER — ACETAMINOPHEN 650 MG RE SUPP: 650.0000 mg | Freq: Four times a day (QID) | RECTAL | Status: DC | PRN

## 2023-08-23 MED ORDER — 0.9 % SODIUM CHLORIDE (POUR BTL) OPTIME
TOPICAL | Status: DC | PRN
  Administered 2023-08-23: 1000 mL

## 2023-08-23 MED ORDER — ACETAMINOPHEN 325 MG PO TABS
650.0000 mg | ORAL_TABLET | Freq: Four times a day (QID) | ORAL | Status: DC | PRN
  Administered 2023-08-23: 650 mg via ORAL
  Filled 2023-08-23: qty 2

## 2023-08-23 MED ORDER — ONDANSETRON HCL 4 MG/2ML IJ SOLN
INTRAMUSCULAR | Status: DC | PRN
  Administered 2023-08-23: 4 mg via INTRAVENOUS

## 2023-08-23 MED ORDER — HYDROCODONE-ACETAMINOPHEN 5-325 MG PO TABS: 1.0000 | ORAL_TABLET | ORAL | Status: DC | PRN

## 2023-08-23 MED ORDER — ONDANSETRON HCL 4 MG/2ML IJ SOLN
INTRAMUSCULAR | Status: AC
  Filled 2023-08-23: qty 2

## 2023-08-23 MED ORDER — CEFAZOLIN SODIUM-DEXTROSE 2-4 GM/100ML-% IV SOLN
2.0000 g | INTRAVENOUS | Status: AC
  Administered 2023-08-23: 2 g via INTRAVENOUS
  Filled 2023-08-23: qty 100

## 2023-08-23 MED ORDER — CHLORHEXIDINE GLUCONATE 0.12 % MT SOLN
15.0000 mL | Freq: Once | OROMUCOSAL | Status: AC
  Administered 2023-08-23: 15 mL via OROMUCOSAL
  Filled 2023-08-23: qty 15

## 2023-08-23 MED ORDER — LIDOCAINE 2% (20 MG/ML) 5 ML SYRINGE
INTRAMUSCULAR | Status: AC
  Filled 2023-08-23: qty 5

## 2023-08-23 MED ORDER — TRAMADOL HCL 50 MG PO TABS: 50.0000 mg | ORAL_TABLET | Freq: Four times a day (QID) | ORAL | Status: DC | PRN

## 2023-08-23 MED ORDER — MORPHINE SULFATE (PF) 2 MG/ML IV SOLN: 2.0000 mg | INTRAVENOUS | Status: DC | PRN

## 2023-08-23 MED ORDER — BUPIVACAINE-EPINEPHRINE 0.25% -1:200000 IJ SOLN
INTRAMUSCULAR | Status: DC | PRN
  Administered 2023-08-23: 10 mL

## 2023-08-23 MED ORDER — FENTANYL CITRATE (PF) 100 MCG/2ML IJ SOLN: 25.0000 ug | INTRAMUSCULAR | Status: DC | PRN

## 2023-08-23 MED ORDER — NITROGLYCERIN 0.4 MG SL SUBL: 0.4000 mg | SUBLINGUAL_TABLET | SUBLINGUAL | Status: DC | PRN

## 2023-08-23 MED ORDER — BUPIVACAINE-EPINEPHRINE (PF) 0.25% -1:200000 IJ SOLN
INTRAMUSCULAR | Status: AC
  Filled 2023-08-23: qty 30

## 2023-08-23 MED ORDER — LACTATED RINGERS IV SOLN: INTRAVENOUS | Status: DC

## 2023-08-23 SURGICAL SUPPLY — 31 items
BENZOIN TINCTURE PRP APPL 2/3 (GAUZE/BANDAGES/DRESSINGS) ×1 IMPLANT
BLADE CLIPPER SURG (BLADE) IMPLANT
CHLORAPREP W/TINT 26 (MISCELLANEOUS) ×1 IMPLANT
CLSR STERI-STRIP ANTIMIC 1/2X4 (GAUZE/BANDAGES/DRESSINGS) IMPLANT
COVER SURGICAL LIGHT HANDLE (MISCELLANEOUS) ×1 IMPLANT
DRAIN PENROSE .5X12 LATEX STL (DRAIN) ×1 IMPLANT
DRAPE LAPAROSCOPIC ABDOMINAL (DRAPES) IMPLANT
DRAPE LAPAROTOMY TRNSV 102X78 (DRAPES) IMPLANT
DRSG TEGADERM 4X4.75 (GAUZE/BANDAGES/DRESSINGS) ×1 IMPLANT
ELECT CAUTERY BLADE 6.4 (BLADE) IMPLANT
ELECTRODE REM PT RTRN 9FT ADLT (ELECTROSURGICAL) ×1 IMPLANT
GAUZE 4X4 16PLY ~~LOC~~+RFID DBL (SPONGE) ×1 IMPLANT
GAUZE SPONGE 4X4 12PLY STRL (GAUZE/BANDAGES/DRESSINGS) ×1 IMPLANT
GLOVE BIO SURGEON STRL SZ7 (GLOVE) ×1 IMPLANT
GLOVE BIOGEL PI IND STRL 7.5 (GLOVE) ×1 IMPLANT
GOWN STRL REUS W/ TWL LRG LVL3 (GOWN DISPOSABLE) ×2 IMPLANT
KIT BASIN OR (CUSTOM PROCEDURE TRAY) ×1 IMPLANT
KIT TURNOVER KIT B (KITS) ×1 IMPLANT
MESH ULTRAPRO 3X6 7.6X15CM (Mesh General) IMPLANT
NDL HYPO 25GX1X1/2 BEV (NEEDLE) ×1 IMPLANT
NEEDLE HYPO 25GX1X1/2 BEV (NEEDLE) ×1 IMPLANT
NS IRRIG 1000ML POUR BTL (IV SOLUTION) ×1 IMPLANT
PACK GENERAL/GYN (CUSTOM PROCEDURE TRAY) ×1 IMPLANT
PAD ARMBOARD POSITIONER FOAM (MISCELLANEOUS) ×1 IMPLANT
SPONGE INTESTINAL PEANUT (DISPOSABLE) ×1 IMPLANT
SUT MNCRL AB 4-0 PS2 18 (SUTURE) ×1 IMPLANT
SUT VIC AB 0 CT2 27 (SUTURE) ×1 IMPLANT
SUT VIC AB 2-0 SH 27X BRD (SUTURE) ×1 IMPLANT
SUT VIC AB 3-0 SH 27XBRD (SUTURE) ×1 IMPLANT
SYR CONTROL 10ML LL (SYRINGE) ×1 IMPLANT
TOWEL GREEN STERILE (TOWEL DISPOSABLE) ×1 IMPLANT

## 2023-08-23 NOTE — H&P (Signed)
 Subjective    Chief Complaint: Follow-up   Cardiology - [facility] PCP - Montie Pizza Ref - Worth Turmel   History of Present Illness: Erin Roth is a 89 y.o. female who is seen today for hernia follow-up   This is an 89 year old female who is fairly active who presents with recent onset of a right inguinal bulge.  She works in her garden frequently and noticed an uncomfortable bulge in her right groin.  This remains reducible.  She denies any GI obstructive symptoms.   Upon review of her records, the patient had a robotic laparoscopic sacrocolpopexy by Dr. Cam.  She did very well with that surgery and was discharged after 1 night in the hospital.  Prior to that surgery, in November 2022, she had a CT scan that incidentally noted a right inguinal hernia containing only fat.  She also had a small umbilical hernia.  She was initially examined in April 2024.  We recommended right inguinal hernia repair with mesh.  However, the patient developed COVID and has had multiple medical issues over the last year.  Her health status is stable at this time and she presents now to discuss right inguinal hernia repair.  She has no symptoms at her umbilicus.  She denies any obstructive symptoms.  The hernia remains reducible although occasionally uncomfortable.  She recently saw her cardiologist.  She is on a baby aspirin  daily.   Review of Systems: A complete review of systems was obtained from the patient.  I have reviewed this information and discussed as appropriate with the patient.  See HPI as well for other ROS.   Review of Systems  Constitutional: Negative.   HENT:  Positive for hearing loss.   Eyes: Negative.   Respiratory: Negative.    Cardiovascular: Negative.   Gastrointestinal:  Positive for abdominal pain.  Genitourinary: Negative.   Musculoskeletal: Negative.   Skin: Negative.   Neurological: Negative.   Endo/Heme/Allergies:  Bruises/bleeds easily.  Psychiatric/Behavioral:  Negative.          Medical History:     Past Medical History:  Diagnosis Date   Arthritis     Hypertension           Patient Active Problem List  Diagnosis   CAD (coronary artery disease), native coronary artery   Anxiety   Cardiomyopathy (CMS/HHS-HCC)   Cystocele with prolapse   Dyslipidemia   Essential hypertension   Intermediate stage nonexudative age-related macular degeneration of both eyes   History of MI (myocardial infarction)   Hyperlipidemia   Mild pulmonary hypertension (CMS/HHS-HCC)   Mild mitral regurgitation   NSTEMI (non-ST elevated myocardial infarction) (CMS/HHS-HCC)   Right inguinal hernia           Past Surgical History:  Procedure Laterality Date   Back surgery               Allergies  Allergen Reactions   Venom-Honey Bee Anaphylaxis   Estrogens Unknown      Breast pain   Others Other (See Comments)      Uncoded Allergy. Allergen: BEE-STINGS, Other Reaction: EXTREME SWELLING   Prednisone Itching   Sulfa (Sulfonamide Antibiotics) Rash   Escitalopram Rash and Swelling      edema            Current Outpatient Medications on File Prior to Visit  Medication Sig Dispense Refill   *atenolol oral         atorvastatin  (LIPITOR ) 80 MG tablet 1 tablet Orally Once a day  for 90 days       cholecalciferol (VITAMIN D3) 1000 unit capsule Take 1 capsule by mouth once daily       hydrocodone -acetaminophen  (NORCO) 5-325 mg tablet 1-2 tabs po every 4-6 hours as needed for severe pain (will cause drowsiness).       lisinopriL  (ZESTRIL ) 5 MG tablet 1 tablet as needed Orally Once a day       nitrofurantoin , macrocrystal-monohydrate, (MACROBID ) 100 MG capsule         nitroGLYcerin  (NITROSTAT ) 0.4 MG SL tablet 1 TABLET UNDER THE TONGUE EVERY 5 MINUTES FOR UP TO 3 DOSES AS NEEDEDFOR CHEST PAIN.        No current facility-administered medications on file prior to visit.      History reviewed. No pertinent family history.    Social History       Tobacco  Use  Smoking Status Never  Smokeless Tobacco Never      Social History        Socioeconomic History   Marital status: Married  Tobacco Use   Smoking status: Never   Smokeless tobacco: Never  Substance and Sexual Activity   Alcohol use: Never   Drug use: Never    Social Drivers of Health        Housing Stability: Unknown (07/06/2023)    Housing Stability Vital Sign     Homeless in the Last Year: No      Objective:         Vitals:    07/06/23 0945  PainSc: 0-No pain      Physical Exam    Constitutional:  WDWN in NAD, conversant, no obvious deformities; lying in bed comfortably Eyes:  Pupils equal, round; sclera anicteric; moist conjunctiva; no lid lag HENT:  Oral mucosa moist; good dentition  Neck:  No masses palpated, trachea midline; no thyromegaly Lungs:  CTA bilaterally; normal respiratory effort CV:  Regular rate and rhythm; no murmurs; extremities well-perfused with no edema Abd:  +bowel sounds, soft, non-tender, no palpable organomegaly; no palpable umbilical hernia The right groin shows a visible palpable inguinal hernia that is reducible.  No sign of left inguinal hernia Musc: Normal gait; no apparent clubbing or cyanosis in extremities Lymphatic:  No palpable cervical or axillary lymphadenopathy Skin:  Warm, dry; no sign of jaundice Psychiatric - alert and oriented x 4; calm mood and affect     Labs, Imaging and Diagnostic Testing: CLINICAL DATA:  Left flank pain, nausea, vomiting and chills.   EXAM: CT ABDOMEN AND PELVIS WITH CONTRAST   TECHNIQUE: Multidetector CT imaging of the abdomen and pelvis was performed using the standard protocol following bolus administration of intravenous contrast.   CONTRAST:  80mL OMNIPAQUE  IOHEXOL  350 MG/ML SOLN   COMPARISON:  CT with IV contrast 09/20/2015   FINDINGS: Lower chest: There are scattered linear scar-like opacities. Mild cardiomegaly without pericardial effusion. Moderate-sized hiatal hernia  increased in size since prior study.   Hepatobiliary: 18 cm in length liver, mildly steatotic with multiple scattered cysts, largest is septated in the posterior segment of the right hepatic lobe measuring 2.4 cm, previously 1.6 cm. There is no mass enhancement , biliary ductal dilatation or focal gallbladder abnormality.   Pancreas: Unremarkable.   Spleen: Unremarkable.   Adrenals/Urinary Tract: There is no adrenal mass. Small chronic calcification noted in the lateral limb right adrenal gland. There are few small right renal hypodensities which are too small to characterize. There is no hydronephrosis on the right, no intrarenal stone either  side.   On the left, there is increased moderate to severe hydronephrosis to the UPJ, without visible UPJ or ureteral stone or ureteral dilatation. Multiple left renal cysts are again noted and there is increased left perinephric stranding and fluid, and asymmetric left renal cortical contrast retention the latter compatible with obstructive uropathy. The bladder wall and lumen are unremarkable.   Stomach/Bowel: Moderate-sized hiatal hernia. The gastric wall and unopacified small bowel are unremarkable. There is a normal caliber appendix. No evidence of colitis or diverticulitis. Mild-to-moderate fecal stasis.   Vascular/Lymphatic: Aortic atherosclerosis. No enlarged abdominal or pelvic lymph nodes.   Reproductive: The uterus is absent.  No adnexal mass is seen.   Other: There is left perinephric fluid but no other free fluid is seen. There is no free air, hemorrhage or abscess.   Musculoskeletal: Small umbilical and right inguinal fat hernias. Multiple stable pelvic phleboliths. There is osteopenia, S shaped thoracolumbar scoliosis and advanced degenerative change of the lumbar spine with multilevel spinous process abutment. There are chronic compression fractures of T12, L1 and L2.   IMPRESSION: 1. Increased moderate to severe  left hydronephrosis to the UPJ with cortical contrast retention consistent with obstructive uropathy, and increased perinephric stranding and fluid which could be due to a caliceal leak, obstructive uropathy or infectious process. There is no appreciable stone. Findings could be due to a recently passed stone, UPJ stenosis or stricture. Urology consult recommended. 2. Small scattered liver cysts with mild hepatic steatosis. 3. Constipation, scattered diverticulosis. 4. Osteopenia, scoliosis, degenerative changes and chronic compression fractures. 5. Moderate-sized hiatal hernia increased from 2017.     Electronically Signed   By: Francis Quam M.D.   On: 12/27/2020 21:52     Assessment and Plan:  Diagnoses and all orders for this visit:   Right inguinal hernia     Since the hernia is becoming more symptomatic, would recommend right inguinal hernia repair with mesh.The surgical procedure has been discussed with the patient.  Potential risks, benefits, alternative treatments, and expected outcomes have been explained.  All of the patient's questions at this time have been answered.  The likelihood of reaching the patient's treatment goal is good.  The patient understand the proposed surgical procedure and wishes to proceed.   Donnice POUR. Belinda, MD, [facility]  General Surgery   08/23/2023 10:12 AM

## 2023-08-23 NOTE — Transfer of Care (Signed)
 Immediate Anesthesia Transfer of Care Note  Patient: Erin Roth  Procedure(s) Performed: REPAIR, HERNIA, INGUINAL, ADULT (Right: Inguinal) INSERTION OF MESH (Right: Inguinal)  Patient Location: PACU  Anesthesia Type:General  Level of Consciousness: awake, alert , and oriented  Airway & Oxygen Therapy: Patient Spontanous Breathing  Post-op Assessment: Report given to RN and Post -op Vital signs reviewed and stable  Post vital signs: Reviewed and stable  Last Vitals:  Vitals Value Taken Time  BP 160/86 11:46  Temp    Pulse 90 08/23/23 11:46  Resp 12   SpO2 99 % 08/23/23 11:46  Vitals shown include unfiled device data.  Last Pain:  Vitals:   08/23/23 1025  TempSrc:   PainSc: 0-No pain         Complications: There were no known notable events for this encounter.

## 2023-08-23 NOTE — Discharge Instructions (Signed)
 [facility] _______[facility] INGUINAL HERNIA REPAIR: POST OP INSTRUCTIONS  Always review your discharge instruction sheet given to you by the facility where your surgery was performed. IF YOU HAVE DISABILITY OR FAMILY LEAVE FORMS, YOU MUST BRING THEM TO THE OFFICE FOR PROCESSING.   DO NOT GIVE THEM TO YOUR DOCTOR.  1. A  prescription for pain medication may be given to you upon discharge.  Take your pain medication as prescribed, if needed.  If narcotic pain medicine is not needed, then you may take acetaminophen  (Tylenol ) or ibuprofen (Advil) as needed. 2. Take your usually prescribed medications unless otherwise directed. If you need a refill on your pain medication, please contact your pharmacy.  They will contact our office to request authorization. Prescriptions will not be filled after 5 pm or on week-ends. 3. You should follow a light diet the first 24 hours after arrival home, such as soup and crackers, etc.  Be sure to include lots of fluids daily.  Resume your normal diet the day after surgery. 4.Most patients will experience some swelling and bruising in the groin.  Ice packs and reclining will help.  Swelling and bruising can take several days to resolve.  6. It is common to experience some constipation if taking pain medication after surgery.  Increasing fluid intake and taking a stool softener (such as Colace) will usually help or prevent this problem from occurring.  A mild laxative (Milk of Magnesia or Miralax ) should be taken according to package directions if there are no bowel movements after 48 hours. 7. Unless discharge instructions indicate otherwise, you may remove your bandages 24-48 hours after surgery, and you may shower at that time.  You may have steri-strips (small skin tapes) in place directly over the incision.  These strips should be left on the skin for 7-10 days.   8. ACTIVITIES:  You may resume regular (light) daily activities beginning the next day--such  as daily self-care, walking, climbing stairs--gradually increasing activities as tolerated.  You may have sexual intercourse when it is comfortable.  Refrain from any heavy lifting or straining until approved by your doctor.  a.You may drive when you are no longer taking prescription pain medication, you can comfortably wear a seatbelt, and you can safely maneuver your car and apply brakes. b.RETURN TO WORK:   _____________________________________________  9.You should see your doctor in the office for a follow-up appointment approximately 2-3 weeks after your surgery.  Make sure that you call for this appointment within a day or two after you arrive home to insure a convenient appointment time. 10.OTHER INSTRUCTIONS: _________________________    _____________________________________  WHEN TO CALL YOUR DOCTOR: Fever over 101.0 Inability to urinate Nausea and/or vomiting Extreme swelling or bruising Continued bleeding from incision. Increased pain, redness, or drainage from the incision  The clinic staff is available to answer your questions during regular business hours.  Please don't hesitate to call and ask to speak to one of the nurses for clinical concerns.  If you have a medical emergency, go to the nearest emergency room or call 911.  A surgeon from [facility] is always on call at the hospital   [address], [place], [place]  [address] ?  [address] [address], [place], [place]   [address] [phone] ? [phone] ? FAX [phone] Web site: [email or web address]

## 2023-08-23 NOTE — Anesthesia Procedure Notes (Signed)
 Procedure Name: Intubation Date/Time: 08/23/2023 10:38 AM  Performed by: Christopher Comings, CRNAPre-anesthesia Checklist: Patient identified, Emergency Drugs available, Suction available and Patient being monitored Patient Re-evaluated:Patient Re-evaluated prior to induction Oxygen Delivery Method: Circle system utilized Preoxygenation: Pre-oxygenation with 100% oxygen Induction Type: IV induction Ventilation: Mask ventilation without difficulty LMA: LMA inserted LMA Size: 3.0 Number of attempts: 1 Placement Confirmation: positive ETCO2 and breath sounds checked- equal and bilateral Tube secured with: Tape Dental Injury: Teeth and Oropharynx as per pre-operative assessment

## 2023-08-23 NOTE — Op Note (Signed)
 Right inguinal hernia repair with mesh Procedure Note  Indications: The patient presented with a history of a right, reducible inguinal hernia.    Pre-operative Diagnosis: right reducible inguinal hernia Post-operative Diagnosis: same  Surgeon: Donnice MARLA Lima   Resident: Dr. Mae Flock  I was personally present during the key and critical portions of this procedure and immediately available throughout the entire procedure, as documented in my operative note.   Anesthesia: General LMA anesthesia and TAP block  ASA Class: 3  Procedure Details  The patient was seen again in the Holding Room. The risks, benefits, complications, treatment options, and expected outcomes were discussed with the patient. The possibilities of reaction to medication, pulmonary aspiration, perforation of viscus, bleeding, recurrent infection, the need for additional procedures, and development of a complication requiring transfusion or further operation were discussed with the patient and/or family. The likelihood of success in repairing the hernia and returning the patient to their previous functional status is good.  There was concurrence with the proposed plan, and informed consent was obtained. The site of surgery was properly noted/marked. The patient was taken to the Operating Room, identified as Erin Roth, and the procedure verified as right inguinal hernia repair. A Time Out was held and the above information confirmed.  The patient was placed in the supine position and underwent induction of anesthesia. The lower abdomen and groin was prepped with Chloraprep and draped in the standard fashion, and 0.25% Marcaine  with epinephrine  was used to anesthetize the skin over the mid-portion of the inguinal canal. An oblique incision was made. Dissection was carried down through the subcutaneous tissue with cautery to the external oblique fascia.  There is a small tear in the external oblique fascia and a palpable  bulge.  We opened the external oblique fascia along the direction of its fibers to the external ring.  The patient has a direct defect with a protruding direct hernia sac.  We divided the round ligament and reduced the direct hernia sac  The floor of the inguinal canal was inspected and revealed a large direct defect.  We closed the defect with 0 Vicryl.   We used a  3x6 inch Ultrapro mesh which was cut into and oval shape and inserted and deployed across the floor of the inguinal canal. The mesh was tucked underneath the external oblique fascia laterally.  The mesh was secured to the pubic tubercle with 0 Vicryl.  Additional stay sutures were used to attach the edges of the mesh to the internal oblique fascia superiorly and the shelving edge inferiorly.  The external oblique fascia was reapproximated with 2-0 Vicryl.  3-0 Vicryl was used to close the subcutaneous tissues and 4-0 Monocryl was used to close the skin in subcuticular fashion.  Benzoin and steri-strips were used to seal the incision.  A clean dressing was applied.  The patient was then extubated and brought to the recovery room in stable condition.  All sponge, instrument, and needle counts were correct prior to closure and at the conclusion of the case.   Estimated Blood Loss: Minimal                 Complications: None; patient tolerated the procedure well.         Disposition: PACU - hemodynamically stable.         Condition: stable  Donnice MARLA. Lima, MD, [facility]  General Surgery   08/23/2023 11:37 AM

## 2023-08-24 ENCOUNTER — Encounter (HOSPITAL_COMMUNITY): Payer: Self-pay | Admitting: Surgery

## 2023-08-24 DIAGNOSIS — K4090 Unilateral inguinal hernia, without obstruction or gangrene, not specified as recurrent: Secondary | ICD-10-CM | POA: Diagnosis not present

## 2023-08-24 MED ORDER — TRAMADOL HCL 50 MG PO TABS: 50.0000 mg | ORAL_TABLET | Freq: Four times a day (QID) | ORAL | 0 refills | Status: AC | PRN

## 2023-08-24 NOTE — Discharge Summary (Signed)
 Physician Discharge Summary  Patient ID: Erin Roth MRN: [id number] DOB/AGE: 88/01/1933 88 y.o.  Admit date: 08/23/2023 Discharge date: 08/24/2023  Admission Diagnoses:  Right inguinal hernia  Discharge Diagnoses: Right inguinal hernia (direct) Principal Problem:   Right inguinal hernia   Discharged Condition: good  Hospital Course: Right inguinal hernia repair with mesh 08/23/23.  Due to her age and cardiac comorbidities, she was kept overnight for observation.  No issues.  She reports minimal discomfort, controlled with Tylenol .  No nausea or vomiting.    Treatments: surgery: RIH repair with mesh  Discharge Exam: Blood pressure (!) 100/54, pulse 68, temperature 97.9 F (36.6 C), temperature source Oral, resp. rate 18, height 5' 4 (1.626 m), weight 64.3 kg, SpO2 97%. Right groin - minimal swelling, slight bruising at medial end of incision; mildly tender  Disposition: Discharge disposition: 01-Home or Self Care       Discharge Instructions     Call MD for:  persistant nausea and vomiting   Complete by: As directed    Call MD for:  redness, tenderness, or signs of infection (pain, swelling, redness, odor or green/yellow discharge around incision site)   Complete by: As directed    Call MD for:  severe uncontrolled pain   Complete by: As directed    Call MD for:  temperature >100.4   Complete by: As directed    Diet general   Complete by: As directed    Driving Restrictions   Complete by: As directed    Do not drive while taking pain medications   Increase activity slowly   Complete by: As directed    May shower / Bathe   Complete by: As directed       Allergies as of 08/24/2023       Reactions   Bee Venom Anaphylaxis   Estrogens Other (See Comments)   Breast pain   Forteo [parathyroid Hormone (recomb)] Other (See Comments)   Arthralgias    Lexapro [escitalopram Oxalate] Swelling, Rash   Edema    Pneumococcal Vaccines Rash   Sulfa Antibiotics Rash         Medication List     TAKE these medications    acetaminophen  500 MG tablet Commonly known as: TYLENOL  Take 500 mg by mouth daily as needed for moderate pain (pain score 4-6) or headache.   Align 4 MG Caps Take 4 mg by mouth daily.   ALPRAZolam  0.25 MG tablet Commonly known as: XANAX  Take 0.25 mg by mouth at bedtime as needed for anxiety or sleep.   aspirin  EC 81 MG tablet Take 81 mg by mouth daily.   atorvastatin  80 MG tablet Commonly known as: LIPITOR  Take 1 tablet (80 mg total) by mouth daily. What changed: when to take this   D 1000 25 MCG (1000 UT) capsule Generic drug: Cholecalciferol Take 1,000 Units by mouth daily.   Evenity  105 MG/1. Sosy injection Generic drug: Romosozumab -aqqg Inject 210 mg into the skin every 30 (thirty) days.   Fish Oil 1000 MG Caps Take 1,000 mg by mouth daily.   lisinopril  5 MG tablet Commonly known as: ZESTRIL  Take 1 tablet (5 mg total) by mouth daily as needed (high blood pressure).   nitroGLYCERIN  0.4 MG SL tablet Commonly known as: NITROSTAT  DISSOLVE ONE TABLET UNDER THE TONGUE EVERY FIVE MINUTES FOR UP TO 3 DOSES AS NEEDED FOR CHEST PAIN   PRESERVISION/LUTEIN PO Take 1 capsule by mouth daily.   traMADol  50 MG tablet Commonly known as: ULTRAM  Take  1 tablet (50 mg total) by mouth every 6 (six) hours as needed (mild pain).        Follow-up Information     Belinda Cough, MD Follow up in 3 week(s).   Specialty: General Surgery Contact information: [address] [place] [address] [place] [address] [phone]                 Signed: Cough MARLA Belinda 08/24/2023, 8:33 AM

## 2023-08-24 NOTE — Anesthesia Postprocedure Evaluation (Signed)
 Anesthesia Post Note  Patient: Erin Roth  Procedure(s) Performed: REPAIR, HERNIA, INGUINAL, ADULT (Right: Inguinal) INSERTION OF MESH (Right: Inguinal)     Patient location during evaluation: PACU Anesthesia Type: General Level of consciousness: awake and alert Pain management: pain level controlled Vital Signs Assessment: post-procedure vital signs reviewed and stable Respiratory status: spontaneous breathing, nonlabored ventilation and respiratory function stable Cardiovascular status: blood pressure returned to baseline and stable Postop Assessment: no apparent nausea or vomiting Anesthetic complications: no   There were no known notable events for this encounter.  Last Vitals:    Last Pain:                 Garnette FORBES Skillern

## 2023-09-04 DIAGNOSIS — R339 Retention of urine, unspecified: Secondary | ICD-10-CM | POA: Diagnosis not present

## 2023-09-04 MED ORDER — BUPIVACAINE-EPINEPHRINE (PF) 0.5% -1:200000 IJ SOLN
INTRAMUSCULAR | Status: DC | PRN
  Administered 2023-08-23: 30 mL via PERINEURAL

## 2023-09-04 MED ORDER — CLONIDINE HCL (ANALGESIA) 100 MCG/ML EP SOLN
EPIDURAL | Status: DC | PRN
  Administered 2023-08-23: 50 ug

## 2023-09-04 NOTE — Addendum Note (Signed)
 Addendum  created 09/04/23 1732 by Corinne Garnette BRAVO, MD   Child order released for a procedure order, Clinical Note Signed, Intraprocedure Blocks edited, Intraprocedure Meds edited, SmartForm saved

## 2023-09-04 NOTE — Anesthesia Procedure Notes (Signed)
 Anesthesia Regional Block: TAP block   Pre-Anesthetic Checklist: , timeout performed,  Correct Patient, Correct Site, Correct Laterality,  Correct Procedure, Correct Position, site marked,  Risks and benefits discussed,  Surgical consent,  Pre-op evaluation,  At surgeon's request and post-op pain management  Laterality: Right  Prep: chloraprep       Needles:  Injection technique: Single-shot  Needle Type: Echogenic Stimulator Needle     Needle Length: 10cm  Needle Gauge: 21     Additional Needles:   Procedures:,,,, ultrasound used (permanent image in chart),,    Narrative:  Start time: 08/23/2023 10:20 AM End time: 08/23/2023 10:25 AM Injection made incrementally with aspirations every 5 mL.  Performed by: Personally   Additional Notes: No pain on injection. No increased resistance to injection. Injection made in 5cc increments.  Good needle visualization.  Patient tolerated procedure well.

## 2023-09-05 ENCOUNTER — Other Ambulatory Visit: Payer: Self-pay

## 2023-09-05 NOTE — Progress Notes (Signed)
 Specialty Pharmacy Refill Coordination Note  Erin Roth is a 89 y.o. female contacted today regarding refills of specialty medication(s) Romosozumab -aqqg (Evenity )   Patient requested Delivery   Delivery date: 09/13/23   Verified address: [facility] [place], [place], [place]   Medication will be filled on 09/12/23.

## 2023-09-06 DIAGNOSIS — M810 Age-related osteoporosis without current pathological fracture: Secondary | ICD-10-CM | POA: Diagnosis not present

## 2023-09-06 DIAGNOSIS — I10 Essential (primary) hypertension: Secondary | ICD-10-CM | POA: Diagnosis not present

## 2023-09-06 DIAGNOSIS — I2510 Atherosclerotic heart disease of native coronary artery without angina pectoris: Secondary | ICD-10-CM | POA: Diagnosis not present

## 2023-09-06 DIAGNOSIS — F5101 Primary insomnia: Secondary | ICD-10-CM | POA: Diagnosis not present

## 2023-09-12 ENCOUNTER — Other Ambulatory Visit: Payer: Self-pay

## 2023-09-13 ENCOUNTER — Other Ambulatory Visit: Payer: Self-pay

## 2023-09-13 ENCOUNTER — Other Ambulatory Visit (HOSPITAL_COMMUNITY): Payer: Self-pay

## 2023-09-21 DIAGNOSIS — M810 Age-related osteoporosis without current pathological fracture: Secondary | ICD-10-CM | POA: Diagnosis not present

## 2023-10-02 DIAGNOSIS — R339 Retention of urine, unspecified: Secondary | ICD-10-CM | POA: Diagnosis not present

## 2023-10-05 ENCOUNTER — Other Ambulatory Visit: Payer: Self-pay

## 2023-10-05 DIAGNOSIS — I10 Essential (primary) hypertension: Secondary | ICD-10-CM | POA: Diagnosis not present

## 2023-10-05 DIAGNOSIS — N390 Urinary tract infection, site not specified: Secondary | ICD-10-CM | POA: Diagnosis not present

## 2023-10-05 NOTE — Progress Notes (Signed)
 Specialty Pharmacy Refill Coordination Note  Erin Roth is a 89 y.o. female contacted today regarding refills of specialty medication(s) Romosozumab -aqqg (Evenity )   Patient requested Delivery   Delivery date: 10/16/23   Verified address: [facility] at [place], [place], [place]   Medication will be filled on 10/15/23.

## 2023-10-11 DIAGNOSIS — H43813 Vitreous degeneration, bilateral: Secondary | ICD-10-CM | POA: Diagnosis not present

## 2023-10-11 DIAGNOSIS — H35722 Serous detachment of retinal pigment epithelium, left eye: Secondary | ICD-10-CM | POA: Diagnosis not present

## 2023-10-11 DIAGNOSIS — H26491 Other secondary cataract, right eye: Secondary | ICD-10-CM | POA: Diagnosis not present

## 2023-10-11 DIAGNOSIS — H353221 Exudative age-related macular degeneration, left eye, with active choroidal neovascularization: Secondary | ICD-10-CM | POA: Diagnosis not present

## 2023-10-11 DIAGNOSIS — H353132 Nonexudative age-related macular degeneration, bilateral, intermediate dry stage: Secondary | ICD-10-CM | POA: Diagnosis not present

## 2023-10-15 ENCOUNTER — Other Ambulatory Visit: Payer: Self-pay

## 2023-10-16 ENCOUNTER — Other Ambulatory Visit: Payer: Self-pay

## 2023-10-26 DIAGNOSIS — M810 Age-related osteoporosis without current pathological fracture: Secondary | ICD-10-CM | POA: Diagnosis not present

## 2023-10-31 DIAGNOSIS — R339 Retention of urine, unspecified: Secondary | ICD-10-CM | POA: Diagnosis not present

## 2023-11-07 ENCOUNTER — Other Ambulatory Visit: Payer: Self-pay

## 2023-11-09 ENCOUNTER — Other Ambulatory Visit: Payer: Self-pay | Admitting: Pharmacy Technician

## 2023-11-09 ENCOUNTER — Other Ambulatory Visit: Payer: Self-pay

## 2023-11-09 NOTE — Progress Notes (Signed)
 Specialty Pharmacy Refill Coordination Note  Erin Roth is a 89 y.o. female contacted today regarding refills of specialty medication(s) Romosozumab -aqqg (Evenity )   Patient requested Delivery   Delivery date: 11/20/23   Verified address: [facility] at [place]   Medication will be filled on 11/19/23.

## 2023-11-19 ENCOUNTER — Other Ambulatory Visit: Payer: Self-pay

## 2023-11-26 DIAGNOSIS — M810 Age-related osteoporosis without current pathological fracture: Secondary | ICD-10-CM | POA: Diagnosis not present

## 2023-11-29 DIAGNOSIS — H353132 Nonexudative age-related macular degeneration, bilateral, intermediate dry stage: Secondary | ICD-10-CM | POA: Diagnosis not present

## 2023-11-29 DIAGNOSIS — H43813 Vitreous degeneration, bilateral: Secondary | ICD-10-CM | POA: Diagnosis not present

## 2023-11-29 DIAGNOSIS — H35722 Serous detachment of retinal pigment epithelium, left eye: Secondary | ICD-10-CM | POA: Diagnosis not present

## 2023-11-29 DIAGNOSIS — H26491 Other secondary cataract, right eye: Secondary | ICD-10-CM | POA: Diagnosis not present

## 2023-11-29 DIAGNOSIS — H353221 Exudative age-related macular degeneration, left eye, with active choroidal neovascularization: Secondary | ICD-10-CM | POA: Diagnosis not present

## 2023-12-03 DIAGNOSIS — R339 Retention of urine, unspecified: Secondary | ICD-10-CM | POA: Diagnosis not present

## 2023-12-11 DIAGNOSIS — N390 Urinary tract infection, site not specified: Secondary | ICD-10-CM | POA: Diagnosis not present

## 2023-12-11 DIAGNOSIS — R030 Elevated blood-pressure reading, without diagnosis of hypertension: Secondary | ICD-10-CM | POA: Diagnosis not present

## 2023-12-12 ENCOUNTER — Other Ambulatory Visit: Payer: Self-pay

## 2023-12-12 NOTE — Progress Notes (Signed)
 Specialty Pharmacy Refill Coordination Note  Erin Roth is a 89 y.o. female contacted today regarding refills of specialty medication(s) Romosozumab -aqqg (Evenity )   Patient requested Delivery   Delivery date: 12/19/23   Verified address: [facility] at [place]   Medication will be filled on: 12/18/23

## 2023-12-18 ENCOUNTER — Other Ambulatory Visit: Payer: Self-pay

## 2023-12-26 DIAGNOSIS — M810 Age-related osteoporosis without current pathological fracture: Secondary | ICD-10-CM | POA: Diagnosis not present

## 2024-01-09 ENCOUNTER — Other Ambulatory Visit: Payer: Self-pay

## 2024-01-10 ENCOUNTER — Other Ambulatory Visit: Payer: Self-pay

## 2024-01-11 ENCOUNTER — Other Ambulatory Visit: Payer: Self-pay

## 2024-01-11 ENCOUNTER — Other Ambulatory Visit (HOSPITAL_COMMUNITY): Payer: Self-pay

## 2024-01-11 NOTE — Progress Notes (Signed)
 Specialty Pharmacy Refill Coordination Note  [name] is a 89 y.o. female assessed today regarding refills of clinic administered specialty medication(s) Romosozumab -aqqg (Evenity )   Clinic requested Courier to Provider Office   Delivery date: 01/17/24   Verified address: [facility] at [place]   Medication will be filled on: 01/16/24

## 2024-02-08 ENCOUNTER — Telehealth: Payer: Self-pay

## 2024-02-08 ENCOUNTER — Other Ambulatory Visit: Payer: Self-pay

## 2024-02-08 NOTE — Telephone Encounter (Signed)
 Pharmacy Patient Advocate Encounter   Received notification from Pt Calls Messages that prior authorization for Evenity  is required/requested.   Insurance verification completed.   The patient is insured through [facility] AD[employer].   Per test claim: PA required; PA submitted to above mentioned insurance via Latent Key/confirmation #/EOC [id number] Status is pending

## 2024-02-11 ENCOUNTER — Other Ambulatory Visit: Payer: Self-pay

## 2024-02-12 ENCOUNTER — Other Ambulatory Visit: Payer: Self-pay

## 2024-02-12 NOTE — Progress Notes (Signed)
 PA approved. Test claim documented in another encounter. Copay is $1,088.91. Patient is eligible for medicare payment plan.   Spoke with patient to discuss high copay. Office already has a dose on file so she does not need filled this month.   Will discuss options further next month if copay is still high.

## 2024-02-12 NOTE — Telephone Encounter (Signed)
 Pharmacy Patient Advocate Encounter  Received notification from [employer] that Prior Authorization for Evenity  has been APPROVED from 02/11/24 to 01/29/25. Ran test claim, Copay is $1,088.91. This test claim was processed through [facility]- copay amounts may vary at other pharmacies due to pharmacy/plan contracts, or as the patient moves through the different stages of their insurance plan.   PA #/Case ID/Reference #: [id number]

## 2024-02-14 ENCOUNTER — Other Ambulatory Visit: Payer: Self-pay
